# Patient Record
Sex: Female | Born: 1935 | Race: White | Hispanic: No | Smoking: Never smoker
Health system: Southern US, Community
[De-identification: ages and names within clinical notes are randomized; demographics above are authoritative.]

## PROBLEM LIST (undated history)

## (undated) DIAGNOSIS — I10 Essential (primary) hypertension: Secondary | ICD-10-CM

## (undated) DIAGNOSIS — F419 Anxiety disorder, unspecified: Secondary | ICD-10-CM

## (undated) DIAGNOSIS — C55 Malignant neoplasm of uterus, part unspecified: Secondary | ICD-10-CM

## (undated) DIAGNOSIS — E119 Type 2 diabetes mellitus without complications: Secondary | ICD-10-CM

## (undated) DIAGNOSIS — M1990 Unspecified osteoarthritis, unspecified site: Secondary | ICD-10-CM

## (undated) DIAGNOSIS — H269 Unspecified cataract: Secondary | ICD-10-CM

## (undated) DIAGNOSIS — I5181 Takotsubo syndrome: Secondary | ICD-10-CM

## (undated) HISTORY — DX: Takotsubo syndrome: I51.81

## (undated) HISTORY — DX: Unspecified osteoarthritis, unspecified site: M19.90

## (undated) HISTORY — PX: ABDOMINAL HYSTERECTOMY: SHX81

## (undated) HISTORY — DX: Type 2 diabetes mellitus without complications: E11.9

## (undated) HISTORY — DX: Anxiety disorder, unspecified: F41.9

## (undated) HISTORY — PX: APPENDECTOMY: SHX54

---

## 2001-10-01 ENCOUNTER — Ambulatory Visit (HOSPITAL_COMMUNITY): Admission: RE | Admit: 2001-10-01 | Discharge: 2001-10-01 | Payer: Self-pay | Admitting: Obstetrics and Gynecology

## 2001-10-01 ENCOUNTER — Encounter: Payer: Self-pay | Admitting: Unknown Physician Specialty

## 2001-10-28 ENCOUNTER — Encounter: Admission: RE | Admit: 2001-10-28 | Discharge: 2001-10-29 | Payer: Self-pay | Admitting: Unknown Physician Specialty

## 2001-12-09 ENCOUNTER — Encounter: Admission: RE | Admit: 2001-12-09 | Discharge: 2001-12-18 | Payer: Self-pay | Admitting: Unknown Physician Specialty

## 2002-08-05 ENCOUNTER — Encounter: Payer: Self-pay | Admitting: Unknown Physician Specialty

## 2002-08-05 ENCOUNTER — Ambulatory Visit (HOSPITAL_COMMUNITY): Admission: RE | Admit: 2002-08-05 | Discharge: 2002-08-05 | Payer: Self-pay | Admitting: Unknown Physician Specialty

## 2004-04-20 ENCOUNTER — Ambulatory Visit: Payer: Self-pay | Admitting: Family Medicine

## 2005-06-12 DIAGNOSIS — I5181 Takotsubo syndrome: Secondary | ICD-10-CM

## 2005-06-12 HISTORY — DX: Takotsubo syndrome: I51.81

## 2006-03-13 ENCOUNTER — Ambulatory Visit: Payer: Self-pay | Admitting: Cardiology

## 2006-03-14 ENCOUNTER — Ambulatory Visit: Payer: Self-pay | Admitting: Cardiology

## 2006-03-14 ENCOUNTER — Observation Stay (HOSPITAL_COMMUNITY): Admission: RE | Admit: 2006-03-14 | Discharge: 2006-03-15 | Payer: Self-pay | Admitting: Cardiology

## 2006-04-05 ENCOUNTER — Ambulatory Visit: Payer: Self-pay | Admitting: Cardiology

## 2007-06-26 ENCOUNTER — Ambulatory Visit (HOSPITAL_COMMUNITY): Admission: RE | Admit: 2007-06-26 | Discharge: 2007-06-26 | Payer: Self-pay | Admitting: Neurosurgery

## 2007-07-24 ENCOUNTER — Ambulatory Visit (HOSPITAL_COMMUNITY): Admission: RE | Admit: 2007-07-24 | Discharge: 2007-07-24 | Payer: Self-pay | Admitting: Internal Medicine

## 2008-04-30 ENCOUNTER — Ambulatory Visit (HOSPITAL_COMMUNITY): Admission: RE | Admit: 2008-04-30 | Discharge: 2008-04-30 | Payer: Self-pay | Admitting: Internal Medicine

## 2009-04-22 ENCOUNTER — Ambulatory Visit (HOSPITAL_COMMUNITY): Admission: RE | Admit: 2009-04-22 | Discharge: 2009-04-22 | Payer: Self-pay | Admitting: Internal Medicine

## 2010-05-23 ENCOUNTER — Ambulatory Visit (HOSPITAL_COMMUNITY)
Admission: RE | Admit: 2010-05-23 | Discharge: 2010-05-23 | Payer: Self-pay | Source: Home / Self Care | Attending: Internal Medicine | Admitting: Internal Medicine

## 2010-07-03 ENCOUNTER — Encounter: Payer: Self-pay | Admitting: Internal Medicine
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
## 2010-10-28 NOTE — Assessment & Plan Note (Signed)
[facility]                            [facility]ICE NOTE   NAME:Villarreal, CORIANNA AVALLONE                     MRN:          [id number]  DATE:04/05/2006                            DOB:          1931/07/13    REASON FOR OFFICE VISIT:  Post-hospital followup.   [name] is a delightful 77 year old female, whom we saw here initially at  [facility] for evaluation of chest pain and abnormal cardiac markers,  subsequently diagnosed with Takotsubo cardiomyopathy.  She had a peak  troponin of 0.55 with normal total CPKs.  Coronary angiography revealed  normal coronary arteries, with an ejection fraction of 18%, with akinesis of  everything except the base.   Patient was discharged on Coreg and low-dose aspirin.  She had a followup  echocardiogram just 3 days ago, reviewed by Dr. Myrtis Ser, revealing  normalization of ejection fraction (60%), as well as normalization of the  wall motion.  Mild mitral and tricuspid regurgitation was noted.   Patient denies any symptoms of chest pain, dyspnea, PND, orthopnea, lower  extremity edema.  She also notes no complications of the right coronary  incision site.   MEDICATIONS:  1. Aspirin 81 q.d.  2. Carvedilol 3.125 b.i.d.   PHYSICAL EXAMINATION:  VITAL SIGNS:  Blood pressure 120/58, pulse 64,  regular weight 156.  GENERAL:  A 77 year old female, no apparent distress.  NECK:  Palpable carotid pulse without bruits.  LUNGS:  Clear to auscultation in all fields.  HEART:  Regular rate and rhythm (S1, S2), no murmurs, rubs or gallops.  EXTREMITIES:  Right groin stable with no ecchymosis, hematoma, or bruits on  auscultation; intact femoral and distal pulse.  NEURO:  No focal deficits.   IMPRESSION:  1. Status post Takotsubo cardiomyopathy.      a.     Peak troponin 0.55.      b.     Normal coronary arteries/EF 18% by coronary angiography.      c.     Normalization of ejection fraction by recent 2D  echocardiogram.  2. History of syncope.      a.     Most likely vasovagal.   PLAN:  Continue current medication regimen.  Schedule return clinic followup  in 6 months.     ______________________________  Rozell Searing, PA-C    ______________________________  Learta Codding, MD,FACC   GS/MedQ  DD: 04/05/2006  DT: 04/06/2006  Job #: [id number]   cc:   Lia Hopping

## 2011-01-13 ENCOUNTER — Emergency Department (HOSPITAL_COMMUNITY)
Admission: EM | Admit: 2011-01-13 | Discharge: 2011-01-14 | Disposition: A | Discharge status: Home / Self Care | Payer: Medicare Other | Attending: Emergency Medicine | Admitting: Emergency Medicine

## 2011-01-13 DIAGNOSIS — R0602 Shortness of breath: Secondary | ICD-10-CM | POA: Insufficient documentation

## 2011-01-13 DIAGNOSIS — F411 Generalized anxiety disorder: Secondary | ICD-10-CM | POA: Insufficient documentation

## 2011-01-14 ENCOUNTER — Encounter: Payer: Self-pay | Admitting: *Deleted

## 2011-01-14 MED ORDER — HYDROCODONE-ACETAMINOPHEN 5-325 MG PO TABS
1.0000 | ORAL_TABLET | Freq: Once | ORAL | Status: AC
  Administered 2011-01-14: 1 via ORAL
  Filled 2011-01-14: qty 1

## 2011-01-14 MED ORDER — ACETAMINOPHEN 325 MG PO TABS
325.0000 mg | ORAL_TABLET | Freq: Once | ORAL | Status: AC
  Administered 2011-01-14: 325 mg via ORAL
  Filled 2011-01-14: qty 1

## 2011-01-14 MED ORDER — ONDANSETRON 4 MG PO TBDP
ORAL_TABLET | ORAL | Status: AC
  Administered 2011-01-14: 4 mg
  Filled 2011-01-14: qty 1

## 2011-01-14 NOTE — ED Notes (Signed)
Pt was at hospice home visiting her husband and she was told that he was dying. Pt unable to speak or wouldn't speak to me. Pt very upset and states she just wants a shot to go to sleep. Since arrival family has found out that the pts husband has passed away.

## 2011-01-14 NOTE — ED Notes (Signed)
Pt resting.

## 2011-01-18 NOTE — ED Provider Notes (Signed)
History     CSN: [id number] Arrival date & time: 01/13/2011 11:52 PM  Chief Complaint  Patient presents with  . Weakness   HPI Comments: Patient seen at 0006. Patient was at Hospice visiting her husband who has metastatic cancer. He had been doing poorly over the last few days at home and was transferred to the Hospice facility yesterday. When she saw him she realized he was going to die soon and could not deal with watching that happen. She became lightheaded, felt like she was going to pass out. She was hyperventilating (per daughter), crying and collapsed. Patient states she is anxious, tired, and does not know what she will do when her husband dies.   Patient is a 77 y.o. female presenting with anxiety. The history is provided by a parent.  Anxiety This is a new problem. The current episode started less than 1 hour ago. The problem has not changed since onset.Associated symptoms include shortness of breath. Pertinent negatives include no chest pain, no abdominal pain and no headaches. The symptoms are aggravated by nothing. The symptoms are relieved by nothing. She has tried nothing for the symptoms.    History reviewed. No pertinent past medical history.  History reviewed. No pertinent past surgical history.  History reviewed. No pertinent family history.  History  Substance Use Topics  . Smoking status: Never Smoker   . Smokeless tobacco: Not on file  . Alcohol Use: No    OB History    Grav Para Term Preterm Abortions TAB SAB Ect Mult Living                  Review of Systems  Constitutional:       Anxious, tearful  Respiratory: Positive for shortness of breath.   Cardiovascular: Negative for chest pain.  Gastrointestinal: Negative for abdominal pain.  Neurological: Negative for headaches.  Psychiatric/Behavioral: Negative for sleep disturbance.       Anxious, tearful  All other systems reviewed and are negative.    Physical Exam  BP 128/77  Pulse 86   Temp(Src) 98.5 F (36.9 C) (Oral)  Resp 16  SpO2 94%  Physical Exam  Constitutional: She is oriented to person, place, and time. She appears well-developed and well-nourished. She appears distressed.       Anxious tearful woman  HENT:  Head: Normocephalic and atraumatic.  Mouth/Throat: Oropharynx is clear and moist.  Eyes: EOM are normal.  Neck: Normal range of motion. Neck supple.  Cardiovascular: Normal rate and normal heart sounds.   Pulmonary/Chest: Effort normal.  Abdominal: Soft.  Musculoskeletal: Normal range of motion.  Neurological: She is alert and oriented to person, place, and time.  Skin: Skin is warm and dry.  Psychiatric:       Anxious, tearful    ED Course  Procedures  MDM 0015 Advised by family members hat patient's husband had died at Hospice. They have asked that I be the one to tell the patient. 0018 At the bedside with two daughters, I advised the patient that her husband had died. She was appropriately tearful. Talked with patient and daughters. Agreed patient could stay in the ER overnight rather than go home to her house. Patient states it would be the only sleep she has had in several days. Arranged with family to pick her up in the morning. Coordinated with nursing to place a sign on the door "do not disturb without speaking with MD". Patient was settled, given fluids, anxiolytic. 0600 Patient has awaked after  sleeping since shortly after midnight. Spent time at the bedside answering questions re how to accomodate for her recent loss. She will have breakfast and family will be here to take her to the funeral home. 0700 Daughter here to take patient home. Patient is stable.      Nicoletta Dress. Colon Branch, MD 01/18/11 1155

## 2011-06-29 ENCOUNTER — Encounter: Payer: Self-pay | Admitting: Family Medicine

## 2011-06-29 ENCOUNTER — Ambulatory Visit (INDEPENDENT_AMBULATORY_CARE_PROVIDER_SITE_OTHER): Payer: Medicare Other | Admitting: Family Medicine

## 2011-06-29 DIAGNOSIS — E559 Vitamin D deficiency, unspecified: Secondary | ICD-10-CM | POA: Insufficient documentation

## 2011-06-29 DIAGNOSIS — R32 Unspecified urinary incontinence: Secondary | ICD-10-CM

## 2011-06-29 DIAGNOSIS — L309 Dermatitis, unspecified: Secondary | ICD-10-CM | POA: Insufficient documentation

## 2011-06-29 DIAGNOSIS — D649 Anemia, unspecified: Secondary | ICD-10-CM | POA: Insufficient documentation

## 2011-06-29 DIAGNOSIS — I428 Other cardiomyopathies: Secondary | ICD-10-CM

## 2011-06-29 DIAGNOSIS — L259 Unspecified contact dermatitis, unspecified cause: Secondary | ICD-10-CM

## 2011-06-29 DIAGNOSIS — F411 Generalized anxiety disorder: Secondary | ICD-10-CM

## 2011-06-29 DIAGNOSIS — I5181 Takotsubo syndrome: Secondary | ICD-10-CM | POA: Insufficient documentation

## 2011-06-29 DIAGNOSIS — F419 Anxiety disorder, unspecified: Secondary | ICD-10-CM

## 2011-06-29 MED ORDER — TRIAMCINOLONE ACETONIDE 0.1 % EX CREA: TOPICAL_CREAM | Freq: Two times a day (BID) | CUTANEOUS | Status: DC

## 2011-06-29 NOTE — Patient Instructions (Addendum)
I will get your records from Dr. Robynn Pane Continue your current medications Get your labs done before our next visit - do not eat after midnight  Use the cream for your skin  Do not take very hot baths or showers F/U in 6 weeks

## 2011-06-29 NOTE — Assessment & Plan Note (Signed)
Takotsubo Cardiomyopathy noted in 2007, patient was maintained on beta blocker that time. She states that she just stopped this medication because she didn't need it anymore. She's not in a cardiology since then. She's not had chest pain since then. She did have a normal catheterization in 2007. I'll check her baseline labs as well as a fasting lipid panel at her request.

## 2011-06-29 NOTE — Assessment & Plan Note (Signed)
Vitamin D level to be drawn

## 2011-06-29 NOTE — Assessment & Plan Note (Signed)
Patient is currently on when necessary ativan which she rarely uses. I asked him she was started on Paxil secondary to her anxiety and depression after her husband died. She is discontinued this some time ago will monitor

## 2011-06-29 NOTE — Progress Notes (Signed)
  Subjective:    Patient ID: Debra Villarreal, female    DOB: [birth year], 78 y.o.   MRN: [id number]  HPI   Chronic pruritis x 2 months, itches on back , this started a week after her husband died. She thinks she has a rash on her back it was initially concern it was shingles. She states she was given a shot and medication from her previous PCP did but does not know what these medications were.   Anemia- no current medciation    Vitamin D level deficinecy- was on Vitamin D has not had levels checked recently   Was started on Paxil ( patient unsure why she was started on this) - stopped secondary to change in mentation, mood swings   Incontinence- currently on Vesicare, history of bladder prolapse,    Arthritis- currently taking Meloxicam  Anxiety- takes prn lorazepam when "her nerves are bad"  Patient requests lipid panel  Mammogram last year   Review of Systems GEN- denies fatigue, fever, weight loss,weakness, recent illness HEENT- denies eye drainage, change in vision, nasal discharge, CVS- denies chest pain, palpitations RESP- denies SOB, cough, wheeze ABD- denies N/V, change in stools, abd pain GU- denies dysuria, hematuria, dribbling, +incontinence MSK- +joint pain, denies muscle aches, injury Neuro- denies headache, dizziness, syncope, seizure activity Psych- denies sadness, depression      Objective:   Physical Exam GEN- NAD, alert and oriented x3, walks unassisted  HEENT- PERRL, EOMI, non injected sclera, pink conjunctiva, MMM, oropharynx clear, edentulous Neck- Supple, no thryomegaly, no carotid bruit CVS- RRR, no murmur RESP-CTAB ABD- NABS,soft, NT, ND EXT- No edema Pulses- Radial, DP- 2+ Skin- erythema across back, with dry, eczematous scattered lesions, no pustules, no papules noted, no hives Psych- not depressed or anxious      Assessment & Plan:

## 2011-06-29 NOTE — Assessment & Plan Note (Signed)
History of anemia. Will obtain CBC. Reiterated to patient that pending the results of her labs she may or may not be supplement with iron or B12

## 2011-06-29 NOTE — Assessment & Plan Note (Signed)
Eczematous rash on back. Will try steroid mix with Eucerin cream.

## 2011-06-29 NOTE — Assessment & Plan Note (Signed)
Patient has overactive bladder as well as stress incontinence. She will continue her Vesicare.

## 2011-07-11 LAB — BASIC METABOLIC PANEL
CO2: 28 mEq/L (ref 19–32)
Chloride: 103 mEq/L (ref 96–112)
Creat: 0.73 mg/dL (ref 0.50–1.10)
Glucose, Bld: 115 mg/dL — ABNORMAL HIGH (ref 70–99)
Sodium: 139 mEq/L (ref 135–145)

## 2011-07-11 LAB — CBC
Hemoglobin: 13.3 g/dL (ref 12.0–15.0)
MCH: 28.7 pg (ref 26.0–34.0)
MCV: 90.5 fL (ref 78.0–100.0)
Platelets: 216 10*3/uL (ref 150–400)
RBC: 4.64 MIL/uL (ref 3.87–5.11)
WBC: 6.1 10*3/uL (ref 4.0–10.5)

## 2011-07-11 LAB — LIPID PANEL
HDL: 45 mg/dL (ref >39)
LDL Cholesterol: 107 mg/dL — ABNORMAL HIGH (ref 0–99)
Total CHOL/HDL Ratio: 4.2 Ratio

## 2011-07-13 LAB — VITAMIN D 1,25 DIHYDROXY: Vitamin D 1, 25 (OH)2 Total: 49 pg/mL (ref 18–72)

## 2011-08-10 ENCOUNTER — Encounter: Payer: Self-pay | Admitting: Family Medicine

## 2011-08-10 ENCOUNTER — Ambulatory Visit (INDEPENDENT_AMBULATORY_CARE_PROVIDER_SITE_OTHER): Payer: Medicare Other | Admitting: Family Medicine

## 2011-08-10 DIAGNOSIS — F419 Anxiety disorder, unspecified: Secondary | ICD-10-CM

## 2011-08-10 DIAGNOSIS — R32 Unspecified urinary incontinence: Secondary | ICD-10-CM

## 2011-08-10 DIAGNOSIS — L259 Unspecified contact dermatitis, unspecified cause: Secondary | ICD-10-CM

## 2011-08-10 DIAGNOSIS — F411 Generalized anxiety disorder: Secondary | ICD-10-CM

## 2011-08-10 DIAGNOSIS — D649 Anemia, unspecified: Secondary | ICD-10-CM

## 2011-08-10 DIAGNOSIS — I428 Other cardiomyopathies: Secondary | ICD-10-CM

## 2011-08-10 DIAGNOSIS — L309 Dermatitis, unspecified: Secondary | ICD-10-CM

## 2011-08-10 DIAGNOSIS — E781 Pure hyperglyceridemia: Secondary | ICD-10-CM | POA: Insufficient documentation

## 2011-08-10 MED ORDER — SOLIFENACIN SUCCINATE 5 MG PO TABS: 5.0000 mg | ORAL_TABLET | Freq: Every day | ORAL | Status: DC

## 2011-08-10 NOTE — Assessment & Plan Note (Signed)
Stable off medications for a few years now.

## 2011-08-10 NOTE — Assessment & Plan Note (Signed)
Patient has done well without use of any medication. We'll continue to follow.

## 2011-08-10 NOTE — Assessment & Plan Note (Signed)
Hemoglobin is stable. She does not need any medications.

## 2011-08-10 NOTE — Assessment & Plan Note (Signed)
Improved

## 2011-08-10 NOTE — Progress Notes (Signed)
  Subjective:    Patient ID: Debra Villarreal, female    DOB: 1931/08/07, 78 y.o.   MRN: [id number]  HPI Patient presents for medication refill and interim followup  Cardiomyopathy- it is noted in her past PCP has noticed she has not been on any cardiac medications including aspirin or beta blocker. She denies any recent chest pain ,difficulty breathing, leg edema.  Incontinent- she was seen by urology Dr. Frann Rider a few years ago. She was started on Vesicare at that time. She asked for this to be refilled. She is doing well on this medication. She does not want any intervention  Eczema- improved, using Jergens  Anxiety- no recent stress or anxious moments, has not taken ativan, has her dog to keep her company and she visits with her children often  Labs reviewed Review of Systems  GEN- denies fatigue, fever, weight loss,weakness, recent illness HEENT- denies eye drainage, change in vision, nasal discharge, CVS- denies chest pain, palpitations RESP- denies SOB, cough, wheeze ABD- denies N/V, change in stools, abd pain GU- denies dysuria, hematuria, dribbling, +incontinence MSK- +joint pain, denies muscle aches, injury Neuro- denies headache, dizziness, syncope, seizure activity Psych- denies sadness, depression     Objective:   Physical Exam  GEN- NAD, alert and oriented x3,  CVS- RRR, no murmur RESP-CTAB EXT- No edema Psych- not anxious or depressed appearing     Assessment & Plan:  Records to be obtained from the health department regarding her immunizations appear

## 2011-08-10 NOTE — Assessment & Plan Note (Signed)
Vesicare refilled.

## 2011-08-10 NOTE — Patient Instructions (Signed)
Continue your current medications Watch the fried foods  Calcium (1200mg ) and Vitamin D (800IU)- vitamin  If you take Omega 3- Fatty acids (fish oil) take 1000mg  a day  F/U 4 months

## 2011-08-10 NOTE — Assessment & Plan Note (Signed)
Mild elevation in triglycerides. Patient does not need any medications. She states she will avoid any fried foods and she is avoiding meats

## 2011-09-26 ENCOUNTER — Other Ambulatory Visit: Payer: Self-pay

## 2011-09-26 ENCOUNTER — Telehealth: Payer: Self-pay | Admitting: Family Medicine

## 2011-09-26 MED ORDER — MELOXICAM 15 MG PO TABS: 15.0000 mg | ORAL_TABLET | Freq: Every day | ORAL | Status: DC

## 2011-09-26 NOTE — Telephone Encounter (Signed)
Med sent.

## 2011-10-04 NOTE — Patient Instructions (Addendum)
20 Debra Villarreal  10/04/2011   Your procedure is scheduled on:  5.2.13  Report to [facility] at 1100 AM.  Call this number if you have problems the morning of surgery: [phone]   Remember:   Do not eat food:After Midnight.  May have clear liquids:until Midnight .  Clear liquids include soda, tea, black coffee, apple or grape juice, broth.  Take these medicines the morning of surgery with A SIP OF WATER: ativan, vesicare   Do not wear jewelry, make-up or nail polish.  Do not wear lotions, powders, or perfumes. You may wear deodorant.  Do not shave 48 hours prior to surgery.  Do not bring valuables to the hospital.  Contacts, dentures or bridgework may not be worn into surgery.  Leave suitcase in the car. After surgery it may be brought to your room.  For patients admitted to the hospital, checkout time is 11:00 AM the day of discharge.   Patients discharged the day of surgery will not be allowed to drive home.  Name and phone number of your driver: family  Special Instructions: N/A   Please read over the following fact sheets that you were given: Pain Booklet, Surgical Site Infection Prevention, Anesthesia Post-op Instructions and Care and Recovery After Surgery   PATIENT INSTRUCTIONS POST-ANESTHESIA  IMMEDIATELY FOLLOWING SURGERY:  Do not drive or operate machinery for the first twenty four hours after surgery.  Do not make any important decisions for twenty four hours after surgery or while taking narcotic pain medications or sedatives.  If you develop intractable nausea and vomiting or a severe headache please notify your doctor immediately.  FOLLOW-UP:  Please make an appointment with your surgeon as instructed. You do not need to follow up with anesthesia unless specifically instructed to do so.  WOUND CARE INSTRUCTIONS (if applicable):  Keep a dry clean dressing on the anesthesia/puncture wound site if there is drainage.  Once the wound has quit draining you may leave it  open to air.  Generally you should leave the bandage intact for twenty four hours unless there is drainage.  If the epidural site drains for more than 36-48 hours please call the anesthesia department.  QUESTIONS?:  Please feel free to call your physician or the hospital operator if you have any questions, and they will be happy to assist you.     [facility] Anesthesia Department [address] [place] [phone]    Cataract A cataract is a clouding of the lens of the eye. When a lens becomes cloudy, vision is reduced based on the degree and nature of the clouding. Many cataracts reduce vision to some degree. Some cataracts make people more near-sighted as they develop. Other cataracts increase glare. Cataracts that are ignored and become worse can sometimes look white. The white color can be seen through the pupil. CAUSES   Aging. However, cataracts may occur at any age, even in newborns.   Certain drugs.   Trauma to the eye.   Certain diseases such as diabetes.   Specific eye diseases such as chronic inflammation inside the eye or a sudden attack of a rare form of glaucoma.   Inherited or acquired medical problems.  SYMPTOMS   Gradual, progressive drop in vision in the affected eye.   Severe, rapid visual loss. This most often happens when trauma is the cause.  DIAGNOSIS  To detect a cataract, an eye doctor examines the lens. Cataracts are best diagnosed with an exam of the eyes  with the pupils enlarged (dilated) by drops.  TREATMENT  For an early cataract, vision may improve by using different eyeglasses or stronger lighting. If that does not help your vision, surgery is the only effective treatment. A cataract needs to be surgically removed when vision loss interferes with your everyday activities, such as driving, reading, or watching TV. A cataract may also have to be removed if it prevents examination or treatment of another eye problem. Surgery removes the  cloudy lens and usually replaces it with a substitute lens (intraocular lens, IOL).  At a time when both you and your doctor agree, the cataract will be surgically removed. If you have cataracts in both eyes, only one is usually removed at a time. This allows the operated eye to heal and be out of danger from any possible problems after surgery (such as infection or poor wound healing). In rare cases, a cataract may be doing damage to your eye. In these cases, your caregiver may advise surgical removal right away. The vast majority of people who have cataract surgery have better vision afterward. HOME CARE INSTRUCTIONS  If you are not planning surgery, you may be asked to do the following:  Use different eyeglasses.   Use stronger or brighter lighting.   Ask your eye doctor about reducing your medicine dose or changing medicines if it is thought that a medicine caused your cataract. Changing medicines does not make the cataract go away on its own.   Become familiar with your surroundings. Poor vision can lead to injury. Avoid bumping into things on the affected side. You are at a higher risk for tripping or falling.   Exercise extreme care when driving or operating machinery.   Wear sunglasses if you are sensitive to bright light or experiencing problems with glare.  SEEK IMMEDIATE MEDICAL CARE IF:   You have a worsening or sudden vision loss.   You notice redness, swelling, or increasing pain in the eye.   You have a fever.  Document Released: 05/29/2005 Document Revised: 05/18/2011 Document Reviewed: 01/20/2011 [facility] Patient Information 2012 [place], [place].

## 2011-10-05 ENCOUNTER — Encounter (HOSPITAL_COMMUNITY): Payer: Self-pay

## 2011-10-05 ENCOUNTER — Encounter (HOSPITAL_COMMUNITY): Payer: Self-pay | Admitting: Pharmacy Technician

## 2011-10-05 ENCOUNTER — Encounter (HOSPITAL_COMMUNITY)
Admission: RE | Admit: 2011-10-05 | Discharge: 2011-10-05 | Disposition: A | Discharge status: Home / Self Care | Payer: Medicare Other | Source: Ambulatory Visit | Attending: Ophthalmology | Admitting: Ophthalmology

## 2011-10-05 LAB — HEMOGLOBIN AND HEMATOCRIT, BLOOD
HCT: 38.7 % (ref 36.0–46.0)
Hemoglobin: 12.7 g/dL (ref 12.0–15.0)

## 2011-10-05 LAB — BASIC METABOLIC PANEL
BUN: 19 mg/dL (ref 6–23)
Creatinine, Ser: 0.83 mg/dL (ref 0.50–1.10)
GFR calc Af Amer: 77 mL/min — ABNORMAL LOW (ref >90)
GFR calc non Af Amer: 66 mL/min — ABNORMAL LOW (ref >90)

## 2011-10-11 MED ORDER — LIDOCAINE HCL 3.5 % OP GEL
OPHTHALMIC | Status: AC
  Administered 2011-10-12: 1 via OPHTHALMIC
  Filled 2011-10-11: qty 5

## 2011-10-11 MED ORDER — TETRACAINE HCL 0.5 % OP SOLN
OPHTHALMIC | Status: AC
  Administered 2011-10-12: 1 [drp] via OPHTHALMIC
  Filled 2011-10-11: qty 2

## 2011-10-11 MED ORDER — NEOMYCIN-POLYMYXIN-DEXAMETH 3.5-10000-0.1 OP OINT
TOPICAL_OINTMENT | OPHTHALMIC | Status: AC
  Filled 2011-10-11: qty 3.5

## 2011-10-12 ENCOUNTER — Encounter (HOSPITAL_COMMUNITY): Payer: Self-pay | Admitting: *Deleted

## 2011-10-12 ENCOUNTER — Ambulatory Visit (HOSPITAL_COMMUNITY): Payer: Medicare Other | Admitting: Anesthesiology

## 2011-10-12 ENCOUNTER — Encounter (HOSPITAL_COMMUNITY): Payer: Self-pay | Admitting: Anesthesiology

## 2011-10-12 ENCOUNTER — Ambulatory Visit (HOSPITAL_COMMUNITY)
Admission: RE | Admit: 2011-10-12 | Discharge: 2011-10-12 | Disposition: A | Discharge status: Home Health | Payer: Medicare Other | Source: Ambulatory Visit | Attending: Ophthalmology | Admitting: Ophthalmology

## 2011-10-12 ENCOUNTER — Encounter (HOSPITAL_COMMUNITY)
Admission: RE | Disposition: A | Discharge status: Home Health | Payer: Self-pay | Source: Ambulatory Visit | Attending: Ophthalmology

## 2011-10-12 DIAGNOSIS — Z01810 Encounter for preprocedural cardiovascular examination: Secondary | ICD-10-CM | POA: Insufficient documentation

## 2011-10-12 DIAGNOSIS — H2510 Age-related nuclear cataract, unspecified eye: Secondary | ICD-10-CM | POA: Insufficient documentation

## 2011-10-12 DIAGNOSIS — Z01812 Encounter for preprocedural laboratory examination: Secondary | ICD-10-CM | POA: Insufficient documentation

## 2011-10-12 HISTORY — PX: CATARACT EXTRACTION W/PHACO: SHX586

## 2011-10-12 SURGERY — PHACOEMULSIFICATION, CATARACT, WITH IOL INSERTION
Anesthesia: Monitor Anesthesia Care | Site: Eye | Laterality: Left | Wound class: Clean

## 2011-10-12 MED ORDER — MIDAZOLAM HCL 2 MG/2ML IJ SOLN
INTRAMUSCULAR | Status: AC
  Administered 2011-10-12: 2 mg via INTRAVENOUS
  Filled 2011-10-12: qty 2

## 2011-10-12 MED ORDER — LIDOCAINE HCL 3.5 % OP GEL
1.0000 "application " | Freq: Once | OPHTHALMIC | Status: AC
  Administered 2011-10-12: 1 via OPHTHALMIC

## 2011-10-12 MED ORDER — EPINEPHRINE HCL 1 MG/ML IJ SOLN
INTRAOCULAR | Status: DC | PRN
  Administered 2011-10-12: 13:00:00

## 2011-10-12 MED ORDER — LIDOCAINE HCL (PF) 1 % IJ SOLN
INTRAMUSCULAR | Status: AC
  Filled 2011-10-12: qty 2

## 2011-10-12 MED ORDER — LIDOCAINE 3.5 % OP GEL OPTIME - NO CHARGE
OPHTHALMIC | Status: DC | PRN
  Administered 2011-10-12: 1 [drp] via OPHTHALMIC

## 2011-10-12 MED ORDER — LACTATED RINGERS IV SOLN
INTRAVENOUS | Status: DC | PRN
  Administered 2011-10-12: 12:00:00 via INTRAVENOUS

## 2011-10-12 MED ORDER — EPINEPHRINE HCL 1 MG/ML IJ SOLN
INTRAMUSCULAR | Status: AC
  Filled 2011-10-12: qty 1

## 2011-10-12 MED ORDER — MIDAZOLAM HCL 2 MG/2ML IJ SOLN
INTRAMUSCULAR | Status: AC
  Filled 2011-10-12: qty 2

## 2011-10-12 MED ORDER — PHENYLEPHRINE HCL 2.5 % OP SOLN
1.0000 [drp] | OPHTHALMIC | Status: AC
  Administered 2011-10-12 (×3): 1 [drp] via OPHTHALMIC

## 2011-10-12 MED ORDER — BSS IO SOLN
INTRAOCULAR | Status: DC | PRN
  Administered 2011-10-12: 15 mL via INTRAOCULAR

## 2011-10-12 MED ORDER — MIDAZOLAM HCL 2 MG/2ML IJ SOLN: 1.0000 mg | INTRAMUSCULAR | Status: DC | PRN

## 2011-10-12 MED ORDER — NEOMYCIN-POLYMYXIN-DEXAMETH 0.1 % OP OINT
TOPICAL_OINTMENT | OPHTHALMIC | Status: DC | PRN
  Administered 2011-10-12: 1 via OPHTHALMIC

## 2011-10-12 MED ORDER — PROVISC 10 MG/ML IO SOLN
INTRAOCULAR | Status: DC | PRN
  Administered 2011-10-12: 8.5 mg via INTRAOCULAR

## 2011-10-12 MED ORDER — LIDOCAINE HCL (PF) 1 % IJ SOLN
INTRAMUSCULAR | Status: DC | PRN
  Administered 2011-10-12: .7 mL

## 2011-10-12 MED ORDER — LACTATED RINGERS IV SOLN
INTRAVENOUS | Status: DC
  Administered 2011-10-12: 1000 mL via INTRAVENOUS

## 2011-10-12 MED ORDER — POVIDONE-IODINE 5 % OP SOLN
OPHTHALMIC | Status: DC | PRN
  Administered 2011-10-12: 1 via OPHTHALMIC

## 2011-10-12 MED ORDER — CYCLOPENTOLATE-PHENYLEPHRINE 0.2-1 % OP SOLN
OPHTHALMIC | Status: AC
  Administered 2011-10-12: 1 [drp] via OPHTHALMIC
  Filled 2011-10-12: qty 2

## 2011-10-12 MED ORDER — PHENYLEPHRINE HCL 2.5 % OP SOLN
OPHTHALMIC | Status: AC
  Administered 2011-10-12: 1 [drp] via OPHTHALMIC
  Filled 2011-10-12: qty 2

## 2011-10-12 MED ORDER — CYCLOPENTOLATE-PHENYLEPHRINE 0.2-1 % OP SOLN
1.0000 [drp] | OPHTHALMIC | Status: AC
  Administered 2011-10-12 (×3): 1 [drp] via OPHTHALMIC

## 2011-10-12 MED ORDER — MIDAZOLAM HCL 2 MG/2ML IJ SOLN
1.0000 mg | INTRAMUSCULAR | Status: DC | PRN
  Administered 2011-10-12 (×2): 2 mg via INTRAVENOUS

## 2011-10-12 MED ORDER — TETRACAINE HCL 0.5 % OP SOLN
1.0000 [drp] | OPHTHALMIC | Status: AC
  Administered 2011-10-12 (×3): 1 [drp] via OPHTHALMIC

## 2011-10-12 MED ORDER — LACTATED RINGERS IV SOLN: INTRAVENOUS | Status: DC

## 2011-10-12 SURGICAL SUPPLY — 30 items
CAPSULAR TENSION RING-AMO (OPHTHALMIC RELATED) IMPLANT
CLOTH BEACON ORANGE TIMEOUT ST (SAFETY) ×1 IMPLANT
EYE SHIELD UNIVERSAL CLEAR (GAUZE/BANDAGES/DRESSINGS) ×2 IMPLANT
GLOVE BIO SURGEON STRL SZ 6.5 (GLOVE) IMPLANT
GLOVE BIOGEL PI IND STRL 6.5 (GLOVE) IMPLANT
GLOVE BIOGEL PI IND STRL 7.0 (GLOVE) ×1 IMPLANT
GLOVE BIOGEL PI IND STRL 7.5 (GLOVE) IMPLANT
GLOVE BIOGEL PI INDICATOR 6.5 (GLOVE) ×1
GLOVE BIOGEL PI INDICATOR 7.0 (GLOVE) ×1
GLOVE BIOGEL PI INDICATOR 7.5 (GLOVE)
GLOVE ECLIPSE 6.5 STRL STRAW (GLOVE) IMPLANT
GLOVE ECLIPSE 7.0 STRL STRAW (GLOVE) IMPLANT
GLOVE ECLIPSE 7.5 STRL STRAW (GLOVE) IMPLANT
GLOVE EXAM NITRILE LRG STRL (GLOVE) IMPLANT
GLOVE EXAM NITRILE MD LF STRL (GLOVE) IMPLANT
GLOVE SKINSENSE NS SZ6.5 (GLOVE)
GLOVE SKINSENSE NS SZ7.0 (GLOVE)
GLOVE SKINSENSE STRL SZ6.5 (GLOVE) IMPLANT
GLOVE SKINSENSE STRL SZ7.0 (GLOVE) IMPLANT
KIT VITRECTOMY (OPHTHALMIC RELATED) IMPLANT
PAD ARMBOARD 7.5X6 YLW CONV (MISCELLANEOUS) ×2 IMPLANT
PROC W NO LENS (INTRAOCULAR LENS)
PROC W SPEC LENS (INTRAOCULAR LENS)
PROCESS W NO LENS (INTRAOCULAR LENS) IMPLANT
PROCESS W SPEC LENS (INTRAOCULAR LENS) IMPLANT
RING MALYGIN (MISCELLANEOUS) IMPLANT
SIGHTPATH CAT PROC W REG LENS (Ophthalmic Related) ×2 IMPLANT
SYR TB 1ML LL NO SAFETY (SYRINGE) ×1 IMPLANT
VISCOELASTIC ADDITIONAL (OPHTHALMIC RELATED) IMPLANT
WATER STERILE IRR 250ML POUR (IV SOLUTION) ×1 IMPLANT

## 2011-10-12 NOTE — Op Note (Signed)
NAMESHAQUIRA, MOROZ NO.:  [id number]  MEDICAL RECORD NO.:  [id number]  LOCATION:  [facility]                          FACILITY:  [facility]  PHYSICIAN:  Susanne Greenhouse, MD       DATE OF BIRTH:  1931/11/07  DATE OF PROCEDURE:  10/12/2011 DATE OF DISCHARGE:  10/12/2011                              OPERATIVE REPORT   PREOPERATIVE DIAGNOSIS:  Nuclear cataract, left eye, diagnosis code 366.16.  POSTOPERATIVE DIAGNOSIS:  Nuclear cataract, left eye, diagnosis code 366.16.  OPERATION PERFORMED:  Phacoemulsification with posterior chamber intraocular lens implantation, left eye.  SURGEON:  Susanne Greenhouse, MD  ANESTHESIA:  Local with MAC.  OPERATIVE SUMMARY:  In the preoperative area, dilating drops were placed into the left eye.  The patient was then brought into the operating room where she was placed under general anesthesia.  The eye was then prepped and draped.  Beginning with a #75 blade, a paracentesis port was made at the surgeon's 2 o'clock position.  The anterior chamber was then filled with a 1% nonpreserved lidocaine solution with epinephrine.  This was followed by Viscoat to deepen the chamber.  A small fornix-based peritomy was performed superiorly.  Next, a single iris hook was placed through the limbus superiorly.  A 2.4-mm keratome blade was then used to make a clear corneal incision over the iris hook.  A bent cystotome needle and Utrata forceps were used to create a continuous tear capsulotomy.  Hydrodissection was performed using balanced salt solution on a fine cannula.  The lens nucleus was then removed using phacoemulsification in a quadrant cracking technique.  The cortical material was then removed with irrigation and aspiration.  The capsular bag and anterior chamber were refilled with Provisc.  The wound was widened to approximately 3 mm and a posterior chamber intraocular lens was placed into the capsular bag without difficulty using an [employer]  lens injecting system.  A single 10-0 nylon suture was then used to close the incision as well as stromal hydration.  The Provisc was removed from the anterior chamber and capsular bag with irrigation and aspiration.  At this point, the wounds were tested for leak, which were negative.  The anterior chamber remained deep and stable.  The patient tolerated the procedure well.  There were no operative complications, and she awoke from general anesthesia without problem.  No surgical specimens.  Prosthetic device used is a Lenstec posterior chamber lens, model Softec HD, power of 20.5, serial number is [id number].          ______________________________ Susanne Greenhouse, MD     KEH/MEDQ  D:  10/12/2011  T:  10/12/2011  Job:  [id number]

## 2011-10-12 NOTE — Anesthesia Postprocedure Evaluation (Signed)
  Anesthesia Post-op Note  Patient: Debra Villarreal  Procedure(s) Performed: Procedure(s) (LRB): CATARACT EXTRACTION PHACO AND INTRAOCULAR LENS PLACEMENT (IOC) (Left)  Patient Location: PACU and Short Stay  Anesthesia Type: MAC  Level of Consciousness: awake, alert  and oriented  Airway and Oxygen Therapy: Patient Spontanous Breathing  Post-op Pain: none  Post-op Assessment: Post-op Vital signs reviewed, Patient's Cardiovascular Status Stable, Respiratory Function Stable, Patent Airway, No signs of Nausea or vomiting and Pain level controlled  Post-op Vital Signs: Reviewed and stable  Complications: No apparent anesthesia complications

## 2011-10-12 NOTE — Transfer of Care (Signed)
Immediate Anesthesia Transfer of Care Note  Patient: Debra Villarreal  Procedure(s) Performed: Procedure(s) (LRB): CATARACT EXTRACTION PHACO AND INTRAOCULAR LENS PLACEMENT (IOC) (Left)  Patient Location: PACU and Short Stay  Anesthesia Type: MAC  Level of Consciousness: awake, alert  and oriented  Airway & Oxygen Therapy: Patient Spontanous Breathing  Post-op Assessment: Report given to PACU RN  Post vital signs: stable  Complications: No apparent anesthesia complications

## 2011-10-12 NOTE — H&P (Signed)
I have reviewed the H&P, the patient was re-examined, and I have identified no interval changes in medical condition and plan of care since the history and physical of record  

## 2011-10-12 NOTE — Preoperative (Signed)
Beta Blockers   Reason not to administer Beta Blockers:Not Applicable 

## 2011-10-12 NOTE — Brief Op Note (Signed)
Pre-Op Dx: Cataract OS Post-Op Dx: Cataract OS Surgeon: Tasheema Perrone Anesthesia: Topical with MAC Surgery: Cataract Extraction with Intraocular lens Implant OS Implant: Lenstec, Model Softec HD Specimen: None Complications: None 

## 2011-10-12 NOTE — Discharge Instructions (Signed)
Debra Villarreal  10/12/2011     Instructions  1. Use medications as Instructed.  Shake well before use. Wait 5 minutes between drops.  {OPHTHALMIC ANTIBIOTICS:22167} 4 times a day x 1 week.  {OPHTHALMIC ANTI-INFLAMMATORY:22168} 2 times a day x 4 weeks.  {OPHTHALMIC STEROID:22169} 4 times a day - week 1   3 times a day - Week 2, 2 times a day- Week 3, 1 time a day - Week 4.  2. Do not rub the operative eye. Do not swim underwater for 2 weeks.  3. You may remove the clear shield and resume your normal activities the day after  Surgery. Your eyes may feel more comfortable if you wear dark glasses outside.  4. Call our office at [phone] if you have sudden change in vision, extreme redness or pain. Some fluctuation in vision is normal after surgery. If you have an emergency after hours, call Dr. Alto Denver at [phone].  5. It is important that you attend all of your follow-up appointments.        Follow-up:{follow up:32580} with [name], MD.   Dr. Lahoma Crocker: [phone]  Dr. Lita Mains: [phone]  Dr. Alto Denver: [phone]   If you find that you cannot contact your physician, but feel that your signs and   Symptoms warrant a physician's attention, call the Emergency Room at   [phone].   Other{NA AND AYTKZSWF:09323}.

## 2011-10-12 NOTE — Anesthesia Preprocedure Evaluation (Signed)
Anesthesia Evaluation  Patient identified by MRN, date of birth, ID band Patient awake    Reviewed: Allergy & Precautions, H&P , NPO status , Patient's Chart, lab work & pertinent test results  Airway Mallampati: II      Dental  (+) Edentulous Upper and Edentulous Lower   Pulmonary neg pulmonary ROS,  breath sounds clear to auscultation        Cardiovascular negative cardio ROS  Rhythm:Regular     Neuro/Psych    GI/Hepatic   Endo/Other    Renal/GU      Musculoskeletal   Abdominal   Peds  Hematology   Anesthesia Other Findings   Reproductive/Obstetrics                           Anesthesia Physical Anesthesia Plan  ASA: II  Anesthesia Plan: MAC   Post-op Pain Management:    Induction: Intravenous  Airway Management Planned: Nasal Cannula  Additional Equipment:   Intra-op Plan:   Post-operative Plan:   Informed Consent: I have reviewed the patients History and Physical, chart, labs and discussed the procedure including the risks, benefits and alternatives for the proposed anesthesia with the patient or authorized representative who has indicated his/her understanding and acceptance.     Plan Discussed with:   Anesthesia Plan Comments:         Anesthesia Quick Evaluation

## 2011-10-16 ENCOUNTER — Encounter (HOSPITAL_COMMUNITY): Payer: Self-pay | Admitting: Ophthalmology

## 2011-10-19 ENCOUNTER — Encounter (HOSPITAL_COMMUNITY)
Admission: RE | Admit: 2011-10-19 | Discharge: 2011-10-19 | Payer: Medicare Other | Source: Ambulatory Visit | Admitting: Ophthalmology

## 2011-10-19 ENCOUNTER — Encounter (HOSPITAL_COMMUNITY): Payer: Self-pay

## 2011-10-19 MED ORDER — ONDANSETRON HCL 4 MG/2ML IJ SOLN: 4.0000 mg | Freq: Once | INTRAMUSCULAR | Status: DC | PRN

## 2011-10-19 MED ORDER — FENTANYL CITRATE 0.05 MG/ML IJ SOLN: 25.0000 ug | INTRAMUSCULAR | Status: DC | PRN

## 2011-10-20 MED ORDER — LIDOCAINE HCL 3.5 % OP GEL
OPHTHALMIC | Status: AC
  Filled 2011-10-20: qty 5

## 2011-10-20 MED ORDER — PHENYLEPHRINE HCL 2.5 % OP SOLN
OPHTHALMIC | Status: AC
  Filled 2011-10-20: qty 2

## 2011-10-20 MED ORDER — NEOMYCIN-POLYMYXIN-DEXAMETH 3.5-10000-0.1 OP OINT
TOPICAL_OINTMENT | OPHTHALMIC | Status: AC
  Filled 2011-10-20: qty 3.5

## 2011-10-20 MED ORDER — CYCLOPENTOLATE-PHENYLEPHRINE 0.2-1 % OP SOLN
OPHTHALMIC | Status: AC
  Filled 2011-10-20: qty 2

## 2011-10-20 MED ORDER — TETRACAINE HCL 0.5 % OP SOLN
OPHTHALMIC | Status: AC
  Filled 2011-10-20: qty 2

## 2011-10-20 MED ORDER — LIDOCAINE HCL (PF) 1 % IJ SOLN
INTRAMUSCULAR | Status: AC
  Filled 2011-10-20: qty 2

## 2011-10-23 ENCOUNTER — Ambulatory Visit (HOSPITAL_COMMUNITY)
Admission: RE | Admit: 2011-10-23 | Discharge: 2011-10-23 | Disposition: A | Discharge status: Home / Self Care | Payer: Medicare Other | Source: Ambulatory Visit | Attending: Ophthalmology | Admitting: Ophthalmology

## 2011-10-23 ENCOUNTER — Ambulatory Visit (HOSPITAL_COMMUNITY): Payer: Medicare Other | Admitting: Anesthesiology

## 2011-10-23 ENCOUNTER — Encounter (HOSPITAL_COMMUNITY): Payer: Self-pay | Admitting: *Deleted

## 2011-10-23 ENCOUNTER — Encounter (HOSPITAL_COMMUNITY)
Admission: RE | Disposition: A | Discharge status: Home / Self Care | Payer: Self-pay | Source: Ambulatory Visit | Attending: Ophthalmology

## 2011-10-23 ENCOUNTER — Encounter (HOSPITAL_COMMUNITY): Payer: Self-pay | Admitting: Anesthesiology

## 2011-10-23 DIAGNOSIS — H2181 Floppy iris syndrome: Secondary | ICD-10-CM | POA: Insufficient documentation

## 2011-10-23 DIAGNOSIS — H2589 Other age-related cataract: Secondary | ICD-10-CM | POA: Insufficient documentation

## 2011-10-23 HISTORY — PX: CATARACT EXTRACTION W/PHACO: SHX586

## 2011-10-23 SURGERY — PHACOEMULSIFICATION, CATARACT, WITH IOL INSERTION
Anesthesia: Monitor Anesthesia Care | Site: Eye | Laterality: Right | Wound class: Clean

## 2011-10-23 MED ORDER — PHENYLEPHRINE HCL 2.5 % OP SOLN
1.0000 [drp] | OPHTHALMIC | Status: AC
  Administered 2011-10-23 (×3): 1 [drp] via OPHTHALMIC

## 2011-10-23 MED ORDER — CYCLOPENTOLATE-PHENYLEPHRINE 0.2-1 % OP SOLN
OPHTHALMIC | Status: AC
  Filled 2011-10-23: qty 2

## 2011-10-23 MED ORDER — ONDANSETRON HCL 4 MG/2ML IJ SOLN: 4.0000 mg | Freq: Once | INTRAMUSCULAR | Status: DC | PRN

## 2011-10-23 MED ORDER — PHENYLEPHRINE HCL 2.5 % OP SOLN
OPHTHALMIC | Status: AC
  Filled 2011-10-23: qty 2

## 2011-10-23 MED ORDER — FENTANYL CITRATE 0.05 MG/ML IJ SOLN: 25.0000 ug | INTRAMUSCULAR | Status: DC | PRN

## 2011-10-23 MED ORDER — POVIDONE-IODINE 5 % OP SOLN
OPHTHALMIC | Status: DC | PRN
  Administered 2011-10-23: 1 via OPHTHALMIC

## 2011-10-23 MED ORDER — TETRACAINE HCL 0.5 % OP SOLN
OPHTHALMIC | Status: AC
  Filled 2011-10-23: qty 2

## 2011-10-23 MED ORDER — EPINEPHRINE HCL 1 MG/ML IJ SOLN
INTRAOCULAR | Status: DC | PRN
  Administered 2011-10-23: 10:00:00

## 2011-10-23 MED ORDER — NEOMYCIN-POLYMYXIN-DEXAMETH 0.1 % OP OINT
TOPICAL_OINTMENT | OPHTHALMIC | Status: DC | PRN
  Administered 2011-10-23: 1 via OPHTHALMIC

## 2011-10-23 MED ORDER — LIDOCAINE HCL 3.5 % OP GEL: 1.0000 "application " | Freq: Once | OPHTHALMIC | Status: DC

## 2011-10-23 MED ORDER — FLURBIPROFEN SODIUM 0.03 % OP SOLN
OPHTHALMIC | Status: AC
  Filled 2011-10-23: qty 2.5

## 2011-10-23 MED ORDER — MIDAZOLAM HCL 2 MG/2ML IJ SOLN
INTRAMUSCULAR | Status: AC
  Administered 2011-10-23: 2 mg via INTRAVENOUS
  Filled 2011-10-23: qty 2

## 2011-10-23 MED ORDER — EPINEPHRINE HCL 1 MG/ML IJ SOLN
INTRAMUSCULAR | Status: AC
  Filled 2011-10-23: qty 1

## 2011-10-23 MED ORDER — TETRACAINE HCL 0.5 % OP SOLN
1.0000 [drp] | OPHTHALMIC | Status: AC
  Administered 2011-10-23 (×3): 1 [drp] via OPHTHALMIC

## 2011-10-23 MED ORDER — MIDAZOLAM HCL 2 MG/2ML IJ SOLN
1.0000 mg | INTRAMUSCULAR | Status: DC | PRN
  Administered 2011-10-23: 2 mg via INTRAVENOUS

## 2011-10-23 MED ORDER — BSS IO SOLN
INTRAOCULAR | Status: DC | PRN
  Administered 2011-10-23: 15 mL via INTRAOCULAR

## 2011-10-23 MED ORDER — LACTATED RINGERS IV SOLN
INTRAVENOUS | Status: DC
  Administered 2011-10-23: 10:00:00 via INTRAVENOUS

## 2011-10-23 MED ORDER — LIDOCAINE HCL (PF) 1 % IJ SOLN
INTRAMUSCULAR | Status: DC | PRN
  Administered 2011-10-23: .5 mL

## 2011-10-23 MED ORDER — PROVISC 10 MG/ML IO SOLN
INTRAOCULAR | Status: DC | PRN
  Administered 2011-10-23: 8.5 mg via INTRAOCULAR

## 2011-10-23 MED ORDER — LIDOCAINE 3.5 % OP GEL OPTIME - NO CHARGE
OPHTHALMIC | Status: DC | PRN
  Administered 2011-10-23: 2 [drp] via OPHTHALMIC

## 2011-10-23 MED ORDER — CYCLOPENTOLATE-PHENYLEPHRINE 0.2-1 % OP SOLN
1.0000 [drp] | OPHTHALMIC | Status: AC
  Administered 2011-10-23 (×3): 1 [drp] via OPHTHALMIC

## 2011-10-23 SURGICAL SUPPLY — 32 items
CAPSULAR TENSION RING-AMO (OPHTHALMIC RELATED) IMPLANT
CLOTH BEACON ORANGE TIMEOUT ST (SAFETY) ×1 IMPLANT
EYE SHIELD UNIVERSAL CLEAR (GAUZE/BANDAGES/DRESSINGS) ×1 IMPLANT
GLOVE BIO SURGEON STRL SZ 6.5 (GLOVE) IMPLANT
GLOVE BIOGEL PI IND STRL 6.5 (GLOVE) ×1 IMPLANT
GLOVE BIOGEL PI IND STRL 7.0 (GLOVE) IMPLANT
GLOVE BIOGEL PI IND STRL 7.5 (GLOVE) IMPLANT
GLOVE BIOGEL PI INDICATOR 6.5 (GLOVE) ×1
GLOVE BIOGEL PI INDICATOR 7.0 (GLOVE)
GLOVE BIOGEL PI INDICATOR 7.5 (GLOVE)
GLOVE ECLIPSE 6.5 STRL STRAW (GLOVE) IMPLANT
GLOVE ECLIPSE 7.0 STRL STRAW (GLOVE) IMPLANT
GLOVE ECLIPSE 7.5 STRL STRAW (GLOVE) IMPLANT
GLOVE EXAM NITRILE LRG STRL (GLOVE) IMPLANT
GLOVE EXAM NITRILE MD LF STRL (GLOVE) ×1 IMPLANT
GLOVE SKINSENSE NS SZ6.5 (GLOVE)
GLOVE SKINSENSE NS SZ7.0 (GLOVE)
GLOVE SKINSENSE STRL SZ6.5 (GLOVE) IMPLANT
GLOVE SKINSENSE STRL SZ7.0 (GLOVE) IMPLANT
KIT VITRECTOMY (OPHTHALMIC RELATED) IMPLANT
PAD ARMBOARD 7.5X6 YLW CONV (MISCELLANEOUS) ×2 IMPLANT
PROC W NO LENS (INTRAOCULAR LENS)
PROC W SPEC LENS (INTRAOCULAR LENS)
PROCESS W NO LENS (INTRAOCULAR LENS) IMPLANT
PROCESS W SPEC LENS (INTRAOCULAR LENS) IMPLANT
RING MALYGIN (MISCELLANEOUS) IMPLANT
SIGHTPATH CAT PROC W REG LENS (Ophthalmic Related) ×2 IMPLANT
SYR TB 1ML LL NO SAFETY (SYRINGE) ×1 IMPLANT
TAPE SURG TRANSPORE 1 IN (GAUZE/BANDAGES/DRESSINGS) IMPLANT
TAPE SURGICAL TRANSPORE 1 IN (GAUZE/BANDAGES/DRESSINGS) ×1
VISCOELASTIC ADDITIONAL (OPHTHALMIC RELATED) IMPLANT
WATER STERILE IRR 250ML POUR (IV SOLUTION) ×2 IMPLANT

## 2011-10-23 NOTE — Transfer of Care (Signed)
Immediate Anesthesia Transfer of Care Note  Patient: Debra Villarreal  Procedure(s) Performed: Procedure(s) (LRB): CATARACT EXTRACTION PHACO AND INTRAOCULAR LENS PLACEMENT (IOC) (Right)  Patient Location: PACU and Short Stay  Anesthesia Type: MAC  Level of Consciousness: awake, alert , oriented and patient cooperative  Airway & Oxygen Therapy: Patient Spontanous Breathing  Post-op Assessment: Report given to PACU RN, Post -op Vital signs reviewed and stable and Patient moving all extremities  Post vital signs: Reviewed and stable  Complications: No apparent anesthesia complications

## 2011-10-23 NOTE — Anesthesia Postprocedure Evaluation (Signed)
  Anesthesia Post-op Note  Patient: Debra Villarreal  Procedure(s) Performed: Procedure(s) (LRB): CATARACT EXTRACTION PHACO AND INTRAOCULAR LENS PLACEMENT (IOC) (Right)  Patient Location: PACU and Short Stay  Anesthesia Type: MAC  Level of Consciousness: awake, alert , oriented and patient cooperative  Airway and Oxygen Therapy: Patient Spontanous Breathing  Post-op Pain: none  Post-op Assessment: Post-op Vital signs reviewed, Patient's Cardiovascular Status Stable, Respiratory Function Stable, Patent Airway, No signs of Nausea or vomiting and Pain level controlled  Post-op Vital Signs: Reviewed and stable  Complications: No apparent anesthesia complications

## 2011-10-23 NOTE — Brief Op Note (Signed)
Pre-Op Dx: Cataract OD Post-Op Dx: Cataract OD Surgeon: Elmor Kost Anesthesia: Topical with MAC Surgery: Cataract Extraction with Intraocular lens Implant OD Implant: Lenstec, Model Softec HD Blood Loss: None Specimen: None Complications: None 

## 2011-10-23 NOTE — H&P (Signed)
I have reviewed the H&P, the patient was re-examined, and I have identified no interval changes in medical condition and plan of care since the history and physical of record  

## 2011-10-23 NOTE — Anesthesia Preprocedure Evaluation (Signed)
Anesthesia Evaluation  Patient identified by MRN, date of birth, ID band Patient awake    Reviewed: Allergy & Precautions, H&P , NPO status , Patient's Chart, lab work & pertinent test results  Airway Mallampati: II      Dental  (+) Edentulous Upper and Edentulous Lower   Pulmonary neg pulmonary ROS,  breath sounds clear to auscultation        Cardiovascular negative cardio ROS  Rhythm:Regular     Neuro/Psych    GI/Hepatic   Endo/Other    Renal/GU      Musculoskeletal   Abdominal   Peds  Hematology   Anesthesia Other Findings   Reproductive/Obstetrics                           Anesthesia Physical Anesthesia Plan  ASA: II  Anesthesia Plan: MAC   Post-op Pain Management:    Induction: Intravenous  Airway Management Planned: Nasal Cannula  Additional Equipment:   Intra-op Plan:   Post-operative Plan:   Informed Consent: I have reviewed the patients History and Physical, chart, labs and discussed the procedure including the risks, benefits and alternatives for the proposed anesthesia with the patient or authorized representative who has indicated his/her understanding and acceptance.     Plan Discussed with:   Anesthesia Plan Comments:         Anesthesia Quick Evaluation  

## 2011-10-23 NOTE — Op Note (Signed)
NAMERAHCEL, SHUTES NO.:  [id number]  MEDICAL RECORD NO.:  [id number]  LOCATION:  [facility]                          FACILITY:  [facility]  PHYSICIAN:  Susanne Greenhouse, MD       DATE OF BIRTH:  [birth year]  DATE OF PROCEDURE:  10/23/2011 DATE OF DISCHARGE:  10/23/2011                              OPERATIVE REPORT   PREOPERATIVE DIAGNOSIS:  Nuclear cataract, right eye, diagnosis code 366.19.  POSTOPERATIVE DIAGNOSES:  Nuclear cataract, right eye, diagnosis code 366.19, intraoperative floppy iris syndrome, right eye, diagnosis code 364.81.  OPERATION PERFORMED:  Phacoemulsification with posterior chamber intraocular lens implantation, right eye.  SURGEON:  Bonne Dolores. Hazem Kenner, MD  ANESTHESIA:  Topical with MAC.Marland Kitchen  OPERATIVE SUMMARY:  In the preoperative area, dilating drops were placed into the right eye.  The patient was then brought into the operating room where he was placed under general anesthesia.  The eye was then prepped and draped.  Beginning with a 75 blade, a paracentesis port was made at the surgeon's 2 o'clock position.  The anterior chamber was then filled with a 1% nonpreserved lidocaine solution with epinephrine.  This was followed by Viscoat to deepen the chamber.  A small fornix-based peritomy was performed superiorly.  Next, a single iris hook was placed through the limbus superiorly.  A 2.4-mm keratome blade was then used to make a clear corneal incision over the iris hook.  A bent cystotome needle and Utrata forceps were used to create a continuous tear capsulotomy.  Hydrodissection was performed using balanced salt solution on a fine cannula.  The lens nucleus was then removed using phacoemulsification in a quadrant cracking technique.  The cortical material was then removed with irrigation and aspiration.  The capsular bag and anterior chamber were refilled with Provisc.  The wound was widened to approximately 3 mm and a posterior chamber intraocular  lens was placed into the capsular bag without difficulty using an [employer] lens injecting system.  A single 10-0 nylon suture was then used to close the incision as well as stromal hydration.  The Provisc was removed from the anterior chamber and capsular bag with irrigation and aspiration.  At this point, the wounds were tested for leak, which were negative.  The anterior chamber remained deep and stable.  The patient tolerated the procedure well.  There were no operative complications, and he awoke from general anesthesia without problem.  There were no surgical specimens.  Prosthetic device used was a Lenstec posterior chamber lens, model Softec HD, power of 20.75, serial number is [id number].          ______________________________ Susanne Greenhouse, MD     KEH/MEDQ  D:  10/23/2011  T:  10/23/2011  Job:  [id number]

## 2011-10-23 NOTE — Discharge Instructions (Signed)
SKYLYNNE SCHLECHTER  10/23/2011     Instructions  1. Use medications as Instructed.  Shake well before use. Wait 5 minutes between drops.  {OPHTHALMIC ANTIBIOTICS:22167} 4 times a day x 1 week.  {OPHTHALMIC ANTI-INFLAMMATORY:22168} 2 times a day x 4 weeks.  {OPHTHALMIC STEROID:22169} 4 times a day - week 1   3 times a day - Week 2, 2 times a day- Week 3, 1 time a day - Week 4.  2. Do not rub the operative eye. Do not swim underwater for 2 weeks.  3. You may remove the clear shield and resume your normal activities the day after  Surgery. Your eyes may feel more comfortable if you wear dark glasses outside.  4. Call our office at [phone] if you have sudden change in vision, extreme redness or pain. Some fluctuation in vision is normal after surgery. If you have an emergency after hours, call Dr. Alto Denver at [phone].  5. It is important that you attend all of your follow-up appointments.        Follow-up:{follow up:32580} with [name], MD.   Dr. Lahoma Crocker: [phone]  Dr. Lita Mains: [phone]  Dr. Alto Denver: [phone]   If you find that you cannot contact your physician, but feel that your signs and   Symptoms warrant a physician's attention, call the Emergency Room at   [phone].   Other{NA AND [id number]:10272}.

## 2011-10-25 ENCOUNTER — Encounter (HOSPITAL_COMMUNITY): Payer: Self-pay | Admitting: Ophthalmology

## 2011-10-30 ENCOUNTER — Telehealth: Payer: Self-pay | Admitting: Family Medicine

## 2011-10-30 MED ORDER — SOLIFENACIN SUCCINATE 5 MG PO TABS: 5.0000 mg | ORAL_TABLET | Freq: Every day | ORAL | Status: DC

## 2011-10-30 MED ORDER — MELOXICAM 15 MG PO TABS: 15.0000 mg | ORAL_TABLET | Freq: Every day | ORAL | Status: DC

## 2011-10-30 NOTE — Telephone Encounter (Signed)
Sent in

## 2011-11-27 ENCOUNTER — Ambulatory Visit (INDEPENDENT_AMBULATORY_CARE_PROVIDER_SITE_OTHER): Payer: Medicare Other | Admitting: Family Medicine

## 2011-11-27 ENCOUNTER — Encounter: Payer: Self-pay | Admitting: Family Medicine

## 2011-11-27 VITALS — BP 128/74 | HR 98 | Resp 18 | Ht 62.0 in | Wt 139.1 lb

## 2011-11-27 DIAGNOSIS — N3090 Cystitis, unspecified without hematuria: Secondary | ICD-10-CM

## 2011-11-27 DIAGNOSIS — R109 Unspecified abdominal pain: Secondary | ICD-10-CM

## 2011-11-27 DIAGNOSIS — R1032 Left lower quadrant pain: Secondary | ICD-10-CM | POA: Insufficient documentation

## 2011-11-27 LAB — POCT URINALYSIS DIPSTICK
Protein, UA: NEGATIVE
Spec Grav, UA: 1.015
Urobilinogen, UA: 0.2
pH, UA: 5.5

## 2011-11-27 MED ORDER — CEPHALEXIN 500 MG PO CAPS: 500.0000 mg | ORAL_CAPSULE | Freq: Two times a day (BID) | ORAL | Status: AC

## 2011-11-27 NOTE — Progress Notes (Signed)
  Subjective:    Patient ID: Debra Villarreal, female    DOB: [birth year], 78 y.o.   MRN: [id number]  HPI Patient presents with left-sided abdominal pain for the past week. She states it started in her suprapubic region and moved over she thought she felt a cyst there, now has soreness on left side instead of pain. She was worried about cancer coming back even though she's had a hysterectomy. She denies vaginal bleeding denies change in stool denies dysuria but does urinate often. .   Review of Systems    GEN- denies fatigue, fever, weight loss,weakness, recent illness HEENT- denies eye drainage, change in vision, nasal discharge, CVS- denies chest pain, palpitations RESP- denies SOB, cough, wheeze ABD- denies N/V, change in stools,+ abd pain GU- denies dysuria, hematuria, dribbling, incontinence MSK- +joint pain, muscle aches, injury Neuro- denies headache, dizziness, syncope, seizure activity      Objective:   Physical Exam GEN- NAD, alert and oriented x3, well appearing  HEENT- PERRL, EOMI, non injected sclera, pink conjunctiva, MMM, oropharynx clear CVS- RRR, no murmur RESP-CTAB ABD-NABS,soft, NT,ND, No CVA tenderness GU- no cervix seen,prolpased bladder, white discharge noted , normal externa genitalia, no ovarian mass felt EXT- No edema Pulses- Radial, DP- 2+        Assessment & Plan:

## 2011-11-27 NOTE — Assessment & Plan Note (Signed)
Start on Keflex near will be sent for culture

## 2011-11-27 NOTE — Patient Instructions (Addendum)
You have a urine infection take the antibiotics I will let you know if you need to get a picture done F/U 3 months

## 2011-11-27 NOTE — Assessment & Plan Note (Signed)
Benign exam. Will send for cultures for the discharge noted. She was started on antibiotics for the cystitis. If this does not improve I will send her for CT scan of abdomen and pelvis.

## 2011-11-29 LAB — GC/CHLAMYDIA PROBE AMP, GENITAL: Chlamydia, DNA Probe: NEGATIVE

## 2011-11-29 LAB — WET PREP BY MOLECULAR PROBE: Gardnerella vaginalis: POSITIVE — AB

## 2011-11-29 MED ORDER — METRONIDAZOLE 500 MG PO TABS: 500.0000 mg | ORAL_TABLET | Freq: Two times a day (BID) | ORAL | Status: AC

## 2011-11-29 NOTE — Addendum Note (Signed)
Addended by: Milinda Antis F on: 11/29/2011 09:04 AM   Modules accepted: Orders

## 2011-11-30 ENCOUNTER — Telehealth: Payer: Self-pay | Admitting: Family Medicine

## 2011-11-30 LAB — URINE CULTURE: Colony Count: NO GROWTH

## 2011-11-30 NOTE — Telephone Encounter (Signed)
Called to check on pt she is doing okay, taking both antibiotics, no pain currently

## 2011-12-07 ENCOUNTER — Telehealth: Payer: Self-pay | Admitting: Family Medicine

## 2011-12-07 DIAGNOSIS — R109 Unspecified abdominal pain: Secondary | ICD-10-CM

## 2011-12-07 NOTE — Telephone Encounter (Signed)
Patient aware.

## 2011-12-07 NOTE — Telephone Encounter (Signed)
Please let pt know CT scan of stomach will be done If she has fever, vomiting or bad pain go to the ER this weekend

## 2011-12-07 NOTE — Telephone Encounter (Signed)
Please advise 

## 2011-12-08 ENCOUNTER — Telehealth: Payer: Self-pay | Admitting: *Deleted

## 2011-12-08 ENCOUNTER — Ambulatory Visit: Payer: Medicare Other | Admitting: Family Medicine

## 2011-12-13 ENCOUNTER — Ambulatory Visit (HOSPITAL_COMMUNITY)
Admission: RE | Admit: 2011-12-13 | Discharge: 2011-12-13 | Disposition: A | Discharge status: Home / Self Care | Payer: Medicare Other | Source: Ambulatory Visit | Attending: Family Medicine | Admitting: Family Medicine

## 2011-12-13 DIAGNOSIS — R911 Solitary pulmonary nodule: Secondary | ICD-10-CM | POA: Insufficient documentation

## 2011-12-13 DIAGNOSIS — R109 Unspecified abdominal pain: Secondary | ICD-10-CM | POA: Insufficient documentation

## 2011-12-13 DIAGNOSIS — Z8542 Personal history of malignant neoplasm of other parts of uterus: Secondary | ICD-10-CM | POA: Insufficient documentation

## 2011-12-18 ENCOUNTER — Encounter: Payer: Self-pay | Admitting: Family Medicine

## 2011-12-18 ENCOUNTER — Ambulatory Visit (INDEPENDENT_AMBULATORY_CARE_PROVIDER_SITE_OTHER): Payer: Medicare Other | Admitting: Family Medicine

## 2011-12-18 VITALS — BP 112/80 | HR 83 | Resp 16 | Ht 62.0 in | Wt 142.0 lb

## 2011-12-18 DIAGNOSIS — R918 Other nonspecific abnormal finding of lung field: Secondary | ICD-10-CM

## 2011-12-18 DIAGNOSIS — R102 Pelvic and perineal pain unspecified side: Secondary | ICD-10-CM | POA: Insufficient documentation

## 2011-12-18 DIAGNOSIS — N949 Unspecified condition associated with female genital organs and menstrual cycle: Secondary | ICD-10-CM

## 2011-12-18 DIAGNOSIS — R109 Unspecified abdominal pain: Secondary | ICD-10-CM

## 2011-12-18 DIAGNOSIS — N83209 Unspecified ovarian cyst, unspecified side: Secondary | ICD-10-CM

## 2011-12-18 DIAGNOSIS — R911 Solitary pulmonary nodule: Secondary | ICD-10-CM

## 2011-12-18 NOTE — Assessment & Plan Note (Signed)
Exam today more concerning for pelvic pain  CT negative, will obtain ultrasound of pelvic to look at ovarian pathology,unable to visualize ovary on CT scan on left side

## 2011-12-18 NOTE — Assessment & Plan Note (Addendum)
Continued abdominal pain mostly in left lower quadrant,abdominal exam benign itself, She does complain of some bloating will send to GI if ultrasound negative CMET, CBC, lipase

## 2011-12-18 NOTE — Patient Instructions (Signed)
I will obtain and ultrasound of your abdomen to look at the cyst You will be referred to the gastroenterologist for the stomach pain Continue your other medications CT scan will be done to look at lung nodules  Keep previous f/u appt

## 2011-12-18 NOTE — Assessment & Plan Note (Signed)
Right base lung nodule seen, will obtain CT chest to evaluate, this was an incidental finding

## 2011-12-18 NOTE — Progress Notes (Signed)
  Subjective:    Patient ID: Debra Villarreal, female    DOB: [birth year], 78 y.o.   MRN: [id number]  HPI Pt presents to f/u recurrent abdominal pain mostly in left lower quadrants and pelvis. CT scan was unremarkable besides small right ovarian cyst, no signs of diverticulitis or colitis, s/p  antibiotics for UTI and BV, denies dysuria, vaginal discharge, change in bowels, diarrhea or constipation.    Review of Systems   GEN- denies fatigue, fever, weight loss,weakness, recent illness HEENT- denies eye drainage, change in vision, nasal discharge, CVS- denies chest pain, palpitations RESP- denies SOB, cough, wheeze ABD- denies N/V, change in stools, +abd pain GU- denies dysuria, hematuria, dribbling, incontinence MSK- denies joint pain, muscle aches, injury Neuro- denies headache, dizziness, syncope, seizure activity      Objective:   Physical Exam GEN- NAD, alert and oriented x3, well appearing  HEENT- PERRL, EOMI, non injected sclera, pink conjunctiva, MMM, oropharynx clear CVS- RRR, no murmur RESP-CTAB ABD-NABS,soft, NT,ND, No CVA tenderness GU- no cervix seen,prolpased bladder, no discharge noted , normal externa genitalia, no ovarian mass felt, TTP over left ovarian region Rectal-normal tone, no mass palpated, FOBT negative EXT- No edema Pulses- Radial, DP- 2+        Assessment & Plan:

## 2011-12-19 LAB — CBC WITH DIFFERENTIAL/PLATELET
Eosinophils Absolute: 0.1 10*3/uL (ref 0.0–0.7)
HCT: 39.2 % (ref 36.0–46.0)
Hemoglobin: 13.4 g/dL (ref 12.0–15.0)
Lymphs Abs: 2 10*3/uL (ref 0.7–4.0)
MCH: 28.5 pg (ref 26.0–34.0)
MCV: 83.2 fL (ref 78.0–100.0)
Monocytes Relative: 10 % (ref 3–12)
Neutrophils Relative %: 56 % (ref 43–77)
RBC: 4.71 MIL/uL (ref 3.87–5.11)

## 2011-12-20 ENCOUNTER — Telehealth: Payer: Self-pay | Admitting: Family Medicine

## 2011-12-20 LAB — COMPREHENSIVE METABOLIC PANEL
Albumin: 4 g/dL (ref 3.5–5.2)
Alkaline Phosphatase: 52 U/L (ref 39–117)
BUN: 15 mg/dL (ref 6–23)
Creat: 0.79 mg/dL (ref 0.50–1.10)
Glucose, Bld: 112 mg/dL — ABNORMAL HIGH (ref 70–99)
Total Bilirubin: 0.5 mg/dL (ref 0.3–1.2)

## 2011-12-20 LAB — LIPASE: Lipase: 17 U/L (ref 0–75)

## 2011-12-26 ENCOUNTER — Ambulatory Visit (HOSPITAL_COMMUNITY): Payer: Medicare Other

## 2011-12-26 ENCOUNTER — Other Ambulatory Visit: Payer: Self-pay | Admitting: Family Medicine

## 2011-12-26 ENCOUNTER — Ambulatory Visit (HOSPITAL_COMMUNITY)
Admission: RE | Admit: 2011-12-26 | Discharge: 2011-12-26 | Disposition: A | Discharge status: Home / Self Care | Payer: Medicare Other | Source: Ambulatory Visit | Attending: Family Medicine | Admitting: Family Medicine

## 2011-12-26 DIAGNOSIS — R1032 Left lower quadrant pain: Secondary | ICD-10-CM | POA: Insufficient documentation

## 2011-12-26 DIAGNOSIS — R102 Pelvic and perineal pain: Secondary | ICD-10-CM

## 2011-12-26 DIAGNOSIS — N83209 Unspecified ovarian cyst, unspecified side: Secondary | ICD-10-CM

## 2011-12-26 DIAGNOSIS — R109 Unspecified abdominal pain: Secondary | ICD-10-CM

## 2011-12-27 ENCOUNTER — Telehealth: Payer: Self-pay | Admitting: Family Medicine

## 2011-12-27 NOTE — Telephone Encounter (Signed)
She spoke with Toni Amend

## 2011-12-27 NOTE — Telephone Encounter (Signed)
She has been having pain down on her left side, the scans and ultrasound have been negative, I am sending her to the gastroenterologist. I tried to call her but no answer

## 2011-12-27 NOTE — Telephone Encounter (Signed)
Ive looked at her results and I don't know what stomach issues she is referring to. Please explain and I will call her back

## 2011-12-28 ENCOUNTER — Telehealth: Payer: Self-pay | Admitting: Family Medicine

## 2011-12-28 ENCOUNTER — Other Ambulatory Visit: Payer: Self-pay

## 2011-12-28 MED ORDER — MELOXICAM 15 MG PO TABS: 15.0000 mg | ORAL_TABLET | Freq: Every day | ORAL | Status: DC

## 2011-12-28 NOTE — Telephone Encounter (Signed)
Med refilled.

## 2012-01-09 NOTE — Telephone Encounter (Signed)
Patient is aware 

## 2012-01-11 ENCOUNTER — Telehealth: Payer: Self-pay | Admitting: Family Medicine

## 2012-01-11 NOTE — Telephone Encounter (Signed)
Please refer her to Dr. Jonette Eva instead as she request

## 2012-01-19 ENCOUNTER — Telehealth: Payer: Self-pay | Admitting: Family Medicine

## 2012-01-19 NOTE — Telephone Encounter (Signed)
Patient called back and requested Dr. Teena Dunk

## 2012-01-19 NOTE — Telephone Encounter (Signed)
PLEASE PUT THIS ORDER IN

## 2012-01-19 NOTE — Telephone Encounter (Signed)
Pt already has an order in for GI referral it says Dr. Teena Dunk ??

## 2012-01-19 NOTE — Telephone Encounter (Signed)
She has been referred to GI already - orders have been placed 2 weeks ago-

## 2012-01-22 ENCOUNTER — Telehealth: Payer: Self-pay | Admitting: Family Medicine

## 2012-01-22 ENCOUNTER — Other Ambulatory Visit: Payer: Self-pay | Admitting: Family Medicine

## 2012-01-22 DIAGNOSIS — R109 Unspecified abdominal pain: Secondary | ICD-10-CM

## 2012-01-22 NOTE — Telephone Encounter (Signed)
I have made this referral already, it seems she has called 3 different times as for a different doctor. Please verify with pt who she wants to see.

## 2012-01-24 ENCOUNTER — Encounter: Payer: Self-pay | Admitting: Gastroenterology

## 2012-01-24 ENCOUNTER — Other Ambulatory Visit: Payer: Self-pay | Admitting: Internal Medicine

## 2012-01-24 ENCOUNTER — Ambulatory Visit (INDEPENDENT_AMBULATORY_CARE_PROVIDER_SITE_OTHER): Payer: Medicare Other | Admitting: Gastroenterology

## 2012-01-24 VITALS — BP 139/85 | HR 84 | Temp 98.2°F | Ht 64.0 in | Wt 140.6 lb

## 2012-01-24 DIAGNOSIS — Z139 Encounter for screening, unspecified: Secondary | ICD-10-CM | POA: Insufficient documentation

## 2012-01-24 DIAGNOSIS — K5900 Constipation, unspecified: Secondary | ICD-10-CM | POA: Insufficient documentation

## 2012-01-24 DIAGNOSIS — Z1211 Encounter for screening for malignant neoplasm of colon: Secondary | ICD-10-CM | POA: Insufficient documentation

## 2012-01-24 MED ORDER — PEG 3350-KCL-NA BICARB-NACL 420 G PO SOLR: 4000.0000 L | ORAL | Status: AC

## 2012-01-24 NOTE — Progress Notes (Signed)
Faxed to PCP

## 2012-01-24 NOTE — Assessment & Plan Note (Signed)
78 year old female with diffuse lower abdominal pain and TTP LLQ, CT without acute findings, CBC normal. No rectal bleeding. Notes new onset constipation. Prior to pain onset, noted a sensation of "pulling apart" of lower midline abdomen, at scar. Stated "ball" moved to LLQ. No significant findings on physical exam. Question adhesive disease. Unclear etiology of the interesting symptoms she has described. Constipation to be addressed and first ever screening colonoscopy to be performed.   Proceed with TCS with Dr. Jena Gauss in near future: the risks, benefits, and alternatives have been discussed with the patient in detail. The patient states understanding and desires to proceed.

## 2012-01-24 NOTE — Assessment & Plan Note (Signed)
Miralax prn. Colonoscopy in near future, none has been performed prior.

## 2012-01-24 NOTE — Progress Notes (Signed)
Primary Care Physician:  Climax, KAWANTA, MD Primary Gastroenterologist:  Dr. Rourk  Chief Complaint  Patient presents with  . Abdominal Pain  . Constipation    HPI:   Debra Villarreal is a pleasant 78-year-old female who presents today at the request of Dr. Oakwood secondary to abdominal pain. Notes recent sensation of lower midline abdominal "coming loose", and a ball moving to left side. Now notes diffuse lower abdominal dull aching, constant, worse with bending. Notes constipation this week, taking OTC agent but unsure of name. Prior to this, BM every other day. Feels less pressure after BM. Notes bloating. Stool small balls at times. No rectal bleeding, N/V, wt loss. Good appetite.   No prior colonoscopy.   Lab Results  Component Value Date   WBC 6.1 12/18/2011   HGB 13.4 12/18/2011   HCT 39.2 12/18/2011   MCV 83.2 12/18/2011   PLT 233 12/18/2011     Chemistry      Component Value Date/Time   NA 138 12/18/2011 0907   K 4.2 12/18/2011 0907   CL 101 12/18/2011 0907   CO2 28 12/18/2011 0907   BUN 15 12/18/2011 0907   CREATININE 0.79 12/18/2011 0907   CREATININE 0.83 10/05/2011 1435      Component Value Date/Time   CALCIUM 9.4 12/18/2011 0907   ALKPHOS 52 12/18/2011 0907   AST 23 12/18/2011 0907   ALT 14 12/18/2011 0907   BILITOT 0.5 12/18/2011 0907     Lab Results  Component Value Date   LIPASE 17 12/18/2011    CT July 2013 IMPRESSION:  1. No explanation for the patient's pain is seen.  2. There is a cystic structure in the right adnexa most consistent  with 3 cm right ovarian cyst. Correlate clinically.  3. 3 mm noncalcified nodule subpleural in the right lower lobe of  questionable significance.        Past Medical History  Diagnosis Date  . Takotsubo cardiomyopathy 2007    Resolved  . S/P cardiac catheterization 2007    normal coronaries  . Cancer     ?uterine cancer  . Anxiety   . Cataract   . Arthritis   . Gout     Past Surgical History  Procedure Date  . Abdominal  hysterectomy     1966  . Appendectomy   . Cataract extraction w/phaco 10/12/2011    Procedure: CATARACT EXTRACTION PHACO AND INTRAOCULAR LENS PLACEMENT (IOC);  Surgeon: Kerry Hunt, MD;  Location: [facility];  Service: Ophthalmology;  Laterality: Left;  CDE:18.57  . Cataract extraction w/phaco 10/23/2011    Procedure: CATARACT EXTRACTION PHACO AND INTRAOCULAR LENS PLACEMENT (IOC);  Surgeon: Kerry Hunt, MD;  Location: [facility];  Service: Ophthalmology;  Laterality: Right;  CDE 18.09    Current Outpatient Prescriptions  Medication Sig Dispense Refill  . LORazepam (ATIVAN) 1 MG tablet Take 1 mg by mouth daily.      . meloxicam (MOBIC) 15 MG tablet Take 1 tablet (15 mg total) by mouth daily.  30 tablet  4  . solifenacin (VESICARE) 5 MG tablet Take 1 tablet (5 mg total) by mouth daily.  90 tablet  1    Allergies as of 01/24/2012  . (No Known Allergies)    Family History  Problem Relation Age of Onset  . Cancer Mother     Ovarian  . Colon cancer Neg Hx   . Cancer Sister     Breast  . Anesthesia problems Neg Hx   . Hypotension Neg Hx   .   Malignant hyperthermia Neg Hx   . Pseudochol deficiency Neg Hx     History   Social History  . Marital Status: Married    Spouse Name: N/A    Number of Children: N/A  . Years of Education: N/A   Occupational History  . Not on file.   Social History Main Topics  . Smoking status: Never Smoker   . Smokeless tobacco: Not on file  . Alcohol Use: No  . Drug Use: No  . Sexually Active: No   Other Topics Concern  . Not on file   Social History Narrative  . No narrative on file    Review of Systems: Gen: Denies any fever, chills, fatigue, weight loss, lack of appetite.  CV: Denies chest pain, heart palpitations, peripheral edema, syncope.  Resp: Denies shortness of breath at rest or with exertion. Denies wheezing or cough.  GI: Denies dysphagia or odynophagia. Denies jaundice, hematemesis, fecal incontinence. GU : Denies urinary burning, urinary  frequency, urinary hesitancy MS: Denies joint pain, muscle weakness, cramps, or limitation of movement.  Derm: Denies rash, itching, dry skin Psych: Denies depression, anxiety, memory loss, and confusion Heme: Denies bruising, bleeding, and enlarged lymph nodes.  Physical Exam: BP 139/85  Pulse 84  Temp 98.2 F (36.8 C) (Temporal)  Ht 5' 4" (1.626 m)  Wt 140 lb 9.6 oz (63.776 kg)  BMI 24.13 kg/m2 General:   Alert and oriented. Pleasant and cooperative. Well-nourished and well-developed.  Head:  Normocephalic and atraumatic. Eyes:  Without icterus, sclera clear and conjunctiva pink.  Ears:  Normal auditory acuity. Nose:  No deformity, discharge,  or lesions. Mouth:  No deformity or lesions, oral mucosa pink.  Neck:  Supple, without mass or thyromegaly. Lungs:  Clear to auscultation bilaterally. No wheezes, rales, or rhonchi. No distress.  Heart:  S1, S2 present without murmurs appreciated.  Abdomen:  +BS, soft, mild TTP LLQ and non-distended. No HSM noted. No guarding or rebound. No masses appreciated.  Rectal:  Deferred  Msk:  Symmetrical without gross deformities. Normal posture. Pulses:  Normal pulses noted. Extremities:  Without clubbing or edema. Neurologic:  Alert and  oriented x4;  grossly normal neurologically. Skin:  Intact without significant lesions or rashes. Cervical Nodes:  No significant cervical adenopathy. Psych:  Alert and cooperative. Normal mood and affect.    

## 2012-01-24 NOTE — Patient Instructions (Addendum)
We have set you up for a colonoscopy with Dr. Jena Gauss in the near future.  You may take Miralax daily as needed for constipation. This is over-the-counter.

## 2012-01-26 ENCOUNTER — Encounter (HOSPITAL_COMMUNITY): Payer: Self-pay | Admitting: Pharmacy Technician

## 2012-01-31 ENCOUNTER — Encounter (HOSPITAL_COMMUNITY): Payer: Self-pay | Admitting: *Deleted

## 2012-01-31 ENCOUNTER — Ambulatory Visit (HOSPITAL_COMMUNITY)
Admission: RE | Admit: 2012-01-31 | Discharge: 2012-01-31 | Disposition: A | Discharge status: Home / Self Care | Payer: Medicare Other | Source: Ambulatory Visit | Attending: Internal Medicine | Admitting: Internal Medicine

## 2012-01-31 ENCOUNTER — Encounter (HOSPITAL_COMMUNITY)
Admission: RE | Disposition: A | Discharge status: Home / Self Care | Payer: Self-pay | Source: Ambulatory Visit | Attending: Internal Medicine

## 2012-01-31 DIAGNOSIS — Z1211 Encounter for screening for malignant neoplasm of colon: Secondary | ICD-10-CM

## 2012-01-31 DIAGNOSIS — D126 Benign neoplasm of colon, unspecified: Secondary | ICD-10-CM | POA: Insufficient documentation

## 2012-01-31 DIAGNOSIS — Z139 Encounter for screening, unspecified: Secondary | ICD-10-CM

## 2012-01-31 DIAGNOSIS — D129 Benign neoplasm of anus and anal canal: Secondary | ICD-10-CM | POA: Insufficient documentation

## 2012-01-31 DIAGNOSIS — D128 Benign neoplasm of rectum: Secondary | ICD-10-CM | POA: Insufficient documentation

## 2012-01-31 DIAGNOSIS — K648 Other hemorrhoids: Secondary | ICD-10-CM

## 2012-01-31 DIAGNOSIS — K5730 Diverticulosis of large intestine without perforation or abscess without bleeding: Secondary | ICD-10-CM

## 2012-01-31 HISTORY — PX: COLONOSCOPY: SHX5424

## 2012-01-31 SURGERY — COLONOSCOPY
Anesthesia: Moderate Sedation

## 2012-01-31 MED ORDER — MIDAZOLAM HCL 5 MG/5ML IJ SOLN
INTRAMUSCULAR | Status: DC | PRN
  Administered 2012-01-31: 2 mg via INTRAVENOUS
  Administered 2012-01-31: 1 mg via INTRAVENOUS

## 2012-01-31 MED ORDER — SODIUM CHLORIDE 0.45 % IV SOLN
INTRAVENOUS | Status: DC
  Administered 2012-01-31: 1000 mL via INTRAVENOUS

## 2012-01-31 MED ORDER — STERILE WATER FOR IRRIGATION IR SOLN
Status: DC | PRN
  Administered 2012-01-31: 14:00:00

## 2012-01-31 MED ORDER — MIDAZOLAM HCL 5 MG/5ML IJ SOLN
INTRAMUSCULAR | Status: AC
  Filled 2012-01-31: qty 10

## 2012-01-31 MED ORDER — MEPERIDINE HCL 100 MG/ML IJ SOLN
INTRAMUSCULAR | Status: DC | PRN
Start: 2012-01-31 — End: 2012-01-31
  Administered 2012-01-31: 50 mg via INTRAVENOUS

## 2012-01-31 MED ORDER — MEPERIDINE HCL 100 MG/ML IJ SOLN
INTRAMUSCULAR | Status: AC
  Filled 2012-01-31: qty 1

## 2012-01-31 NOTE — Interval H&P Note (Signed)
History and Physical Interval Note:  01/31/2012 1:52 PM  Debra Villarreal  has presented today for surgery, with the diagnosis of SCREENING COLONOSCOPY  The various methods of treatment have been discussed with the patient and family. After consideration of risks, benefits and other options for treatment, the patient has consented to  Procedure(s) (LRB): COLONOSCOPY (N/A) as a surgical intervention .  The patient's history has been reviewed, patient examined, no change in status, stable for surgery.  I have reviewed the patient's chart and labs.  Questions were answered to the patient's satisfaction.     [name]

## 2012-01-31 NOTE — H&P (View-Only) (Signed)
Primary Care Physician:  Milinda Antis, MD Primary Gastroenterologist:  Dr. Jena Gauss  Chief Complaint  Patient presents with  . Abdominal Pain  . Constipation    HPI:   Debra Villarreal is a pleasant 78 year old female who presents today at the request of Dr. Jeanice Lim secondary to abdominal pain. Notes recent sensation of lower midline abdominal "coming loose", and a ball moving to left side. Now notes diffuse lower abdominal dull aching, constant, worse with bending. Notes constipation this week, taking OTC agent but unsure of name. Prior to this, BM every other day. Feels less pressure after BM. Notes bloating. Stool small balls at times. No rectal bleeding, N/V, wt loss. Good appetite.   No prior colonoscopy.   Lab Results  Component Value Date   WBC 6.1 12/18/2011   HGB 13.4 12/18/2011   HCT 39.2 12/18/2011   MCV 83.2 12/18/2011   PLT 233 12/18/2011     Chemistry      Component Value Date/Time   NA 138 12/18/2011 0907   K 4.2 12/18/2011 0907   CL 101 12/18/2011 0907   CO2 28 12/18/2011 0907   BUN 15 12/18/2011 0907   CREATININE 0.79 12/18/2011 0907   CREATININE 0.83 10/05/2011 1435      Component Value Date/Time   CALCIUM 9.4 12/18/2011 0907   ALKPHOS 52 12/18/2011 0907   AST 23 12/18/2011 0907   ALT 14 12/18/2011 0907   BILITOT 0.5 12/18/2011 0907     Lab Results  Component Value Date   LIPASE 17 12/18/2011    CT July 2013 IMPRESSION:  1. No explanation for the patient's pain is seen.  2. There is a cystic structure in the right adnexa most consistent  with 3 cm right ovarian cyst. Correlate clinically.  3. 3 mm noncalcified nodule subpleural in the right lower lobe of  questionable significance.        Past Medical History  Diagnosis Date  . Takotsubo cardiomyopathy 2007    Resolved  . S/P cardiac catheterization 2007    normal coronaries  . Cancer     ?uterine cancer  . Anxiety   . Cataract   . Arthritis   . Gout     Past Surgical History  Procedure Date  . Abdominal  hysterectomy     1966  . Appendectomy   . Cataract extraction w/phaco 10/12/2011    Procedure: CATARACT EXTRACTION PHACO AND INTRAOCULAR LENS PLACEMENT (IOC);  Surgeon: [name], MD;  Location: [facility];  Service: Ophthalmology;  Laterality: Left;  CDE:18.57  . Cataract extraction w/phaco 10/23/2011    Procedure: CATARACT EXTRACTION PHACO AND INTRAOCULAR LENS PLACEMENT (IOC);  Surgeon: [name], MD;  Location: [facility];  Service: Ophthalmology;  Laterality: Right;  CDE 18.09    Current Outpatient Prescriptions  Medication Sig Dispense Refill  . LORazepam (ATIVAN) 1 MG tablet Take 1 mg by mouth daily.      . meloxicam (MOBIC) 15 MG tablet Take 1 tablet (15 mg total) by mouth daily.  30 tablet  4  . solifenacin (VESICARE) 5 MG tablet Take 1 tablet (5 mg total) by mouth daily.  90 tablet  1    Allergies as of 01/24/2012  . (No Known Allergies)    Family History  Problem Relation Age of Onset  . Cancer Mother     Ovarian  . Colon cancer Neg Hx   . Cancer Sister     Breast  . Anesthesia problems Neg Hx   . Hypotension Neg Hx   .  Malignant hyperthermia Neg Hx   . Pseudochol deficiency Neg Hx     History   Social History  . Marital Status: Married    Spouse Name: N/A    Number of Children: N/A  . Years of Education: N/A   Occupational History  . Not on file.   Social History Main Topics  . Smoking status: Never Smoker   . Smokeless tobacco: Not on file  . Alcohol Use: No  . Drug Use: No  . Sexually Active: No   Other Topics Concern  . Not on file   Social History Narrative  . No narrative on file    Review of Systems: Gen: Denies any fever, chills, fatigue, weight loss, lack of appetite.  CV: Denies chest pain, heart palpitations, peripheral edema, syncope.  Resp: Denies shortness of breath at rest or with exertion. Denies wheezing or cough.  GI: Denies dysphagia or odynophagia. Denies jaundice, hematemesis, fecal incontinence. GU : Denies urinary burning, urinary  frequency, urinary hesitancy MS: Denies joint pain, muscle weakness, cramps, or limitation of movement.  Derm: Denies rash, itching, dry skin Psych: Denies depression, anxiety, memory loss, and confusion Heme: Denies bruising, bleeding, and enlarged lymph nodes.  Physical Exam: BP 139/85  Pulse 84  Temp 98.2 F (36.8 C) (Temporal)  Ht 5\' 4"  (1.626 m)  Wt 140 lb 9.6 oz (63.776 kg)  BMI 24.13 kg/m2 General:   Alert and oriented. Pleasant and cooperative. Well-nourished and well-developed.  Head:  Normocephalic and atraumatic. Eyes:  Without icterus, sclera clear and conjunctiva pink.  Ears:  Normal auditory acuity. Nose:  No deformity, discharge,  or lesions. Mouth:  No deformity or lesions, oral mucosa pink.  Neck:  Supple, without mass or thyromegaly. Lungs:  Clear to auscultation bilaterally. No wheezes, rales, or rhonchi. No distress.  Heart:  S1, S2 present without murmurs appreciated.  Abdomen:  +BS, soft, mild TTP LLQ and non-distended. No HSM noted. No guarding or rebound. No masses appreciated.  Rectal:  Deferred  Msk:  Symmetrical without gross deformities. Normal posture. Pulses:  Normal pulses noted. Extremities:  Without clubbing or edema. Neurologic:  Alert and  oriented x4;  grossly normal neurologically. Skin:  Intact without significant lesions or rashes. Cervical Nodes:  No significant cervical adenopathy. Psych:  Alert and cooperative. Normal mood and affect.

## 2012-01-31 NOTE — Op Note (Signed)
[facility] [address] [place], [address]   COLONOSCOPY PROCEDURE REPORT  PATIENT: Debra Villarreal, Debra Villarreal  MR#:         [id number] BIRTHDATE: [birth year] , 79  yrs. old GENDER: Female ENDOSCOPIST: R.  Roetta Sessions, MD FACP FACG REFERRED BY:  Milinda Antis, M.D. PROCEDURE DATE:  01/31/2012 PROCEDURE:     Colonoscopy with biopsy and snare polypectomy  INDICATIONS: first ever average risk screening colonoscopy.  INFORMED CONSENT:  The risks, benefits, alternatives and imponderables including but not limited to bleeding, perforation as well as the possibility of a missed lesion have been reviewed.  The potential for biopsy, lesion removal, etc. have also been discussed.  Questions have been answered.  All parties agreeable. Please see the history and physical in the medical record for more information.  MEDICATIONS: Versed 3 mg IV and Demerol 50 mg IV in divided doses  DESCRIPTION OF PROCEDURE:  After a digital rectal exam was performed, the Pentax Colonoscope [id number]  colonoscope was advanced from the anus through the rectum and colon to the area of the cecum, ileocecal valve and appendiceal orifice.  The cecum was deeply intubated.  These structures were well-seen and photographed for the record.  From the level of the cecum and ileocecal valve, the scope was slowly and cautiously withdrawn.  The mucosal surfaces were carefully surveyed utilizing scope tip deflection to facilitate fold flattening as needed.  The scope was pulled down into the rectum where a thorough examination including retroflexion was performed.    FINDINGS:  suboptimal to poor preparation compromised examination. Internal hemorrhoids and anal papilla present. Single diminutive polyp at 10 cm from the anal verge. Extensive left-sided diverticula;  the patient had a 5 mm polyp in the ascending segment and a single diminutive polyp in the base of the cecum. The remainder of the colonic  mucosa appeared normal.  THERAPEUTIC / DIAGNOSTIC MANEUVERS PERFORMED:  Rectal polyp was cold biopsied/removed. The cecal polyp was cold biopsied/removed. The ascending  polyp was cold snare removed  COMPLICATIONS: none  CECAL WITHDRAWAL TIME:  15 minutes  IMPRESSION:  Colonic polyps-removed as described above. Diverticulosis. Internal hemorrhoids.  Nonspecific abdominal pain - did not improve with a course of MiraLax. No finding in her colon to explain symptoms. CT findings as chronicled elsewhere. History of right adnexal cystic lesion. Patient does not recall having a gynecological evaluation.  RECOMMENDATIONS: Continue MiraLax as needed for constipation. Followup on pathology. Follow with  Dr. Jeanice Lim in reference to any gynecological evaluation she would deem appropriate.   _______________________________ eSigned:  R. Roetta Sessions, MD FACP [facility] 01/31/2012 2:43 PM   CC:    PATIENT NAME:  Debra Villarreal, Debra Villarreal MR#: [id number]

## 2012-02-04 ENCOUNTER — Encounter: Payer: Self-pay | Admitting: Internal Medicine

## 2012-02-05 ENCOUNTER — Encounter: Payer: Self-pay | Admitting: *Deleted

## 2012-02-06 ENCOUNTER — Encounter (HOSPITAL_COMMUNITY): Payer: Self-pay | Admitting: Internal Medicine

## 2012-02-08 ENCOUNTER — Other Ambulatory Visit: Payer: Self-pay

## 2012-02-08 ENCOUNTER — Other Ambulatory Visit: Payer: Self-pay | Admitting: Adult Health

## 2012-02-08 ENCOUNTER — Telehealth: Payer: Self-pay | Admitting: Family Medicine

## 2012-02-08 ENCOUNTER — Encounter: Payer: Self-pay | Admitting: Adult Health

## 2012-02-08 ENCOUNTER — Encounter (HOSPITAL_COMMUNITY): Payer: Self-pay | Admitting: Emergency Medicine

## 2012-02-08 ENCOUNTER — Inpatient Hospital Stay (HOSPITAL_COMMUNITY)
Admission: EM | Admit: 2012-02-08 | Discharge: 2012-02-10 | DRG: 287 | Disposition: A | Discharge status: Home / Self Care | Payer: Medicare Other | Attending: Internal Medicine | Admitting: Internal Medicine

## 2012-02-08 ENCOUNTER — Emergency Department (HOSPITAL_COMMUNITY): Payer: Medicare Other

## 2012-02-08 DIAGNOSIS — Z8249 Family history of ischemic heart disease and other diseases of the circulatory system: Secondary | ICD-10-CM

## 2012-02-08 DIAGNOSIS — H269 Unspecified cataract: Secondary | ICD-10-CM | POA: Diagnosis present

## 2012-02-08 DIAGNOSIS — I214 Non-ST elevation (NSTEMI) myocardial infarction: Secondary | ICD-10-CM

## 2012-02-08 DIAGNOSIS — E781 Pure hyperglyceridemia: Secondary | ICD-10-CM | POA: Diagnosis present

## 2012-02-08 DIAGNOSIS — I5181 Takotsubo syndrome: Principal | ICD-10-CM | POA: Diagnosis present

## 2012-02-08 DIAGNOSIS — Z8542 Personal history of malignant neoplasm of other parts of uterus: Secondary | ICD-10-CM

## 2012-02-08 DIAGNOSIS — R1032 Left lower quadrant pain: Secondary | ICD-10-CM | POA: Diagnosis present

## 2012-02-08 DIAGNOSIS — Z79899 Other long term (current) drug therapy: Secondary | ICD-10-CM

## 2012-02-08 DIAGNOSIS — F411 Generalized anxiety disorder: Secondary | ICD-10-CM | POA: Diagnosis present

## 2012-02-08 DIAGNOSIS — K5900 Constipation, unspecified: Secondary | ICD-10-CM | POA: Diagnosis present

## 2012-02-08 HISTORY — DX: Malignant neoplasm of uterus, part unspecified: C55

## 2012-02-08 HISTORY — DX: Unspecified cataract: H26.9

## 2012-02-08 LAB — CBC WITH DIFFERENTIAL/PLATELET
Basophils Relative: 0 % (ref 0–1)
Eosinophils Absolute: 0 10*3/uL (ref 0.0–0.7)
Eosinophils Relative: 0 % (ref 0–5)
HCT: 38.3 % (ref 36.0–46.0)
Hemoglobin: 13 g/dL (ref 12.0–15.0)
MCH: 28.8 pg (ref 26.0–34.0)
MCHC: 33.9 g/dL (ref 30.0–36.0)
MCV: 84.9 fL (ref 78.0–100.0)
Monocytes Absolute: 0.7 10*3/uL (ref 0.1–1.0)
Monocytes Relative: 8 % (ref 3–12)
Neutrophils Relative %: 73 % (ref 43–77)

## 2012-02-08 LAB — LACTIC ACID, PLASMA: Lactic Acid, Venous: 1.2 mmol/L (ref 0.5–2.2)

## 2012-02-08 LAB — CK TOTAL AND CKMB (NOT AT ARMC)
CK, MB: 6.1 ng/mL (ref 0.3–4.0)
CK, MB: 10.4 ng/mL (ref 0.3–4.0)
Relative Index: 9.4 — ABNORMAL HIGH (ref 0.0–2.5)

## 2012-02-08 LAB — URINALYSIS, ROUTINE W REFLEX MICROSCOPIC
Hgb urine dipstick: NEGATIVE
Ketones, ur: NEGATIVE mg/dL
Protein, ur: NEGATIVE mg/dL
Urobilinogen, UA: 0.2 mg/dL (ref 0.0–1.0)

## 2012-02-08 LAB — COMPREHENSIVE METABOLIC PANEL
Albumin: 3.6 g/dL (ref 3.5–5.2)
BUN: 22 mg/dL (ref 6–23)
Calcium: 9.8 mg/dL (ref 8.4–10.5)
Creatinine, Ser: 0.86 mg/dL (ref 0.50–1.10)
GFR calc Af Amer: 74 mL/min — ABNORMAL LOW (ref >90)
Glucose, Bld: 132 mg/dL — ABNORMAL HIGH (ref 70–99)
Total Protein: 7.3 g/dL (ref 6.0–8.3)

## 2012-02-08 LAB — URINE MICROSCOPIC-ADD ON

## 2012-02-08 LAB — TROPONIN I
Troponin I: 1.1 ng/mL (ref <0.30)
Troponin I: 1.56 ng/mL

## 2012-02-08 LAB — LIPASE, BLOOD: Lipase: 41 U/L (ref 11–59)

## 2012-02-08 LAB — OCCULT BLOOD, POC DEVICE: Fecal Occult Bld: NEGATIVE

## 2012-02-08 LAB — MRSA PCR SCREENING: MRSA by PCR: NEGATIVE

## 2012-02-08 MED ORDER — ONDANSETRON HCL 4 MG/2ML IJ SOLN
4.0000 mg | Freq: Once | INTRAMUSCULAR | Status: AC
  Administered 2012-02-08: 4 mg via INTRAVENOUS
  Filled 2012-02-08: qty 2

## 2012-02-08 MED ORDER — IOHEXOL 300 MG/ML  SOLN
100.0000 mL | Freq: Once | INTRAMUSCULAR | Status: AC | PRN
  Administered 2012-02-08: 100 mL via INTRAVENOUS

## 2012-02-08 MED ORDER — METOPROLOL TARTRATE 12.5 MG HALF TABLET: 12.5000 mg | ORAL_TABLET | Freq: Two times a day (BID) | ORAL | Status: DC

## 2012-02-08 MED ORDER — DARIFENACIN HYDROBROMIDE ER 7.5 MG PO TB24
7.5000 mg | ORAL_TABLET | Freq: Every day | ORAL | Status: DC
  Administered 2012-02-08 – 2012-02-10 (×3): 7.5 mg via ORAL
  Filled 2012-02-08 (×3): qty 1

## 2012-02-08 MED ORDER — ACETAMINOPHEN 325 MG PO TABS: 650.0000 mg | ORAL_TABLET | ORAL | Status: DC | PRN

## 2012-02-08 MED ORDER — ASPIRIN EC 81 MG PO TBEC: 81.0000 mg | DELAYED_RELEASE_TABLET | Freq: Every day | ORAL | Status: DC

## 2012-02-08 MED ORDER — HEPARIN BOLUS VIA INFUSION
4000.0000 [IU] | Freq: Once | INTRAVENOUS | Status: AC
  Administered 2012-02-08: 4000 [IU] via INTRAVENOUS

## 2012-02-08 MED ORDER — NITROGLYCERIN 0.4 MG SL SUBL: 0.4000 mg | SUBLINGUAL_TABLET | SUBLINGUAL | Status: DC | PRN

## 2012-02-08 MED ORDER — SODIUM CHLORIDE 0.9 % IV SOLN: Freq: Once | INTRAVENOUS | Status: DC

## 2012-02-08 MED ORDER — ENOXAPARIN SODIUM 150 MG/ML ~~LOC~~ SOLN: 40.0000 mg | SUBCUTANEOUS | Status: DC

## 2012-02-08 MED ORDER — HEPARIN (PORCINE) IN NACL 100-0.45 UNIT/ML-% IJ SOLN
16.0000 [IU]/kg/h | INTRAMUSCULAR | Status: DC
  Administered 2012-02-08: 16 [IU]/kg/h via INTRAVENOUS
  Filled 2012-02-08 (×2): qty 250

## 2012-02-08 MED ORDER — ACETAMINOPHEN 325 MG PO TABS: 650.0000 mg | ORAL_TABLET | Freq: Four times a day (QID) | ORAL | Status: DC | PRN

## 2012-02-08 MED ORDER — ONDANSETRON HCL 4 MG PO TABS: 4.0000 mg | ORAL_TABLET | Freq: Four times a day (QID) | ORAL | Status: DC | PRN

## 2012-02-08 MED ORDER — ACETAMINOPHEN 650 MG RE SUPP: 650.0000 mg | Freq: Four times a day (QID) | RECTAL | Status: DC | PRN

## 2012-02-08 MED ORDER — ASPIRIN EC 81 MG PO TBEC
81.0000 mg | DELAYED_RELEASE_TABLET | Freq: Every day | ORAL | Status: DC
  Administered 2012-02-09 – 2012-02-10 (×2): 81 mg via ORAL
  Filled 2012-02-08 (×2): qty 1

## 2012-02-08 MED ORDER — SODIUM CHLORIDE 0.9 % IV SOLN
INTRAVENOUS | Status: DC
  Administered 2012-02-08: 1000 mL via INTRAVENOUS
  Administered 2012-02-09: 06:00:00 via INTRAVENOUS

## 2012-02-08 MED ORDER — ONDANSETRON HCL 4 MG/2ML IJ SOLN: 4.0000 mg | Freq: Four times a day (QID) | INTRAMUSCULAR | Status: DC | PRN

## 2012-02-08 MED ORDER — SODIUM CHLORIDE 0.9 % IJ SOLN
3.0000 mL | Freq: Two times a day (BID) | INTRAMUSCULAR | Status: DC
  Administered 2012-02-08 – 2012-02-09 (×3): 3 mL via INTRAVENOUS

## 2012-02-08 MED ORDER — METOPROLOL TARTRATE 12.5 MG HALF TABLET
12.5000 mg | ORAL_TABLET | Freq: Two times a day (BID) | ORAL | Status: DC
  Administered 2012-02-09: 12.5 mg via ORAL
  Filled 2012-02-08 (×5): qty 1

## 2012-02-08 MED ORDER — ASPIRIN 81 MG PO CHEW
324.0000 mg | CHEWABLE_TABLET | Freq: Once | ORAL | Status: AC
  Administered 2012-02-08: 324 mg via ORAL
  Filled 2012-02-08: qty 4

## 2012-02-08 MED ORDER — SODIUM CHLORIDE 0.9 % IV BOLUS (SEPSIS)
1000.0000 mL | Freq: Once | INTRAVENOUS | Status: AC
  Administered 2012-02-08: 1000 mL via INTRAVENOUS

## 2012-02-08 MED ORDER — SODIUM CHLORIDE 0.9 % IV SOLN: 4.0000 mg | Freq: Four times a day (QID) | INTRAVENOUS | Status: DC | PRN

## 2012-02-08 MED ORDER — ONDANSETRON HCL 4 MG/2ML IJ SOLN
4.0000 mg | Freq: Three times a day (TID) | INTRAMUSCULAR | Status: AC | PRN
Start: 2012-02-08 — End: 2012-02-09

## 2012-02-08 MED ORDER — ASPIRIN EC 325 MG PO TBEC
325.0000 mg | DELAYED_RELEASE_TABLET | Freq: Every day | ORAL | Status: DC
  Filled 2012-02-08: qty 1

## 2012-02-08 MED ORDER — SENNA 8.6 MG PO TABS
1.0000 | ORAL_TABLET | Freq: Two times a day (BID) | ORAL | Status: DC
  Administered 2012-02-08 – 2012-02-09 (×2): 8.6 mg via ORAL
  Filled 2012-02-08 (×5): qty 1

## 2012-02-08 NOTE — ED Notes (Signed)
Pt c/o n/v that started this am with dizziness. Pt states she has not had a regular bm since her colonoscopy 2 weeks ago.

## 2012-02-08 NOTE — ED Notes (Signed)
Family in room and aware of transfer. Pt resting in bed with nad. Denies any pain

## 2012-02-08 NOTE — ED Notes (Signed)
CRITICAL VALUE ALERT  Critical value received:  Troponin  Date of notification:  02/08/12  Time of notification:  1132  Critical value read back:yes  Nurse who received alert:  Lake Bells, RN  MD notified (1st page):  Dr Manus Gunning at [phone]  Verbal order for repeat EKG obtained.

## 2012-02-08 NOTE — Progress Notes (Signed)
CRITICAL VALUE ALERT  Critical value received:  CKMB 7.4; Trop 2.03; MB 10.4  Date of notification: 02/08/2012   Time of notification:  1900  Critical value read back:yes  Nurse who received alert: Gerald Dexter, RN  MD notified (1st page):  Theodore Demark, RN  Time of first page:  1900  MD notified (2nd page):  Time of second page:  Responding MD:  Theodore Demark, RN  Time MD responded: [phone]

## 2012-02-08 NOTE — Consult Note (Signed)
ANTICOAGULATION CONSULT NOTE - Initial Consult  Pharmacy Consult for IV Heparin Indication: chest pain/ACS/ STEMI  No Known Allergies  Patient Measurements: Height: 5\' 4"  (162.6 cm) Weight: 140 lb (63.504 kg) IBW/kg (Calculated) : 54.7   Vital Signs: Temp: 98.7 F (37.1 C) (08/29 1304) Temp src: Oral (08/29 1304) BP: 99/66 mmHg (08/29 1516) Pulse Rate: 89  (08/29 1516)  Labs:  Basename 02/08/12 1208 02/08/12 1047 02/08/12 0815  HGB -- -- 13.0  HCT -- -- 38.3  PLT -- -- 175  APTT -- -- --  LABPROT -- -- --  INR -- -- --  HEPARINUNFRC -- -- --  CREATININE -- -- 0.86  CKTOTAL 80 -- --  CKMB 6.1* -- --  TROPONINI 1.56* 1.10* <0.30   Estimated Creatinine Clearance: 47.3 ml/min (by C-G formula based on Cr of 0.86).  Medical History: Past Medical History  Diagnosis Date  . Takotsubo cardiomyopathy 2007    Normary coronaries 2007, initial EF 18% improved to 60%   . Uterine cancer   . Anxiety   . Bilateral cataracts   . Arthritis    Medications:  Infusions:    . heparin     Assessment: 78yo female presented to ED with ACS/STEMI.  Baseline labs pending (predict normal).  Goal of Therapy:  Heparin level 0.3-0.7 units/ml Monitor platelets by anticoagulation protocol: Yes   Plan:  Start heparin infusion at 1000 units/hr, 4000 unit bolus already given in ED per MD Check heparin level in 6 hrs then daily CBC per protocol  Valrie Hart A 02/08/2012,3:37 PM

## 2012-02-08 NOTE — ED Notes (Signed)
NT Debra Villarreal informed me of sitting pt to floor. Pt denies any pain. Pt states she stood up and felt dizzy and needed to sit. Dr. Manus Gunning aware

## 2012-02-08 NOTE — ED Provider Notes (Signed)
History   This chart was scribed for Debra Octave, MD by Charolett Bumpers . The patient was seen in room APA08/APA08. Patient's care was started at 0741.    CSN: [id number]  Arrival date & time 02/08/12  0730   First MD Initiated Contact with Patient 02/08/12 [phone]      Chief Complaint  Patient presents with  . Nausea  . Emesis  . Dizziness  . Constipation    (Consider location/radiation/quality/duration/timing/severity/associated sxs/prior treatment) HPI RAEANNE [place]arreal is a 78 y.o. female who presents to the Emergency Department complaining of constant, moderate constipation with an onset of a week ago after getting a colonoscopy by Dr. Jena Gauss. Pt reports associated n/v,dizziness, light-headedness, chest pain with vomiting, and a headache with sitting up that started this morning. Pt reports 2 episodes of vomiting this morning. Pt also reports leg/feet numbness upon waking. Pt denies any spinning sensation, abdominal pain, blood in stool, SOB or blurry vision. Pt denies any current chest pain or headache. Pt denies any falls or episodes of syncope. Pt reports that she last felt normal yesterday. Last BM was prior to colonoscopy. Pt denies any constipation prior to colonoscopy. Pt states that she has taken Miralax with no relief.  PCP: Dr. Lodema Hong GI: Dr. Jena Gauss  Past Medical History  Diagnosis Date  . Takotsubo cardiomyopathy 2007    Resolved  . S/P cardiac catheterization 2007    normal coronaries  . Cancer     ?uterine cancer  . Anxiety   . Cataract   . Arthritis     Past Surgical History  Procedure Date  . Abdominal hysterectomy     1966  . Appendectomy   . Cataract extraction w/phaco 10/12/2011    Procedure: CATARACT EXTRACTION PHACO AND INTRAOCULAR LENS PLACEMENT (IOC);  Surgeon: [name], MD;  Location: [facility];  Service: Ophthalmology;  Laterality: Left;  CDE:18.57  . Cataract extraction w/phaco 10/23/2011    Procedure: CATARACT EXTRACTION PHACO AND  INTRAOCULAR LENS PLACEMENT (IOC);  Surgeon: [name], MD;  Location: [facility];  Service: Ophthalmology;  Laterality: Right;  CDE 18.09  . Colonoscopy 01/31/2012    Procedure: COLONOSCOPY;  Surgeon: Corbin Ade, MD;  Location: [facility];  Service: Endoscopy;  Laterality: N/A;  1:45    Family History  Problem Relation Age of Onset  . Cancer Mother     Ovarian  . Colon cancer Neg Hx   . Anesthesia problems Neg Hx   . Hypotension Neg Hx   . Malignant hyperthermia Neg Hx   . Pseudochol deficiency Neg Hx   . Cancer Sister     Breast  . Heart attack Father     History  Substance Use Topics  . Smoking status: Never Smoker   . Smokeless tobacco: Not on file  . Alcohol Use: No    OB History    Grav Para Term Preterm Abortions TAB SAB Ect Mult Living                  Review of Systems A complete 10 system review of systems was obtained and all systems are negative except as noted in the HPI and PMH.   Allergies  Review of patient's allergies indicates no known allergies.  Home Medications   Current Outpatient Rx  Name Route Sig Dispense Refill  . MELOXICAM 15 MG PO TABS Oral Take 1 tablet (15 mg total) by mouth daily. 30 tablet 4  . SOLIFENACIN SUCCINATE 5 MG PO TABS Oral Take  1 tablet (5 mg total) by mouth daily. 90 tablet 1    BP 114/59  Pulse 94  Temp 97.9 F (36.6 C) (Oral)  Resp 17  Ht 5\' 4"  (1.626 m)  Wt 140 lb (63.504 kg)  BMI 24.03 kg/m2  SpO2 96%  Physical Exam  Nursing note and vitals reviewed. Constitutional: She is oriented to person, place, and time. She appears well-developed and well-nourished. No distress.  HENT:  Head: Normocephalic and atraumatic.  Right Ear: External ear normal.  Left Ear: External ear normal.  Nose: Nose normal.  Mouth/Throat: Oropharynx is clear and moist.  Eyes: Conjunctivae and EOM are normal. Pupils are equal, round, and reactive to light.       Visual fields fully confrontational.   Neck: Normal range of motion.  Neck supple. No tracheal deviation present.  Cardiovascular: Normal rate, regular rhythm and normal heart sounds.   Pulmonary/Chest: Effort normal and breath sounds normal. No respiratory distress.  Abdominal: Soft. Bowel sounds are normal. She exhibits no distension. There is tenderness. There is no rebound and no guarding.       Mild LLQ tenderness.  Musculoskeletal: Normal range of motion. She exhibits no edema.  Neurological: She is alert and oriented to person, place, and time. No sensory deficit.       Cranial nerves 3-12 intact. 5/5 strength throughout. No ataxia.   Skin: Skin is warm and dry.  Psychiatric: She has a normal mood and affect. Her behavior is normal.    ED Course  Procedures (including critical care time)  DIAGNOSTIC STUDIES: Oxygen Saturation is 96% on room air, normal by my interpretation.    COORDINATION OF CARE:  07:45-Discussed planned course of treatment with the patient including IV fluids, nausea medication, CT of abdomen, UA and blood work, who is agreeable at this time.   07:46-Preformed rectal exam with chaperon present.  08:00-Medication Orders: 0.9% sodium chloride infusion-once; Sodium chloride 0.9% bolus 1,000 mL-once; Ondansetron (Zofran) injection 4 mg-once.   12:02-Consultations with [facility] cardiology. Discussed pt's case and elevated second Troponin. Will repeat EKG and order further labs.   12:10-Dr. Milagros Reap, cardiology, to see pt here in ED.   Results for orders placed during the hospital encounter of 02/08/12  CBC WITH DIFFERENTIAL      Component Value Range   WBC 7.7  4.0 - 10.5 K/uL   RBC 4.51  3.87 - 5.11 MIL/uL   Hemoglobin 13.0  12.0 - 15.0 g/dL   HCT 19.1  47.8 - 29.5 %   MCV 84.9  78.0 - 100.0 fL   MCH 28.8  26.0 - 34.0 pg   MCHC 33.9  30.0 - 36.0 g/dL   RDW 62.1  30.8 - 65.7 %   Platelets 175  150 - 400 K/uL   Neutrophils Relative 73  43 - 77 %   Neutro Abs 5.6  1.7 - 7.7 K/uL   Lymphocytes Relative 18  12 - 46 %     Lymphs Abs 1.4  0.7 - 4.0 K/uL   Monocytes Relative 8  3 - 12 %   Monocytes Absolute 0.7  0.1 - 1.0 K/uL   Eosinophils Relative 0  0 - 5 %   Eosinophils Absolute 0.0  0.0 - 0.7 K/uL   Basophils Relative 0  0 - 1 %   Basophils Absolute 0.0  0.0 - 0.1 K/uL  COMPREHENSIVE METABOLIC PANEL      Component Value Range   Sodium 137  135 - 145 mEq/L  Potassium 4.0  3.5 - 5.1 mEq/L   Chloride 103  96 - 112 mEq/L   CO2 25  19 - 32 mEq/L   Glucose, Bld 132 (*) 70 - 99 mg/dL   BUN 22  6 - 23 mg/dL   Creatinine, Ser 1.61  0.50 - 1.10 mg/dL   Calcium 9.8  8.4 - 09.6 mg/dL   Total Protein 7.3  6.0 - 8.3 g/dL   Albumin 3.6  3.5 - 5.2 g/dL   AST 20  0 - 37 U/L   ALT 12  0 - 35 U/L   Alkaline Phosphatase 62  39 - 117 U/L   Total Bilirubin 0.3  0.3 - 1.2 mg/dL   GFR calc non Af Amer 64 (*) >90 mL/min   GFR calc Af Amer 74 (*) >90 mL/min  LIPASE, BLOOD      Component Value Range   Lipase 41  11 - 59 U/L  LACTIC ACID, PLASMA      Component Value Range   Lactic Acid, Venous 1.2  0.5 - 2.2 mmol/L  TROPONIN I      Component Value Range   Troponin I <0.30  <0.30 ng/mL  URINALYSIS, ROUTINE W REFLEX MICROSCOPIC      Component Value Range   Color, Urine YELLOW  YELLOW   APPearance CLEAR  CLEAR   Specific Gravity, Urine 1.025  1.005 - 1.030   pH 5.5  5.0 - 8.0   Glucose, UA NEGATIVE  NEGATIVE mg/dL   Hgb urine dipstick NEGATIVE  NEGATIVE   Bilirubin Urine NEGATIVE  NEGATIVE   Ketones, ur NEGATIVE  NEGATIVE mg/dL   Protein, ur NEGATIVE  NEGATIVE mg/dL   Urobilinogen, UA 0.2  0.0 - 1.0 mg/dL   Nitrite NEGATIVE  NEGATIVE   Leukocytes, UA SMALL (*) NEGATIVE  URINE MICROSCOPIC-ADD ON      Component Value Range   Squamous Epithelial / LPF RARE  RARE   WBC, UA 3-6  <3 WBC/hpf   Bacteria, UA RARE  RARE  TROPONIN I      Component Value Range   Troponin I 1.10 (*) <0.30 ng/mL  OCCULT BLOOD, POC DEVICE      Component Value Range   Fecal Occult Bld NEGATIVE      Ct Abdomen Pelvis W  Contrast  02/08/2012  *RADIOLOGY REPORT*  Clinical Data: Constipation with nausea and vomiting.  Recent colonoscopy.  History of uterine cancer and appendectomy.  CT ABDOMEN AND PELVIS WITH CONTRAST  Technique:  Multidetector CT imaging of the abdomen and pelvis was performed following the standard protocol during bolus administration of intravenous contrast.  Contrast: OMNIPAQUE IOHEXOL 300 MG/ML  SOLN  Comparison: Pelvic ultrasound 12/26/2011.  Abdominal pelvic CT 12/13/2011.  Findings: Tiny subpleural nodule in the right lower lobe on image 4 is stable.  There is mild basilar atelectasis.  No significant pleural effusion is present.  The liver, gallbladder and biliary system appears stable.  There is stable diffuse pancreatic atrophy without focal abnormality or surrounding inflammatory change.  The spleen, adrenal glands and kidneys appear normal.  The stomach is distended with ingested contrast.  There is no gastric wall thickening or small bowel abnormality.  Stool is present throughout the colon.  Previously noted right adnexal lesion has decreased in size, now measuring 2.1 x 2.2 cm on image 57 (previously 3.0 x 2.1 cm).  The left ovary appears normal.  The uterus is surgically absent.  There is no adenopathy, ascites or peritoneal nodularity.  The  bladder appears unremarkable. Pelvic phleboliths are stable.  There is stable lower lumbar spondylosis and asymmetric right hip arthropathy.  IMPRESSION:  1.  No definite acute abdominal pelvic findings.  Distension of the stomach is likely technical and related to oral contrast administration.  However, if there is clinical concern of gastric outlet obstruction, further evaluation may be warranted. 2.  Stable pancreatic atrophy. 3.  Decreased size of previously noted right adnexal lesion.  No evidence of metastatic disease.   Original Report Authenticated By: Gerrianne Scale, M.D.      No diagnosis found.    MDM  Constipation x 1 week after  colonoscopy.  This morning with vomiting x 2 and lightheadedness with standing.  No chest pain, SOB.  No change in chronic LLQ pain. Colonoscopy 01/31/12 showed polyps, diverticulosis and internal hemorroids.  Labs unremarkable, UA negative.  No evidence of bowel obstructions. Lightheadedness with standing and increase in dizziness and HR.  Given IV fluids with good response in ED. No nausea or vomiting. CT scan negative for acute pathology. Tolerating by mouth fluids. Dizziness has improved.  Second troponin 1.1. Patient denies chest pain or shortness of breath. Patient has history of Takusubo's cardiomyopathy in 2007 and had clean coronaries by catheterization at that time.  D/w Dr. Gala Romney.  He has reviewed EKGs and agrees with my interpretation. Recommends d/w Dr. Diona Browner who is in [place] today.  Dr. Diona Browner has seen patient and suspects stress induced cardiomyopathy.  He recommends transfer to [facility] for possible catheterization.  Will continue to cycle enzymes and continue heparin.   Date: 02/08/2012 759  Rate: 75  Rhythm: sinus arrhythmia  QRS Axis: normal  Intervals: normal  ST/T Wave abnormalities: normal  Conduction Disutrbances:none  Narrative Interpretation:   Old EKG Reviewed: unchanged   Date: 02/08/2012 1132  Rate: 80  Rhythm: sinus arrhythmia  QRS Axis: normal  Intervals: normal  ST/T Wave abnormalities: normal  Conduction Disutrbances:none  Narrative Interpretation:   Old EKG Reviewed: unchanged   Date: 02/08/2012   Rate: 86  Rhythm: normal sinus rhythm  QRS Axis: normal  Intervals: normal  ST/T Wave abnormalities: normal  Conduction Disutrbances:none  Narrative Interpretation:   Old EKG Reviewed: unchanged  CRITICAL CARE Performed by: Debra Villarreal   Total critical care time: 40  Critical care time was exclusive of separately billable procedures and treating other patients.  Critical care was necessary to treat or prevent imminent or  life-threatening deterioration.  Critical care was time spent personally by me on the following activities: development of treatment plan with patient and/or surrogate as well as nursing, discussions with consultants, evaluation of patient's response to treatment, examination of patient, obtaining history from patient or surrogate, ordering and performing treatments and interventions, ordering and review of laboratory studies, ordering and review of radiographic studies, pulse oximetry and re-evaluation of patient's condition.   I personally performed the services described in this documentation, which was scribed in my presence.  The recorded information has been reviewed and considered.      Debra Octave, MD 02/08/12 1453

## 2012-02-08 NOTE — Progress Notes (Signed)
Pt here in transfer from [facility]. She is not having chest pain or SOB at rest. She describes dizziness and weakness with balance problems with standing/walking.   Dr McDowell's notes reviewed.  Will ask GI to see in am.  Will f/u on echo tomorrow.  Continue to cycle enzymes. Change ASA to 81 mg with recent GI procedure. OK to continue low-dose BB but with borderline BP and low PO intake today, will hydrate gently.  She had CT with contrast, CMET ordered for am.  With balance probs, keep on bedrest for now. Possible PT eval in am.  CT results reviewed, no new/acute problems except possible constipation. Will continue to follow.

## 2012-02-08 NOTE — ED Notes (Signed)
Dr. Manus Gunning in room to discuss with pt lab results.

## 2012-02-08 NOTE — Telephone Encounter (Signed)
Patient start call stated that she was taking her to the emergency room. She's been vomiting and dizzy for the past few day she's had trouble with her bowel movements and she had a colonoscopy done. Advise her to seek emergency care if she was that weak  and cannot control emesis.

## 2012-02-08 NOTE — H&P (Signed)
Primary cardiologist: Previously Dr. Andee Villarreal (Villarreal)  Primary care physician, Dr. Jeanice Villarreal  Clinical Summary Debra Villarreal is a 78 y.o.female with past medical history of Takotsubo cardiomyopathy diagnosed in [place] following an emotionally stressful event with syncope, subsequent documentation of normal coronary arteries, and ultimately improvement in LV function from 18% up to 60% at office followup in [place] on medical therapy. She has had no cardiology followup since that time, has not been on any cardiac medications.  Her presentation is convoluted. She just recently underwent a colonoscopy secondary to left lower quadrant pain, was found to colonic polyps that were biopsied, no malignancy noted. Please refer to the pathology report for details. Procedure was performed by Debra Villarreal, reportedly she tolerated it well. Since that time she states that she has not had a bowel movement, not reporting any progressive abdominal pain, nausea, or emesis however. States that her appetite is good. She continues to have a vague intermittent left lower quadrant discomfort, reportedly is to have further evaluation by a gynecologist at some point. She had a CT scan of the abdomen and pelvis done today in the ER, showing some distention of the stomach, stable pancreatic atrophy, decreased size of a previously noted right adnexal lesion, no evidence of metastatic disease.  She experienced lightheadedness this morning after getting up, states that she passed out when she got to the ER briefly. She has had a vague, right-sided chest discomfort as well, somewhat positional. ECG reviewed showing sinus rhythm with largely nonspecific ST segment changes, minimal elevation in the high lateral leads. Nonspecific T-wave abnormalities noted. Troponin levels were obtained in the ER, initial level less than 0.30, second level up to 1.10. Total CK only 80 at this point.  No Known Allergies  Home Medications Mobic 15 mg by mouth  daily Vesicare 5 mg by mouth daily  Past Medical History  Diagnosis Date  . Takotsubo cardiomyopathy Villar[place]    Debra Villarreal, initial EF 18% improved to 60%   . Uterine cancer   . Anxiety   . Bilateral cataracts   . Arthritis     Past Surgical History  Procedure Date  . Abdominal hysterectomy     1966  . Appendectomy   . Cataract extraction w/phaco 10/12/2011    Procedure: CATARACT EXTRACTION PHACO AND INTRAOCULAR LENS PLACEMENT (IOC);  Surgeon: Debra Payor, MD;  Location: [facility];  Service: Ophthalmology;  Laterality: Left;  CDE:18.57  . Cataract extraction w/phaco 10/23/2011    Procedure: CATARACT EXTRACTION PHACO AND INTRAOCULAR LENS PLACEMENT (IOC);  Surgeon: Debra Payor, MD;  Location: [facility];  Service: Ophthalmology;  Laterality: Right;  CDE 18.09  . Colonoscopy 01/31/2012    Procedure: COLONOSCOPY;  Surgeon: Debra Ade, MD;  Location: [facility];  Service: Endoscopy;  Laterality: N/A;  1:45    Family History  Problem Relation Age of Onset  . Cancer Mother     Ovarian  . Colon cancer Neg Hx   . Anesthesia problems Neg Hx   . Hypotension Neg Hx   . Malignant hyperthermia Neg Hx   . Pseudochol deficiency Neg Hx   . Cancer Sister     Breast  . Heart attack Father     Social History Debra Villarreal reports that she has never smoked. She does not have any smokeless tobacco history on file. Debra Villarreal reports that she does not drink alcohol.  Review of Systems No palpitations or regular exertional chest pain. No peripheral edema, orthopnea, PND. No recent fevers  or chills. No reported melena or hematochezia. Otherwise as outlined above.  Physical Examination Temp:  [97.9 F (36.6 C)-98.7 F (37.1 C)] 98.7 F (37.1 C) (08/29 1304) Pulse Rate:  [77-94] 90  (08/29 1304) Resp:  [17-20] 20  (08/29 1304) BP: (107-147)/(59-74) 115/69 mmHg (08/29 1304) SpO2:  [96 %-98 %] 98 % (08/29 1304) Weight:  [140 lb (63.504 kg)] 140 lb (63.504 kg) (08/29 0737)  Elderly woman  in no acute distress. HEENT: Conjunctiva and lids normal, oropharynx clear. Neck: Supple, no elevated JVP or carotid bruits, no thyromegaly. Lungs: Clear to auscultation, nonlabored breathing at rest. Cardiac: Regular rate and rhythm, no S3 or significant systolic murmur, no pericardial rub. Abdomen: Soft, mild tenderness left lower quadrant, bowel sounds diminished, no guarding or rebound. Extremities: No pitting edema, distal pulses 2+. Skin: Warm and dry. Musculoskeletal: No kyphosis. Neuropsychiatric: Alert and oriented x3, affect grossly appropriate.   Lab Results Basic Metabolic Panel:  Lab 02/08/12 1610  NA 137  K 4.0  CL 103  CO2 25  GLUCOSE 132*  BUN 22  CREATININE 0.86  CALCIUM 9.8  MG --  PHOS --   Liver Function Tests:  Lab 02/08/12 0815  AST 20  ALT 12  ALKPHOS 62  BILITOT 0.3  PROT 7.3  ALBUMIN 3.6    Lab 02/08/12 0815  LIPASE 41  AMYLASE --   CBC:  Lab 02/08/12 0815  WBC 7.7  NEUTROABS 5.6  HGB 13.0  HCT 38.3  MCV 84.9  PLT 175   Cardiac Enzymes:  Lab 02/08/12 1208 02/08/12 1047 02/08/12 0815  CKTOTAL 80 -- --  CKMB 6.1* -- --  CKMBINDEX -- -- --  TROPONINI 1.56* 1.10* <0.30    Impression  1. Elevated troponin with history of Takotsubo cardiomyopathy as outlined above. Her presentation is atypical in general. ECG is largely nonspecific with ST segment changes as outlined. She is hemodynamically stable otherwise. At this point presume NSTEMII, type II, possibly recurrent stress-induced cardiomyopathy. She had normal coronary arteries at catheterization [place].  2. Recent intermittent left lower quadrant pain, etiology not yet certain. She underwent colonoscopy per Debra Villarreal back on the 21st with findings of colonic polyps that were benign. Reportedly, she is to have gynecology followup for further assessment.  3. Change in bowel status, reporting no bowel movements over the last week since colonoscopy. CT of the abdomen and pelvis noted  above. She does have some distention of the stomach, has reported no emesis however. Liver function tests are normal.  4. History of syncope, suspect neurocardiogenic with episodes over the years.   Recommendations  Discussed with patient and family present. Plan is to transfer her to our service at [facility] for further observation and management. She will be placed on aspirin, beta blocker, and heparin for now. Echocardiogram will be obtained to reassess LV function and evaluate for wall motion abnormalities. Full set of cardiac markers to be obtained. Would also suggest gastroenterology consultation for further evaluation of her bowel changes noted above. If her cardiac markers trend continues to increase, will need to consider repeat coronary angiography to exclude any obstructive stenoses, although would still suspect stress-induced cardiomyopathy at this point based on her history. Alternatively, cardiac MRI might be a consideration for further diagnostic purposes. She is accepted in transfer by Dr. Gala Romney.   Jonelle Sidle, M.D., F.A.C.C.

## 2012-02-08 NOTE — ED Notes (Signed)
Dr Manus Gunning notified of lab values. CKMB 6.1 TROPONIN 1.56

## 2012-02-08 NOTE — ED Notes (Signed)
Pt assisted to BR, pt very unsteady and c/o dizziness, pt assisted back to bed and placed back on monitor, NP with cardiology in room with pt and family

## 2012-02-08 NOTE — ED Notes (Signed)
Report called to Gerald Dexter RN, [facility] 2600 floor. Report called to [facility] RN carelink.

## 2012-02-08 NOTE — ED Notes (Signed)
Orthostatic vital signs completed on pt. Pt did well while lying and sitting. Assisted pt to standing position. Pt stated that she was weak and feeling lightheaded. NT had arms placed around pt. Pt appeared to have a syncopal episode and eased herself tot he floor with staff holding onto pt. Pt's eyes were closed and appeared to have had a blackout. Sternal rub was done on pt. Staff called out for help and EMT came in to assist staff in returning pt to bed. Pt is fine now. NAD noted at this time. RN and MD made aware of this event.

## 2012-02-09 ENCOUNTER — Encounter (HOSPITAL_COMMUNITY)
Admission: EM | Disposition: A | Discharge status: Home / Self Care | Payer: Self-pay | Source: Home / Self Care | Attending: Internal Medicine

## 2012-02-09 DIAGNOSIS — R079 Chest pain, unspecified: Secondary | ICD-10-CM

## 2012-02-09 DIAGNOSIS — I369 Nonrheumatic tricuspid valve disorder, unspecified: Secondary | ICD-10-CM

## 2012-02-09 HISTORY — PX: LEFT HEART CATHETERIZATION WITH CORONARY ANGIOGRAM: SHX5451

## 2012-02-09 LAB — COMPREHENSIVE METABOLIC PANEL
CO2: 26 mEq/L (ref 19–32)
Calcium: 9.2 mg/dL (ref 8.4–10.5)
Chloride: 105 mEq/L (ref 96–112)
Creatinine, Ser: 0.76 mg/dL (ref 0.50–1.10)
GFR calc Af Amer: >90 mL/min (ref >90)
GFR calc non Af Amer: 79 mL/min — ABNORMAL LOW (ref >90)
Glucose, Bld: 99 mg/dL (ref 70–99)
Total Bilirubin: 0.5 mg/dL (ref 0.3–1.2)

## 2012-02-09 LAB — CBC
HCT: 36 % (ref 36.0–46.0)
Hemoglobin: 12 g/dL (ref 12.0–15.0)
MCH: 28.8 pg (ref 26.0–34.0)
MCV: 86.3 fL (ref 78.0–100.0)
RBC: 4.17 MIL/uL (ref 3.87–5.11)
WBC: 5.6 10*3/uL (ref 4.0–10.5)

## 2012-02-09 SURGERY — LEFT HEART CATHETERIZATION WITH CORONARY ANGIOGRAM
Anesthesia: LOCAL

## 2012-02-09 MED ORDER — SODIUM CHLORIDE 0.9 % IV SOLN: 250.0000 mL | INTRAVENOUS | Status: DC | PRN

## 2012-02-09 MED ORDER — ONDANSETRON HCL 4 MG/2ML IJ SOLN: 4.0000 mg | Freq: Four times a day (QID) | INTRAMUSCULAR | Status: DC | PRN

## 2012-02-09 MED ORDER — ACETAMINOPHEN 325 MG PO TABS
650.0000 mg | ORAL_TABLET | ORAL | Status: DC | PRN
  Administered 2012-02-10: 03:00:00 325 mg via ORAL
  Filled 2012-02-09: qty 2

## 2012-02-09 MED ORDER — MIDAZOLAM HCL 2 MG/2ML IJ SOLN
INTRAMUSCULAR | Status: AC
  Filled 2012-02-09: qty 2

## 2012-02-09 MED ORDER — SODIUM CHLORIDE 0.9 % IJ SOLN: 3.0000 mL | INTRAMUSCULAR | Status: DC | PRN

## 2012-02-09 MED ORDER — ASPIRIN 81 MG PO CHEW
324.0000 mg | CHEWABLE_TABLET | ORAL | Status: AC
  Administered 2012-02-09: 324 mg via ORAL
  Filled 2012-02-09: qty 4

## 2012-02-09 MED ORDER — NITROGLYCERIN 0.2 MG/ML ON CALL CATH LAB
INTRAVENOUS | Status: AC
  Filled 2012-02-09: qty 1

## 2012-02-09 MED ORDER — SODIUM CHLORIDE 0.9 % IV BOLUS (SEPSIS)
250.0000 mL | Freq: Once | INTRAVENOUS | Status: AC
  Administered 2012-02-09: 250 mL via INTRAVENOUS

## 2012-02-09 MED ORDER — LIDOCAINE HCL (PF) 1 % IJ SOLN
INTRAMUSCULAR | Status: AC
  Filled 2012-02-09: qty 30

## 2012-02-09 MED ORDER — SODIUM CHLORIDE 0.9 % IJ SOLN
3.0000 mL | Freq: Two times a day (BID) | INTRAMUSCULAR | Status: DC
  Administered 2012-02-09: 3 mL via INTRAVENOUS

## 2012-02-09 MED ORDER — FENTANYL CITRATE 0.05 MG/ML IJ SOLN
INTRAMUSCULAR | Status: AC
  Filled 2012-02-09: qty 2

## 2012-02-09 MED ORDER — SODIUM CHLORIDE 0.9 % IV SOLN: INTRAVENOUS | Status: AC

## 2012-02-09 MED ORDER — HEPARIN (PORCINE) IN NACL 2-0.9 UNIT/ML-% IJ SOLN
INTRAMUSCULAR | Status: AC
  Filled 2012-02-09: qty 2000

## 2012-02-09 MED ORDER — HEPARIN (PORCINE) IN NACL 100-0.45 UNIT/ML-% IJ SOLN
1000.0000 [IU]/h | INTRAMUSCULAR | Status: DC
  Administered 2012-02-09: 1000 [IU]/h via INTRAVENOUS
  Filled 2012-02-09: qty 250

## 2012-02-09 NOTE — Consult Note (Signed)
ANTICOAGULATION CONSULT NOTE - Follow up Consult  Pharmacy Consult for IV Heparin Indication: chest pain/ACS/ STEMI  No Known Allergies  Patient Measurements: Height: 5\' 2"  (157.5 cm) Weight: 144 lb 10 oz (65.6 kg) IBW/kg (Calculated) : 50.1   Vital Signs: Temp: 97.5 F (36.4 C) (08/30 0838) Temp src: Oral (08/30 0838) BP: 102/55 mmHg (08/30 1021) Pulse Rate: 91  (08/30 1021)  Labs:  Basename 02/09/12 1200 02/09/12 0520 02/08/12 2240 02/08/12 1752 02/08/12 1208 02/08/12 0815  HGB -- 12.0 -- -- -- 13.0  HCT -- 36.0 -- -- -- 38.3  PLT -- 169 -- -- -- 175  APTT -- -- -- -- 30 --  LABPROT 14.1 -- -- -- -- --  INR 1.07 -- -- -- -- --  HEPARINUNFRC 0.31 0.45 -- -- -- --  CREATININE -- 0.76 -- -- -- 0.86  CKTOTAL -- -- -- 111 80 --  CKMB -- -- -- 10.4* 6.1* --  TROPONINI -- 1.12* 2.44* 2.03* -- --   Estimated Creatinine Clearance: 52.3 ml/min (by C-G formula based on Cr of 0.76). Medications:  Infusions:     . sodium chloride 50 mL/hr at 02/09/12 0557  . heparin 1,000 Units/hr (02/09/12 1043)  . DISCONTD: heparin 16 Units/kg/hr (02/08/12 1846)   Assessment: 78yo female presented to [facility] ED with ACS/STEMI.  Second heparin level drawn this morning is therapeutic at 0.31.  Hemoglobin and platelets are stable Goal of Therapy:  Heparin level 0.3-0.7 units/ml Monitor platelets by anticoagulation protocol: Yes   Plan:  Continue heparin at 1000 units/hour. Follow up after cardiac cath. CBC daily while on heparin per protocol.  Mickeal Skinner 02/09/2012,1:29 PM

## 2012-02-09 NOTE — Progress Notes (Signed)
*  PRELIMINARY RESULTS* Echocardiogram 2D Echocardiogram has been performed.  Jeryl Columbia 02/09/2012, 9:56 AM

## 2012-02-09 NOTE — Progress Notes (Signed)
Pt BP down to 77/45 and upon recheck 82/61. Dr. Adolm Joseph notified and new order carried out.

## 2012-02-09 NOTE — CV Procedure (Signed)
    Cardiac Cath Note  Debra Villarreal [id number] [birth year]  Procedure: Left  Heart Cardiac Catheterization Note Indications: Chest pain, + cardiac enzymes  Procedure Details Consent: Obtained Time Out: Verified patient identification, verified procedure, site/side was marked, verified correct patient position, special equipment/implants available, Radiology Safety Procedures followed,  medications/allergies/relevent history reviewed, required imaging and test results available.  Performed   Medications: Fentanyl: 25 mcg IV Versed: 1 mg IV  The right femoral artery was easily canulated using a modified Seldinger technique.  Hemodynamics:    LV pressure: 117/17 Aortic pressure: 116/61  Angiography   Left Main: smooth and normal  Left anterior Descending:  Smooth and normal .  There are several small diagonals that are normal.   Left Circumflex:  Smooth and normal   Right Coronary Artery:  Moderate is size. Dominant. Smooth and normal  LV Gram: moderate - severe LV dysfunction with global hypokinesis - c/w takatsubo syndrome.  EF 30-35%  Complications: No apparent complications Patient did tolerate procedure well.  Contrast used: 50 cc  Conclusions:   1. Smooth and normal coronaries.   2. Moderate - severe LV dysfunction -c/w Takatsubo Syndrome.  Continue medical therapy.   Vesta Mixer, Montez Hageman., MD, [facility] 02/09/2012, 1:45 PM Office - [phone] Pager [phone]

## 2012-02-09 NOTE — H&P (View-Only) (Signed)
PROGRESS NOTE  Subjective:   Debra Villarreal is a 78 yo with hx of Takotsubo cardiomyopathy in 2007.  She presented with episodes of right sided chest pain.  Transferred from [facility].  She is feeling better.  Objective:    Vital Signs:   Temp:  [97.5 F (36.4 C)-98.7 F (37.1 C)] 97.5 F (36.4 C) (08/30 0838) Pulse Rate:  [72-97] 88  (08/30 0838) Resp:  [10-22] 22  (08/30 0838) BP: (77-118)/(45-77) 102/55 mmHg (08/30 0838) SpO2:  [96 %-100 %] 99 % (08/30 0838) FiO2 (%):  [2 %] 2 % (08/29 1516) Weight:  [141 lb 8.6 oz (64.2 kg)-144 lb 10 oz (65.6 kg)] 144 lb 10 oz (65.6 kg) (08/30 0838)  Last BM Date: 02/09/12   24-hour weight change: Weight change:   Weight trends: Filed Weights   02/08/12 0737 02/08/12 1651 02/09/12 0838  Weight: 140 lb (63.504 kg) 141 lb 8.6 oz (64.2 kg) 144 lb 10 oz (65.6 kg)    Intake/Output:  08/29 0701 - 08/30 0700 In: 796.2 [P.O.:60; I.V.:736.2] Out: 352 [Urine:351; Stool:1] Total I/O In: 60 [I.V.:60] Out: 500 [Urine:500]   Physical Exam: BP 102/55  Pulse 88  Temp 97.5 F (36.4 C) (Oral)  Resp 22  Ht 5\' 2"  (1.575 m)  Wt 144 lb 10 oz (65.6 kg)  BMI 26.45 kg/m2  SpO2 99%  General: Vital signs reviewed and noted. Well-developed, well-nourished, in no acute distress; alert, appropriate and cooperative .  Head: Normocephalic, atraumatic.  Eyes: conjunctivae/corneas clear.  EOM's intact.   Throat: normal  Neck: Supple. Normal carotids. No JVD  Lungs:  Clear   Heart: Regular rate,  With normal  S1 S2. No murmurs, gallops or rubs  Abdomen:  Soft, non-tender, non-distended with normoactive bowel sounds. No hepatomegaly. No rebound/guarding. No abdominal masses.  Extremities: Distal pedal pulses are 2+ .  No edema.    Neurologic: A&O X3, CN II - XII are grossly intact. Motor strength is 5/5 in the all 4 extremities.  Psych: Responds to questions appropriately with normal affect.    Labs: BMET:  Basename 02/09/12 0520 02/08/12  0815  NA 139 137  K 4.2 4.0  CL 105 103  CO2 26 25  GLUCOSE 99 132*  BUN 14 22  CREATININE 0.76 0.86  CALCIUM 9.2 9.8  MG -- --  PHOS -- --    Liver function tests:  [facility] 02/09/12 0520 02/08/12 0815  AST 28 20  ALT 12 12  ALKPHOS 54 62  BILITOT 0.5 0.3  PROT 6.6 7.3  ALBUMIN 3.3* 3.6    Basename 02/08/12 0815  LIPASE 41  AMYLASE --    CBC:  Basename 02/09/12 0520 02/08/12 0815  WBC 5.6 7.7  NEUTROABS -- 5.6  HGB 12.0 13.0  HCT 36.0 38.3  MCV 86.3 84.9  PLT 169 175    Cardiac Enzymes:  Basename 02/09/12 0520 02/08/12 2240 02/08/12 1752 02/08/12 1208  CKTOTAL -- -- 111 80  CKMB -- -- 10.4* 6.1*  TROPONINI 1.12* 2.44* 2.03* 1.56*    Coagulation Studies: No results found for this basename: LABPROT:5,INR:5 in the last 72 hours   Tele:  NSR    Medications:    Infusions:    . sodium chloride 50 mL/hr at 02/09/12 0557  . heparin    . DISCONTD: heparin 16 Units/kg/hr (02/08/12 1846)    Scheduled Medications:    . sodium chloride   Intravenous Once  . aspirin  324 mg Oral Once  .  aspirin EC  81 mg Oral Daily  . darifenacin  7.5 mg Oral Daily  . heparin  4,000 Units Intravenous Once  . metoprolol tartrate  12.5 mg Oral BID  . senna  1 tablet Oral BID  . sodium chloride  1,000 mL Intravenous Once  . sodium chloride  250 mL Intravenous Once  . sodium chloride  3 mL Intravenous Q12H  . DISCONTD: aspirin EC  325 mg Oral Daily    Assessment/ Plan:    CAD:  Presented with CP / abdominal pain and was found to have + cardiac enzymes.  This certainly could be another episode of Takotsubo syndrome but I think we need to do a cath to rule out obstructive athersclerotic disease.  She has remained NPO.  2. Abdominal pain.  Will need GI consult.  CT of abdomen and pelvis was basically unremarkable.  Disposition: cath today. Length of Stay: 1  Vesta Mixer, Montez Hageman., MD, [facility] 02/09/2012, 9:32 AM Office [phone] Pager [phone]

## 2012-02-09 NOTE — Progress Notes (Signed)
 PROGRESS NOTE  Subjective:   [name] is a 78 yo with hx of Takotsubo cardiomyopathy in 2007.  She presented with episodes of right sided chest pain.  Transferred from [facility].  She is feeling better.  Objective:    Vital Signs:   Temp:  [97.5 F (36.4 C)-98.7 F (37.1 C)] 97.5 F (36.4 C) (08/30 0838) Pulse Rate:  [72-97] 88  (08/30 0838) Resp:  [10-22] 22  (08/30 0838) BP: (77-118)/(45-77) 102/55 mmHg (08/30 0838) SpO2:  [96 %-100 %] 99 % (08/30 0838) FiO2 (%):  [2 %] 2 % (08/29 1516) Weight:  [141 lb 8.6 oz (64.2 kg)-144 lb 10 oz (65.6 kg)] 144 lb 10 oz (65.6 kg) (08/30 0838)  Last BM Date: 02/09/12   24-hour weight change: Weight change:   Weight trends: Filed Weights   02/08/12 0737 02/08/12 1651 02/09/12 0838  Weight: 140 lb (63.504 kg) 141 lb 8.6 oz (64.2 kg) 144 lb 10 oz (65.6 kg)    Intake/Output:  08/29 0701 - 08/30 0700 In: 796.2 [P.O.:60; I.V.:736.2] Out: 352 [Urine:351; Stool:1] Total I/O In: 60 [I.V.:60] Out: 500 [Urine:500]   Physical Exam: BP 102/55  Pulse 88  Temp 97.5 F (36.4 C) (Oral)  Resp 22  Ht 5' 2" (1.575 m)  Wt 144 lb 10 oz (65.6 kg)  BMI 26.45 kg/m2  SpO2 99%  General: Vital signs reviewed and noted. Well-developed, well-nourished, in no acute distress; alert, appropriate and cooperative .  Head: Normocephalic, atraumatic.  Eyes: conjunctivae/corneas clear.  EOM's intact.   Throat: normal  Neck: Supple. Normal carotids. No JVD  Lungs:  Clear   Heart: Regular rate,  With normal  S1 S2. No murmurs, gallops or rubs  Abdomen:  Soft, non-tender, non-distended with normoactive bowel sounds. No hepatomegaly. No rebound/guarding. No abdominal masses.  Extremities: Distal pedal pulses are 2+ .  No edema.    Neurologic: A&O X3, CN II - XII are grossly intact. Motor strength is 5/5 in the all 4 extremities.  Psych: Responds to questions appropriately with normal affect.    Labs: BMET:  Basename 02/09/12 0520 02/08/12  0815  NA 139 137  K 4.2 4.0  CL 105 103  CO2 26 25  GLUCOSE 99 132*  BUN 14 22  CREATININE 0.76 0.86  CALCIUM 9.2 9.8  MG -- --  PHOS -- --    Liver function tests:  Basename 02/09/12 0520 02/08/12 0815  AST 28 20  ALT 12 12  ALKPHOS 54 62  BILITOT 0.5 0.3  PROT 6.6 7.3  ALBUMIN 3.3* 3.6    Basename 02/08/12 0815  LIPASE 41  AMYLASE --    CBC:  Basename 02/09/12 0520 02/08/12 0815  WBC 5.6 7.7  NEUTROABS -- 5.6  HGB 12.0 13.0  HCT 36.0 38.3  MCV 86.3 84.9  PLT 169 175    Cardiac Enzymes:  Basename 02/09/12 0520 02/08/12 2240 02/08/12 1752 02/08/12 1208  CKTOTAL -- -- 111 80  CKMB -- -- 10.4* 6.1*  TROPONINI 1.12* 2.44* 2.03* 1.56*    Coagulation Studies: No results found for this basename: LABPROT:5,INR:5 in the last 72 hours   Tele:  NSR    Medications:    Infusions:    . sodium chloride 50 mL/hr at 02/09/12 0557  . heparin    . DISCONTD: heparin 16 Units/kg/hr (02/08/12 1846)    Scheduled Medications:    . sodium chloride   Intravenous Once  . aspirin  324 mg Oral Once  .   aspirin EC  81 mg Oral Daily  . darifenacin  7.5 mg Oral Daily  . heparin  4,000 Units Intravenous Once  . metoprolol tartrate  12.5 mg Oral BID  . senna  1 tablet Oral BID  . sodium chloride  1,000 mL Intravenous Once  . sodium chloride  250 mL Intravenous Once  . sodium chloride  3 mL Intravenous Q12H  . DISCONTD: aspirin EC  325 mg Oral Daily    Assessment/ Plan:    CAD:  Presented with CP / abdominal pain and was found to have + cardiac enzymes.  This certainly could be another episode of Takotsubo syndrome but I think we need to do a cath to rule out obstructive athersclerotic disease.  She has remained NPO.  2. Abdominal pain.  Will need GI consult.  CT of abdomen and pelvis was basically unremarkable.  Disposition: cath today. Length of Stay: 1  Tabby Beaston J. Arath Kaigler, Jr., MD, FACC 02/09/2012, 9:32 AM Office [phone] Pager [phone]    

## 2012-02-09 NOTE — Interval H&P Note (Signed)
History and Physical Interval Note:  02/09/2012 1:21 PM  Debra Villarreal  has presented today for surgery, with the diagnosis of cp  The various methods of treatment have been discussed with the patient and family. After consideration of risks, benefits and other options for treatment, the patient has consented to  Procedure(s) (LRB): LEFT HEART CATHETERIZATION WITH CORONARY ANGIOGRAM (N/A) as a surgical intervention .  The patient's history has been reviewed, patient examined, no change in status, stable for surgery.  I have reviewed the patient's chart and labs.  Questions were answered to the patient's satisfaction.     Elyn Aquas.

## 2012-02-09 NOTE — Consult Note (Signed)
[facility]  Patient is in cath lab. We will see her in the morning. Thank you.  Willette Cluster, NP-C / Stan Head, MD

## 2012-02-09 NOTE — Consult Note (Signed)
ANTICOAGULATION CONSULT NOTE - Follow up Consult  Pharmacy Consult for IV Heparin Indication: chest pain/ACS/ STEMI  No Known Allergies  Patient Measurements: Height: 5\' 2"  (157.5 cm) Weight: 141 lb 8.6 oz (64.2 kg) IBW/kg (Calculated) : 50.1   Vital Signs: Temp: 97.5 F (36.4 C) (08/30 0349) Temp src: Oral (08/30 0349) BP: 93/63 mmHg (08/30 0349) Pulse Rate: 77  (08/30 0349)  Labs:  Basename 02/09/12 0520 02/08/12 2240 02/08/12 1752 02/08/12 1208 02/08/12 0815  HGB 12.0 -- -- -- 13.0  HCT 36.0 -- -- -- 38.3  PLT 169 -- -- -- 175  APTT -- -- -- 30 --  LABPROT -- -- -- -- --  INR -- -- -- -- --  HEPARINUNFRC 0.45 -- -- -- --  CREATININE 0.76 -- -- -- 0.86  CKTOTAL -- -- 111 80 --  CKMB -- -- 10.4* 6.1* --  TROPONINI 1.12* 2.44* 2.03* -- --   Estimated Creatinine Clearance: 51.8 ml/min (by C-G formula based on Cr of 0.76). Medications:  Infusions:     . sodium chloride 50 mL/hr at 02/09/12 0557  . heparin 16 Units/kg/hr (02/08/12 1846)   Assessment: 78yo female presented to [facility] ED with ACS/STEMI.  First heparin level drawn this morning is therapeutic at 0.45.  Hemoglobin and platelets are stable Goal of Therapy:  Heparin level 0.3-0.7 units/ml Monitor platelets by anticoagulation protocol: Yes   Plan:  Continue heparin at 1000 units/hour. Check a heparin once more to confirm dose, then daily if therapeutic CBC daily while on heparin per protocol.  Mickeal Skinner 02/09/2012,7:52 AM

## 2012-02-10 DIAGNOSIS — R1032 Left lower quadrant pain: Secondary | ICD-10-CM

## 2012-02-10 DIAGNOSIS — I5181 Takotsubo syndrome: Principal | ICD-10-CM | POA: Diagnosis present

## 2012-02-10 DIAGNOSIS — I214 Non-ST elevation (NSTEMI) myocardial infarction: Secondary | ICD-10-CM

## 2012-02-10 LAB — CBC
HCT: 35.1 % — ABNORMAL LOW (ref 36.0–46.0)
Hemoglobin: 11.5 g/dL — ABNORMAL LOW (ref 12.0–15.0)
MCH: 28.1 pg (ref 26.0–34.0)
MCHC: 32.8 g/dL (ref 30.0–36.0)
MCV: 85.8 fL (ref 78.0–100.0)
Platelets: 168 K/uL (ref 150–400)
RBC: 4.09 MIL/uL (ref 3.87–5.11)
RDW: 12.5 % (ref 11.5–15.5)
WBC: 6 K/uL (ref 4.0–10.5)

## 2012-02-10 MED ORDER — METOPROLOL TARTRATE 12.5 MG HALF TABLET: 12.5000 mg | ORAL_TABLET | Freq: Two times a day (BID) | ORAL | Status: DC

## 2012-02-10 MED ORDER — ASPIRIN 81 MG PO TBEC: 81.0000 mg | DELAYED_RELEASE_TABLET | Freq: Every day | ORAL | Status: AC

## 2012-02-10 NOTE — Progress Notes (Signed)
TELEMETRY: Reviewed telemetry pt in NSR: Filed Vitals:   02/09/12 2340 02/10/12 0415 02/10/12 0520 02/10/12 0700  BP: 102/54 103/57 94/51 87/48   Pulse: 79 75 74 69  Temp: 98.6 F (37 C)  98.1 F (36.7 C) 98.1 F (36.7 C)  TempSrc: Oral  Oral Oral  Resp: 21 14 15 12   Height:      Weight:      SpO2: 97% 97% 97% 93%    Intake/Output Summary (Last 24 hours) at 02/10/12 0904 Last data filed at 02/10/12 0900  Gross per 24 hour  Intake    896 ml  Output      0 ml  Net    896 ml    SUBJECTIVE Patient states she feels fine today. No further chest or abdominal pain. No SOB.  LABS: Basic Metabolic Panel:  Basename 02/09/12 0520 02/08/12 0815  NA 139 137  K 4.2 4.0  CL 105 103  CO2 26 25  GLUCOSE 99 132*  BUN 14 22  CREATININE 0.76 0.86  CALCIUM 9.2 9.8  MG -- --  PHOS -- --   Liver Function Tests:  [facility] 02/09/12 0520 02/08/12 0815  AST 28 20  ALT 12 12  ALKPHOS 54 62  BILITOT 0.5 0.3  PROT 6.6 7.3  ALBUMIN 3.3* 3.6    Basename 02/08/12 0815  LIPASE 41  AMYLASE --   CBC:  Basename 02/10/12 0510 02/09/12 0520 02/08/12 0815  WBC 6.0 5.6 --  NEUTROABS -- -- 5.6  HGB 11.5* 12.0 --  HCT 35.1* 36.0 --  MCV 85.8 86.3 --  PLT 168 169 --   Cardiac Enzymes:  Basename 02/09/12 0520 02/08/12 2240 02/08/12 1752 02/08/12 1208  CKTOTAL -- -- 111 80  CKMB -- -- 10.4* 6.1*  CKMBINDEX -- -- -- --  TROPONINI 1.12* 2.44* 2.03* --    Radiology/Studies:  Ct Abdomen Pelvis W Contrast  02/08/2012  *RADIOLOGY REPORT*  Clinical Data: Constipation with nausea and vomiting.  Recent colonoscopy.  History of uterine cancer and appendectomy.  CT ABDOMEN AND PELVIS WITH CONTRAST  Technique:  Multidetector CT imaging of the abdomen and pelvis was performed following the standard protocol during bolus administration of intravenous contrast.  Contrast: OMNIPAQUE IOHEXOL 300 MG/ML  SOLN  Comparison: Pelvic ultrasound 12/26/2011.  Abdominal pelvic CT 12/13/2011.  Findings:  Tiny subpleural nodule in the right lower lobe on image 4 is stable.  There is mild basilar atelectasis.  No significant pleural effusion is present.  The liver, gallbladder and biliary system appears stable.  There is stable diffuse pancreatic atrophy without focal abnormality or surrounding inflammatory change.  The spleen, adrenal glands and kidneys appear normal.  The stomach is distended with ingested contrast.  There is no gastric wall thickening or small bowel abnormality.  Stool is present throughout the colon.  Previously noted right adnexal lesion has decreased in size, now measuring 2.1 x 2.2 cm on image 57 (previously 3.0 x 2.1 cm).  The left ovary appears normal.  The uterus is surgically absent.  There is no adenopathy, ascites or peritoneal nodularity.  The bladder appears unremarkable. Pelvic phleboliths are stable.  There is stable lower lumbar spondylosis and asymmetric right hip arthropathy.  IMPRESSION:  1.  No definite acute abdominal pelvic findings.  Distension of the stomach is likely technical and related to oral contrast administration.  However, if there is clinical concern of gastric outlet obstruction, further evaluation may be warranted. 2.  Stable pancreatic atrophy. 3.  Decreased  size of previously noted right adnexal lesion.  No evidence of metastatic disease.   Original Report Authenticated By: Gerrianne Scale, M.D.    Echo:Transthoracic Echocardiography  Patient: Debra, Villarreal MR #: [id number] Study Date: 02/09/2012 Gender: F Age: 78 Height: 157.5cm Weight: 65.3kg BSA: 1.36m^2 Pt. Status: Room: 2607  PERFORMING [place], [facility] Barrett, Rhonda REFERRING Barrett, Rhonda SONOGRAPHER Jeryl [place] cc:  ------------------------------------------------------------ LV EF: 25%  ------------------------------------------------------------ History: PMH: Takotsubo, Cardiomyopathy, Anemia, Vitamin D  NSTEMI  ------------------------------------------------------------ Study Conclusions  - Left ventricle: Hypokinesis of all segments except basally. Findings consistant with Taka Tsubo DCM The cavity size was severely dilated. Wall thickness was normal. The estimated ejection fraction was 25%. - Mitral valve: Mild regurgitation. - Atrial septum: No defect or patent foramen ovale was identified. - Tricuspid valve: Moderate regurgitation. - Pulmonary arteries: PA peak pressure: 42mm Hg (S). Transthoracic echocardiography. M-mode, complete 2D, spectral Doppler, and color Doppler. Height: Height: 157.5cm. Height: 62in. Weight: Weight: 65.3kg. Weight: 143.7lb. Body mass index: BMI: 26.3kg/m^2. Body surface area: BSA: 1.87m^2. Blood pressure: 105/55. Patient status: Inpatient. Location: Bedside.  ------------------------------------------------------------  ------------------------------------------------------------ Left ventricle: Hypokinesis of all segments except basally. Findings consistant with Taka Tsubo DCM The cavity size was severely dilated. Wall thickness was normal. The estimated ejection fraction was 25%.  ------------------------------------------------------------ Aortic valve: Mildly calcified leaflets.  ------------------------------------------------------------ Aorta: The aorta was normal, not dilated, and non-diseased.  ------------------------------------------------------------ Mitral valve: Doppler: Mild regurgitation. Peak gradient: 2mm Hg (D).  ------------------------------------------------------------ Left atrium: The atrium was normal in size.  ------------------------------------------------------------ Atrial septum: No defect or patent foramen ovale was identified.  ------------------------------------------------------------ Right ventricle: The cavity size was normal. Wall thickness was normal. Systolic function was  normal.  ------------------------------------------------------------ Pulmonic valve: Doppler: Trivial regurgitation.  ------------------------------------------------------------ Tricuspid valve: Doppler: Moderate regurgitation.  ------------------------------------------------------------ Right atrium: The atrium was normal in size.  ------------------------------------------------------------ Pericardium: The pericardium was normal in appearance.  ------------------------------------------------------------ Post procedure conclusions Ascending Aorta:  - The aorta was normal, not dilated, and non-diseased.  ------------------------------------------------------------  2D measurements Normal Doppler Normal Left ventricle measurements LVID ED, 40.3 mm 43-52 Main pulmonary chord, artery PLAX Pressure, S 42 mm =30 LVID ES, 22.2 mm 23-38 Hg chord, Left ventricle PLAX Ea, lat 8.77 cm/ ------- FS, chord, 45 % >29 ann, tiss s PLAX DP LVPW, ED 11.9 mm ------ E/Ea, lat 8.61 ------- IVS/LVPW 0.76 <1.3 ann, tiss ratio, ED DP Ventricular septum Ea, med 5.65 cm/ ------- IVS, ED 9.04 mm ------ ann, tiss s Aorta DP Root diam, 35 mm ------ E/Ea, med 13.36 ------- ED ann, tiss Left atrium DP AP dim 28 mm ------ Mitral valve AP dim 1.69 cm/m^2 <2.2 Peak E vel 75.5 cm/ ------- index s Peak A vel 47.4 cm/ ------- s Deceleratio 232 ms 150-230 n time Peak 2 mm ------- gradient, D Hg Peak E/A 1.6 ------- ratio Tricuspid valve Regurg peak 306 cm/ ------- vel s Peak RV-RA 37 mm ------- gradient, S Hg Systemic veins Estimated 5 mm ------- CVP Hg Right ventricle Pressure, S 42 mm <30 Hg  ------------------------------------------------------------ Prepared and Electronically Authenticated by  Charlton Haws 2013-08-30T12:11:05.467  PHYSICAL EXAM General: Well developed, well nourished, in no acute distress. Head: Normocephalic, atraumatic, sclera non-icteric, no  xanthomas, nares are without discharge. Neck: Negative for carotid bruits. JVD not elevated. Lungs: Clear bilaterally to auscultation without wheezes, rales, or rhonchi. Breathing is unlabored. Heart: RRR S1 S2 without murmurs, rubs, or gallops.  Abdomen: Soft, non-tender, non-distended with normoactive bowel sounds. No hepatomegaly. No rebound/guarding. No obvious abdominal  masses. Msk:  Strength and tone appears normal for age. Extremities: No clubbing, cyanosis or edema.  Distal pedal pulses are 2+ and equal bilaterally. No groin hematoma from cath site. Neuro: Alert and oriented X 3. Moves all extremities spontaneously. Psych:  Responds to questions appropriately with a normal affect.  ASSESSMENT AND PLAN: 1. Takotsubo cardiomyopathy by cardiac cath and Echo. Unclear whether this ever resolved from prior episode in 2007. Mild elevation of cardiac enzymes but normal coronaries by cath. Continue ASA and low dose beta blocker. I don't think she'll tolerate much more with low BP. Stable for discharge from cardiac standpoint. Needs follow up in our [place] office. Patient still grieving over loss of husband last year.  2. Abdominal pain. GI to see. Negative CT. If no need for inpatient workup will plan discharge later today.  3. Hypertriglyceridemia.    Principal Problem:  *Takotsubo syndrome Active Problems:  Hypertriglyceridemia  Abdominal pain    Signed, Daisi Kentner [place] MD,FACC 02/10/2012 9:04 AM

## 2012-02-10 NOTE — Discharge Summary (Addendum)
Physician Discharge Summary  Patient ID: Debra Villarreal MRN: [id number] DOB/AGE: 11/18/1931 78 y.o.  Admit date: 02/08/2012 Discharge date: 02/10/2012  Primary Cardiologist: Simona Huh, MD   Primary Discharge Diagnosis: 1 Takotsubo Syndrome  - Normal coronary arteries  - Moderately severe LVD (EF 30-35%)  2 Abdominal Pain  - Status post GI evaluation  - neg abdominal CT scan   Secondary Discharge Diagnoses: Past Medical History  Diagnosis Date  . Takotsubo cardiomyopathy 2007    Normary coronaries 2007, initial EF 18% improved to 60%   . Uterine cancer   . Anxiety   . Bilateral cataracts   . Arthritis     Reason for Admission: 78 y.o.female with past medical history of Takotsubo cardiomyopathy diagnosed in 2007, following an emotionally stressful event with syncope, subsequent documentation of normal coronary arteries, and ultimately improvement in LV function from 18% up to 60% at office followup in [place], on medical therapy. Initially seen by Dr. Diona Browner for evaluation of elevated troponin, in context of atypical symptoms and nonspecific EKG changes. Arrangements were made for subsequent transfer for further evaluation, by coronary angiography.  Procedures: Left heart cardiac catheterization  Cardiac Catheterization, 02/09/12: Angiography  Left Main: smooth and normal  Left anterior Descending: Smooth and normal . There are several small diagonals that are normal.  Left Circumflex: Smooth and normal  Right Coronary Artery: Moderate is size. Dominant. Smooth and normal  LV Gram: moderate - severe LV dysfunction with global hypokinesis - c/w takatsubo syndrome. EF 30-35%  Complications: No apparent complications  Patient did tolerate procedure well.  Contrast used: 50 cc  Conclusions:  1. Smooth and normal coronaries.  2. Moderate - severe LV dysfunction -c/w Takatsubo Syndrome.    2D Echocardiogram, 02/09/12 : Study Conclusions  - Left ventricle: Hypokinesis of  all segments except basally. Findings consistant with Taka Tsubo DCM The cavity size was severely dilated. Wall thickness was normal. The estimated ejection fraction was 25%. - Mitral valve: Mild regurgitation. - Atrial septum: No defect or patent foramen ovale was identified. - Tricuspid valve: Moderate regurgitation. - Pulmonary arteries: PA peak pressure: 42mm Hg (S). Transthoracic echocardiography. M-mode, complete 2D, spectral Doppler, and color Doppler. Height: Height: 157.5cm. Height: 62in. Weight: Weight: 65.3kg. Weight: 143.7lb. Body mass index: BMI: 26.3kg/m^2. Body surface area: BSA: 1.62m^2. Blood pressure: 105/55. Patient status: Inpatient. Location: Bedside.    Hospital Course: Following transfer from [facility], patient was maintained on a regimen consisting of IV heparin and low-dose aspirin. A request was made for formal GI evaluation for complaint of abdominal pain. Serial cardiac markers were drawn, notable for peak Troponin of 2.4 following transfer. Patient was subsequently cleared to proceed with cardiac catheterization, which yielded smooth and normal coronary arteries with moderately severe LVD (EF 30-35%), consistent with Takotsubo Syndrome.  Patient remained hemodynamically stable the following morning, with no further complaint of chest or abdominal pain. She was awaiting consultation by the GI team. She was otherwise cleared for discharge from a cardiac standpoint, with recommendation to remain on low-dose aspirin and beta blocker.  Discharge Vitals: Blood pressure 87/48, pulse 69, temperature 98.1 F (36.7 C), temperature source Oral, resp. rate 12, height 5\' 2"  (1.575 m), weight 144 lb 10 oz (65.6 kg), SpO2 93.00%.  Labs: Lab Results  Component Value Date   WBC 6.0 02/10/2012   HGB 11.5* 02/10/2012   HCT 35.1* 02/10/2012   MCV 85.8 02/10/2012   PLT 168 02/10/2012      Lab 02/09/12 0520  NA 139  K 4.2  CL 105  CO2 26  BUN 14  CREATININE 0.76  CALCIUM 9.2    ALBUMIN 3.3*  PROT 6.6  BILITOT 0.5  ALKPHOS 54  ALT 12  AST 28  GLUCOSE 99    Lab Results  Component Value Date   CHOL 187 07/10/2011   HDL 45 07/10/2011   LDLCALC 107* 07/10/2011   TRIG 177* 07/10/2011    No results found for this basename: DDIMER    No results found for this basename: TSH     Basename 02/09/12 0520 02/08/12 2240 02/08/12 1752 02/08/12 1208  CKTOTAL -- -- 111 80  CKMB -- -- 10.4* 6.1*  TROPONINI 1.12* 2.44* 2.03* --    Diagnostic Studies: Ct Abdomen Pelvis W Contrast  02/08/2012  *RADIOLOGY REPORT*  Clinical Data: Constipation with nausea and vomiting.  Recent colonoscopy.  History of uterine cancer and appendectomy.  CT ABDOMEN AND PELVIS WITH CONTRAST  Technique:  Multidetector CT imaging of the abdomen and pelvis was performed following the standard protocol during bolus administration of intravenous contrast.  Contrast: OMNIPAQUE IOHEXOL 300 MG/ML  SOLN  Comparison: Pelvic ultrasound 12/26/2011.  Abdominal pelvic CT 12/13/2011.  Findings: Tiny subpleural nodule in the right lower lobe on image 4 is stable.  There is mild basilar atelectasis.  No significant pleural effusion is present.  The liver, gallbladder and biliary system appears stable.  There is stable diffuse pancreatic atrophy without focal abnormality or surrounding inflammatory change.  The spleen, adrenal glands and kidneys appear normal.  The stomach is distended with ingested contrast.  There is no gastric wall thickening or small bowel abnormality.  Stool is present throughout the colon.  Previously noted right adnexal lesion has decreased in size, now measuring 2.1 x 2.2 cm on image 57 (previously 3.0 x 2.1 cm).  The left ovary appears normal.  The uterus is surgically absent.  There is no adenopathy, ascites or peritoneal nodularity.  The bladder appears unremarkable. Pelvic phleboliths are stable.  There is stable lower lumbar spondylosis and asymmetric right hip arthropathy.  IMPRESSION:   1.  No definite acute abdominal pelvic findings.  Distension of the stomach is likely technical and related to oral contrast administration.  However, if there is clinical concern of gastric outlet obstruction, further evaluation may be warranted. 2.  Stable pancreatic atrophy. 3.  Decreased size of previously noted right adnexal lesion.  No evidence of metastatic disease.   Original Report Authenticated By: Gerrianne Scale, M.D.      DISPOSITION: Stable condition  FOLLOW UP PLANS AND APPOINTMENTS: Discharge Orders    Future Appointments: Provider: Department: Dept Phone: Center:   02/26/2012 9:00 AM Salley Scarlet, MD [facility] [phone] [facility]     Future Orders Please Complete By Expires   Diet - low sodium heart healthy      Increase activity slowly          DISCHARGE MEDICATIONS: Medication List  As of 02/10/2012  9:09 AM   TAKE these medications         meloxicam 15 MG tablet   Commonly known as: MOBIC   Take 1 tablet (15 mg total) by mouth daily.      solifenacin 5 MG tablet   Commonly known as: VESICARE   Take 1 tablet (5 mg total) by mouth daily.            BRING ALL MEDICATIONS WITH YOU TO FOLLOW UP APPOINTMENTS  Time spent with patient to include  physician time: Greater than 30 minutes, including physician time.  SignedShara Blazing, [place] 02/10/2012, 9:09 AM Co-Sign MD

## 2012-02-10 NOTE — Discharge Summary (Signed)
Patient seen and examined and history reviewed. Agree with above findings and plan. See rounding note earlier today.  Peter JordanMD,FACC 02/10/2012 12:07 PM    

## 2012-02-10 NOTE — Consult Note (Signed)
[facility] Consultation  Requesting Provider: Dr. Elease Hashimoto Primary Care Physician:  Milinda Antis, MD Primary Gastroenterologist:  Dr. Jena Gauss  Reason for Consultation:  LLQ abdominalpain    Assessment:  Abdominal wall pain - CT Abd/pelvis is negative X 2, pelvic US negative as is recent colonoscopy this month     Recommendations: Observation, reassurance. Use MiraLax for constipation. Follow-up with PCP, possibly GYN if persists. DC home today.     HPI: Debra Villarreal is a 78 y.o. female admitted in transfer for evaluation of chest pain. She also has chronic LLQ pain that is intermittent and occurs with bending and twisting or crossing of legs. Work-up negative as above. She is concerned about something popping loose but it does not interfere with her life.   Past Medical History  Diagnosis Date  . Takotsubo cardiomyopathy 2007    Normary coronaries 2007, initial EF 18% improved to 60%   . Uterine cancer   . Anxiety   . Bilateral cataracts   . Arthritis     Past Surgical History  Procedure Date  . Abdominal hysterectomy     1966  . Appendectomy   . Cataract extraction w/phaco 10/12/2011    Procedure: CATARACT EXTRACTION PHACO AND INTRAOCULAR LENS PLACEMENT (IOC);  Surgeon: [name], MD;  Location: [facility];  Service: Ophthalmology;  Laterality: Left;  CDE:18.57  . Cataract extraction w/phaco 10/23/2011    Procedure: CATARACT EXTRACTION PHACO AND INTRAOCULAR LENS PLACEMENT (IOC);  Surgeon: [name], MD;  Location: [facility];  Service: Ophthalmology;  Laterality: Right;  CDE 18.09  . Colonoscopy 01/31/2012    Procedure: COLONOSCOPY;  Surgeon: Corbin Ade, MD;  Location: [facility];  Service: Endoscopy;  Laterality: N/A;  1:45    Prior to Admission medications   Medication Sig Start Date End Date Taking? Authorizing Provider  meloxicam (MOBIC) 15 MG tablet Take 1 tablet (15 mg total) by mouth daily. 12/28/11  Yes Salley Scarlet, MD  solifenacin (VESICARE)  5 MG tablet Take 1 tablet (5 mg total) by mouth daily. 10/30/11  Yes Kerri Perches, MD    Current Facility-Administered Medications  Medication Dose Route Frequency Provider Last Rate Last Dose  . 0.9 %  sodium chloride infusion   Intravenous Once Glynn Octave, MD      . 0.9 %  sodium chloride infusion   Intravenous Continuous Joline Salt Barrett, PA 50 mL/hr at 02/09/12 0557    . 0.9 %  sodium chloride infusion   Intravenous Continuous Vesta Mixer, MD      . acetaminophen (TYLENOL) tablet 650 mg  650 mg Oral Q4H PRN Vesta Mixer, MD   325 mg at 02/10/12 0310  . aspirin chewable tablet 324 mg  324 mg Oral Pre-Cath Vesta Mixer, MD   324 mg at 02/09/12 1045  . aspirin EC tablet 81 mg  81 mg Oral Daily Dolores Patty, MD   81 mg at 02/09/12 1021  . darifenacin (ENABLEX) 24 hr tablet 7.5 mg  7.5 mg Oral Daily Rhonda G Barrett, PA   7.5 mg at 02/09/12 1022  . fentaNYL (SUBLIMAZE) 0.05 MG/ML injection           . heparin 2-0.9 UNIT/ML-% infusion           . lidocaine (XYLOCAINE) 1 % injection           . metoprolol tartrate (LOPRESSOR) tablet 12.5 mg  12.5 mg Oral BID Joline Salt Barrett, PA   12.5 mg  at 02/09/12 1021  . midazolam (VERSED) 2 MG/2ML injection           . nitroGLYCERIN (NTG ON-CALL) 0.2 mg/mL injection           . ondansetron (ZOFRAN) injection 4 mg  4 mg Intravenous Q6H PRN Vesta Mixer, MD      . ondansetron [facility]) tablet 4 mg  4 mg Oral Q6H PRN Jodelle Gross, NP      . senna [facility]) tablet 8.6 mg  1 tablet Oral BID Jodelle Gross, NP   8.6 mg at 02/09/12 1022  . sodium chloride 0.9 % injection 3 mL  3 mL Intravenous Q12H Jodelle Gross, NP   3 mL at 02/09/12 2148  . DISCONTD: 0.9 %  sodium chloride infusion  250 mL Intravenous PRN Vesta Mixer, MD      . DISCONTD: acetaminophen (TYLENOL) tablet 650 mg  650 mg Oral Q4H PRN Darrol Jump, PA      . DISCONTD: heparin ADULT infusion 100 units/mL (25000 units/250 mL)  1,000 Units/hr Intravenous  Continuous Dolores Patty, MD 10 mL/hr at 02/09/12 1043 1,000 Units/hr at 02/09/12 1043  . DISCONTD: ondansetron (ZOFRAN) injection 4 mg  4 mg Intravenous Q6H PRN Jodelle Gross, NP      . DISCONTD: sodium chloride 0.9 % injection 3 mL  3 mL Intravenous Q12H Vesta Mixer, MD   3 mL at 02/09/12 1028  . DISCONTD: sodium chloride 0.9 % injection 3 mL  3 mL Intravenous PRN Vesta Mixer, MD        Allergies as of 02/08/2012  . (No Known Allergies)    Family History  Problem Relation Age of Onset  . Cancer Mother     Ovarian  . Colon cancer Neg Hx   . Anesthesia problems Neg Hx   . Hypotension Neg Hx   . Malignant hyperthermia Neg Hx   . Pseudochol deficiency Neg Hx   . Cancer Sister     Breast  . Heart attack Father     History   Social History  . Marital Status: Widowed    Spouse Name: N/A    Number of Children: N/A  . Years of Education: N/A   Occupational History  . Not on file.   Social History Main Topics  . Smoking status: Never Smoker   . Smokeless tobacco: Not on file  . Alcohol Use: No  . Drug Use: No  . Sexually Active: No      Review of Systems: Positive for chest pain and as stated in HPI   Physical Exam: Vital signs in last 24 hours: Temp:  [98.1 F (36.7 C)-98.6 F (37 C)] 98.1 F (36.7 C) (08/31 0700) Pulse Rate:  [69-92] 69  (08/31 0700) Resp:  [12-21] 12  (08/31 0700) BP: (80-106)/(47-68) 87/48 mmHg (08/31 0700) SpO2:  [93 %-99 %] 93 % (08/31 0700) Last BM Date: 02/09/12 General:   Alert,  Well-developed, well-nourished, pleasant and cooperative in NAD Eyes:  Sclera clear, no icterus.   Conjunctiva pink. Abdomen:  Soft, and nondistended. No masses, hepatosplenomegaly or hernias noted. She is tender when abdominal wall tension is created with coughing in the LLQ. Normal bowel sounds, without guarding, and without rebound.   Psych:  Alert and cooperative. Normal mood and affect.  Intake/Output from previous day: 08/30 0701 -  08/31 0700 In: 716 [P.O.:600; I.V.:116] Out: 500 [Urine:500]   Lab Results:  Basename 02/10/12 0510 02/09/12 0520 02/08/12  0815  WBC 6.0 5.6 7.7  HGB 11.5* 12.0 13.0  HCT 35.1* 36.0 38.3  PLT 168 169 175   BMET  Basename 02/09/12 0520 02/08/12 0815  NA 139 137  K 4.2 4.0  CL 105 103  CO2 26 25  GLUCOSE 99 132*  BUN 14 22  CREATININE 0.76 0.86  CALCIUM 9.2 9.8   LFT  Basename 02/09/12 0520  PROT 6.6  ALBUMIN 3.3*  AST 28  ALT 12  ALKPHOS 54  BILITOT 0.5  BILIDIR --  IBILI --   PT/INR  Basename 02/09/12 1200  LABPROT 14.1  INR 1.07     Studies/Results: CT abd/pelvis 02/08/11 1. No definite acute abdominal pelvic findings. Distension of the  stomach is likely technical and related to oral contrast  administration. However, if there is clinical concern of gastric  outlet obstruction, further evaluation may be warranted.  2. Stable pancreatic atrophy.  3. Decreased size of previously noted right adnexal lesion. No  evidence of metastatic disease.   Previous Endoscopies: Colonoscopy  01/31/12 2 diminutive adenomas and diverticulosis in left colon     LOS: 2 days      @Carl  Sena Slate, MD, [facility] [phone] (pager) 02/10/2012 10:23 AM@

## 2012-02-26 ENCOUNTER — Ambulatory Visit: Payer: Medicare Other | Admitting: Family Medicine

## 2012-02-26 ENCOUNTER — Ambulatory Visit (INDEPENDENT_AMBULATORY_CARE_PROVIDER_SITE_OTHER): Payer: Medicare Other | Admitting: Family Medicine

## 2012-02-26 ENCOUNTER — Encounter: Payer: Self-pay | Admitting: Family Medicine

## 2012-02-26 VITALS — BP 114/82 | HR 82 | Resp 15 | Ht 62.0 in | Wt 140.0 lb

## 2012-02-26 DIAGNOSIS — I429 Cardiomyopathy, unspecified: Secondary | ICD-10-CM

## 2012-02-26 DIAGNOSIS — R519 Headache, unspecified: Secondary | ICD-10-CM | POA: Insufficient documentation

## 2012-02-26 DIAGNOSIS — R51 Headache: Secondary | ICD-10-CM

## 2012-02-26 DIAGNOSIS — Z23 Encounter for immunization: Secondary | ICD-10-CM

## 2012-02-26 DIAGNOSIS — I428 Other cardiomyopathies: Secondary | ICD-10-CM

## 2012-02-26 DIAGNOSIS — I5181 Takotsubo syndrome: Secondary | ICD-10-CM

## 2012-02-26 DIAGNOSIS — T148XXA Other injury of unspecified body region, initial encounter: Secondary | ICD-10-CM

## 2012-02-26 NOTE — Assessment & Plan Note (Addendum)
X °

## 2012-02-26 NOTE — Progress Notes (Signed)
  Subjective:    Patient ID: Debra Villarreal, female    DOB: 10-31-1931, 78 y.o.   MRN: [id number]  HPI Patient here to followup hospital admission for chest pain. She was found to have cardiomyopathy takatsubo syndrome. She was started on metoprolol 6.25 mg twice a day she states since she has left the hospital she's had a headache on and off. It is worse when she does not eat. She's not taking any medication because of fear it would interact with her heart medications. She denies any change in vision, dizziness, shortness of breath, chest pain. She is followup with cardiology tomorrow. She is also noticed some bruising to it she has been on her aspirin  Review of Systems   GEN- denies fatigue, fever, weight loss,weakness, recent illness HEENT- denies eye drainage, change in vision, nasal discharge, CVS- denies chest pain, palpitations RESP- denies SOB, cough, wheeze ABD- denies N/V, change in stools, abd pain GU- denies dysuria, hematuria, dribbling, incontinence MSK- denies joint pain, muscle aches, injury Neuro- + headache, dizziness, syncope, seizure activity        Objective:   Physical Exam GEN- NAD, alert and oriented x3 HEENT- PERRL, EOMI, non injected sclera, pink conjunctiva, MMM, oropharynx clear, fundoscopic exam benign Neck- Supple,  CVS- RRR, no murmur RESP-CTAB ABD-NABS,soft,NT,ND  EXT- No edema Pulses- Radial, DP- 2+ Neuro-CNII-XII in tact CBG 90  Skin- few scattered areas of ecchymosis on forearm bialt       Assessment & Plan:

## 2012-02-26 NOTE — Assessment & Plan Note (Signed)
Continue ASA, d/c NSAID for now and see if this resolved, very mild splotches of bruising

## 2012-02-26 NOTE — Assessment & Plan Note (Signed)
F/u cardiology, no change to beta blocker, no CP

## 2012-02-26 NOTE — Patient Instructions (Signed)
For your headache- take Tylenol as needed Make sure to eat your blood sugar is too low STOP the Meloxicam If your headache is not better tomorrow ask the heart doctor about your medication F/U 6 weeks

## 2012-02-26 NOTE — Assessment & Plan Note (Signed)
No neurological changes, may be secondary to new beta blocker medication as she has f/u tomorrow will not change dose, possible related to her hypoglycemia, encourage regular intake, trial of tylenol, if no improvement pursue further work-up.

## 2012-02-27 ENCOUNTER — Encounter: Payer: Self-pay | Admitting: Adult Health

## 2012-02-27 ENCOUNTER — Ambulatory Visit (INDEPENDENT_AMBULATORY_CARE_PROVIDER_SITE_OTHER): Payer: Medicare Other | Admitting: Adult Health

## 2012-02-27 ENCOUNTER — Telehealth: Payer: Self-pay | Admitting: Family Medicine

## 2012-02-27 VITALS — BP 118/74 | HR 81 | Ht 62.0 in | Wt 141.0 lb

## 2012-02-27 DIAGNOSIS — I5181 Takotsubo syndrome: Secondary | ICD-10-CM

## 2012-02-27 DIAGNOSIS — F411 Generalized anxiety disorder: Secondary | ICD-10-CM

## 2012-02-27 DIAGNOSIS — F419 Anxiety disorder, unspecified: Secondary | ICD-10-CM

## 2012-02-27 MED ORDER — SOLIFENACIN SUCCINATE 5 MG PO TABS: 5.0000 mg | ORAL_TABLET | Freq: Every day | ORAL | Status: DC

## 2012-02-27 MED ORDER — NITROGLYCERIN 0.4 MG SL SUBL: 0.4000 mg | SUBLINGUAL_TABLET | SUBLINGUAL | Status: DC | PRN

## 2012-02-27 NOTE — Assessment & Plan Note (Addendum)
She was recently admitted to one hospital in August of 2013 with non-ST elevation MI in the setting of Takatusubo Syndrome. Cardiac catheterization revealed normal coronary arteries. She has had no further discomfort in her chest. She has been placed on metoprolol and is tolerating the medication without fatigue . I provided her a prescription for nitroglycerin 0.4 mg sublingual. Also given her written information concerning her cardiomyopathy. She will followup with Dr. Diona Browner in 3 months for continued assessment.

## 2012-02-27 NOTE — Patient Instructions (Addendum)
Your physician recommends that you schedule a follow-up appointment in: 3 months with Dr. Diona Browner. Your physician has recommended you make the following change in your medication: added nitroglycerin tablet to be used as needed for chest pain, 1 tablet sublingual every 5 minutes up to 3 doses. If no relief after  3rd dose, then proceed to ED for evaluation. All other medications will remain the same.

## 2012-02-27 NOTE — Telephone Encounter (Signed)
Sent in

## 2012-02-27 NOTE — Assessment & Plan Note (Signed)
Recommend anti-anxiety medications to avoid recurrent chest discomfort with her diagnosis of Takatsubo. Will defer to her primary care physician for further recommendations concerning this.

## 2012-02-27 NOTE — Progress Notes (Signed)
HPI: Debra Villarreal is a 78 year old patient we are following after admission to [facility] with non-ST elevation MI in the setting of Takatusbo syndrome. She was seen and evaluated by Dr. Diona Browner and will be est. with him in the regional office. The patient has been placed on metoprolol 12.5 mg twice a day, continues on aspirin, and has been without complaints since discharge. She admits to being easily anxious, and worries often about family and financial issues. She is followed by Dr. Jeanice Lim for primary care needs.    During hospitalization cardiac catheterization was completed revealing normal coronary arteries, with severe LV dysfunction and global hypokinesis consistent with Takatsubo syndrome. She has not been placed on any antianxiety medications.  No Known Allergies  Current Outpatient Prescriptions  Medication Sig Dispense Refill  . aspirin EC 81 MG EC tablet Take 1 tablet (81 mg total) by mouth daily.  30 tablet    . metoprolol tartrate (LOPRESSOR) 12.5 mg TABS Take 0.5 tablets (12.5 mg total) by mouth 2 (two) times daily.  30 tablet  6  . nitroGLYCERIN (NITROSTAT) 0.4 MG SL tablet Place 1 tablet (0.4 mg total) under the tongue every 5 (five) minutes as needed. Up to 3 doses. If no relief after 3rd dose, proceed to ED for evaluation  25 tablet  2  . solifenacin (VESICARE) 5 MG tablet Take 1 tablet (5 mg total) by mouth daily.  90 tablet  1  . DISCONTD: nitroGLYCERIN (NITROSTAT) 0.4 MG SL tablet Place 0.4 mg under the tongue every 5 (five) minutes as needed. Up to 3 doses. If no relief after 3rd dose, proceed to ED for evaluation       Current Facility-Administered Medications  Medication Dose Route Frequency Provider Last Rate Last Dose  . DISCONTD: nitroGLYCERIN (NITROSTAT) SL tablet 0.4 mg  0.4 mg Sublingual Q5 Min x 3 PRN Jodelle Gross, NP        Past Medical History  Diagnosis Date  . Takotsubo cardiomyopathy 2007    Normary coronaries 2007, initial EF 18% improved to  60%   . Uterine cancer   . Anxiety   . Bilateral cataracts   . Arthritis     Past Surgical History  Procedure Date  . Abdominal hysterectomy     1966  . Appendectomy   . Cataract extraction w/phaco 10/12/2011    Procedure: CATARACT EXTRACTION PHACO AND INTRAOCULAR LENS PLACEMENT (IOC);  Surgeon: [name], MD;  Location: [facility];  Service: Ophthalmology;  Laterality: Left;  CDE:18.57  . Cataract extraction w/phaco 10/23/2011    Procedure: CATARACT EXTRACTION PHACO AND INTRAOCULAR LENS PLACEMENT (IOC);  Surgeon: [name], MD;  Location: [facility];  Service: Ophthalmology;  Laterality: Right;  CDE 18.09  . Colonoscopy 01/31/2012    Procedure: COLONOSCOPY;  Surgeon: Corbin Ade, MD;  Location: [facility];  Service: Endoscopy;  Laterality: N/A;  1:45    VQQ:VZDGLO of systems complete and found to be negative unless listed above  PHYSICAL EXAM BP 118/74  Pulse 81  Ht 5\' 2"  (1.575 m)  Wt 141 lb (63.957 kg)  BMI 25.79 kg/m2  General: Well developed, well nourished, in no acute distress Head: Eyes PERRLA, No xanthomas.   Normal cephalic and atraumatic  Lungs: Clear bilaterally to auscultation and percussion. Heart: HRRR S1 S2, without MRG.  Pulses are 2+ & equal.            No carotid bruit. No JVD.  No abdominal bruits. No femoral bruits. Abdomen: Bowel  sounds are positive, abdomen soft and non-tender without masses or                  Hernia's noted. Msk:  Back normal, normal gait. Normal strength and tone for age. Extremities: No clubbing, cyanosis or edema.  DP +1 Neuro: Alert and oriented X 3. Psych:  Good affect, responds appropriately    ASSESSMENT AND PLAN

## 2012-03-01 ENCOUNTER — Ambulatory Visit: Payer: Medicare Other | Admitting: Family Medicine

## 2012-03-05 ENCOUNTER — Telehealth: Payer: Self-pay | Admitting: Cardiology

## 2012-03-05 NOTE — Telephone Encounter (Signed)
Patient states that "her heart pills don't digest right".  Would like to speak with the nurse. / tg

## 2012-03-06 NOTE — Telephone Encounter (Signed)
Patient states that she is having difficulty swallowing all of he medication.  Advised her to contact her PCP for this issue.  Pt verbalized understanding.

## 2012-03-13 NOTE — Discharge Summary (Signed)
Patient seen and examined and history reviewed. Agree with above findings and plan.  Theron Arista [facility] 03/13/2012 2:55 PM

## 2012-04-02 ENCOUNTER — Telehealth: Payer: Self-pay | Admitting: Family Medicine

## 2012-04-02 NOTE — Telephone Encounter (Signed)
Please ask what pt needs hose for, she has not had any leg swelling that I am aware of

## 2012-04-02 NOTE — Telephone Encounter (Signed)
Spoke with daughter and she is not completely sure what it going on and therefore patient will wait and address concern at appt on 10/28

## 2012-04-03 ENCOUNTER — Telehealth: Payer: Self-pay | Admitting: Cardiology

## 2012-04-03 NOTE — Telephone Encounter (Signed)
Pt states that she is getting hives from new heart medication (lopressor) she is on, she has not started taking any other medication or eating anything different. She just got her second refill and wants to know what to do.tmj

## 2012-04-03 NOTE — Telephone Encounter (Signed)
Patient advised to consult with her primary care physician for assessment.  States she does have an appointment and will let us know the outcome, as this may not be the cause.

## 2012-04-08 ENCOUNTER — Ambulatory Visit: Payer: Medicare Other | Admitting: Family Medicine

## 2012-05-02 ENCOUNTER — Ambulatory Visit (INDEPENDENT_AMBULATORY_CARE_PROVIDER_SITE_OTHER): Payer: Medicare Other | Admitting: Family Medicine

## 2012-05-02 ENCOUNTER — Encounter: Payer: Self-pay | Admitting: Family Medicine

## 2012-05-04 NOTE — Progress Notes (Signed)
Subjective:     Patient ID: Debra Villarreal, female   DOB: 1932/04/23, 78 y.o.   MRN: [id number]  HPI   Review of Systems     Objective:   Physical Exam     Assessment:         Plan:     Error, pt not seen

## 2012-05-06 ENCOUNTER — Ambulatory Visit (INDEPENDENT_AMBULATORY_CARE_PROVIDER_SITE_OTHER): Payer: Medicare Other | Admitting: Family Medicine

## 2012-05-06 ENCOUNTER — Encounter: Payer: Self-pay | Admitting: Family Medicine

## 2012-05-06 VITALS — BP 130/70 | HR 92 | Resp 15 | Ht 62.0 in | Wt 142.0 lb

## 2012-05-06 DIAGNOSIS — I429 Cardiomyopathy, unspecified: Secondary | ICD-10-CM

## 2012-05-06 DIAGNOSIS — I428 Other cardiomyopathies: Secondary | ICD-10-CM

## 2012-05-06 DIAGNOSIS — R102 Pelvic and perineal pain: Secondary | ICD-10-CM

## 2012-05-06 DIAGNOSIS — I5181 Takotsubo syndrome: Secondary | ICD-10-CM

## 2012-05-06 DIAGNOSIS — R32 Unspecified urinary incontinence: Secondary | ICD-10-CM

## 2012-05-06 DIAGNOSIS — N949 Unspecified condition associated with female genital organs and menstrual cycle: Secondary | ICD-10-CM

## 2012-05-06 DIAGNOSIS — F411 Generalized anxiety disorder: Secondary | ICD-10-CM

## 2012-05-06 DIAGNOSIS — F419 Anxiety disorder, unspecified: Secondary | ICD-10-CM

## 2012-05-06 MED ORDER — PAROXETINE HCL 10 MG PO TABS: 10.0000 mg | ORAL_TABLET | ORAL | Status: DC

## 2012-05-06 MED ORDER — MELOXICAM 15 MG PO TABS: 15.0000 mg | ORAL_TABLET | Freq: Every day | ORAL | Status: DC

## 2012-05-06 NOTE — Patient Instructions (Addendum)
Get the labs done fasting Next Week  Start the paxil for stress- take 1/2 table of your 20mg  pill until you run out Restart baby aspirin  F/U 2  months

## 2012-05-06 NOTE — Progress Notes (Signed)
  Subjective:    Patient ID: Debra Villarreal, female    DOB: 08/14/1931, 78 y.o.   MRN: [id number]  HPI Patient here to follow chronic medical problems. Her last cardiology note was reviewed. Last week she had URI symptoms this is now resolved and she only has a runny nose at this time. If she has an appointment however had to leave and was unable to make it back on time. She continues to have lower abdominal and pelvic pain on and off but states it has improved since she had a colonoscopy. She's not been taking any medications with exception of meloxicam she broke out in hives after being on metoprolol but then stopped this as well as her aspirin and her Vesicare   Review of Systems  GEN- denies fatigue, fever, weight loss,weakness, recent illness HEENT- denies eye drainage, change in vision, nasal discharge, CVS- denies chest pain, palpitations RESP- denies SOB, cough, wheeze ABD- denies N/V, change in stools, +abd pain GU- denies dysuria, hematuria, dribbling, incontinence MSK- + joint pain, muscle aches, injury Neuro- denies headache, dizziness, syncope, seizure activity      Objective:   Physical Exam  GEN- NAD, alert and oriented x3 HEENT- PERRL, EOMI, non injected sclera, pink conjunctiva, MMM, oropharynx clear, nares clear rhinorrhea Neck- Supple,no LAD CVS- RRR, no murmur RESP-CTAB ABS-NABS,soft,NT,ND EXT- No edema Pulses- Radial, DP- 2+ Psych- anxious appearing, not depressed, normal speech     Assessment & Plan:

## 2012-05-06 NOTE — Assessment & Plan Note (Signed)
No longer on BB, will f/u cards, no recent CP, restart ASA, needs FLP done

## 2012-05-06 NOTE — Assessment & Plan Note (Signed)
Pain persist, pretty benign ultrasound, s/p hysterectomy, benign GI work up with exception of mild constipation, discussed sending to GYN she wants to wait until January

## 2012-05-06 NOTE — Assessment & Plan Note (Signed)
She has a lot of anxiety, this can worsen her cardiac symptoms restart paxil at 10mg 

## 2012-05-06 NOTE — Assessment & Plan Note (Signed)
Restart vesicare

## 2012-05-08 ENCOUNTER — Telehealth: Payer: Self-pay | Admitting: Family Medicine

## 2012-05-08 NOTE — Telephone Encounter (Signed)
Restarted the asa and now spots are back. She's not taking anymore

## 2012-05-08 NOTE — Telephone Encounter (Signed)
Agree to withold aspirin

## 2012-05-21 LAB — BASIC METABOLIC PANEL
BUN: 17 mg/dL (ref 6–23)
CO2: 28 mEq/L (ref 19–32)
Glucose, Bld: 100 mg/dL — ABNORMAL HIGH (ref 70–99)
Potassium: 4.2 mEq/L (ref 3.5–5.3)

## 2012-05-21 LAB — CBC
MCHC: 33.6 g/dL (ref 30.0–36.0)
MCV: 84.6 fL (ref 78.0–100.0)
Platelets: 230 10*3/uL (ref 150–400)
RDW: 13.6 % (ref 11.5–15.5)
WBC: 6.5 10*3/uL (ref 4.0–10.5)

## 2012-05-21 LAB — LIPID PANEL
Cholesterol: 163 mg/dL (ref 0–200)
HDL: 46 mg/dL (ref >39)
LDL Cholesterol: 96 mg/dL (ref 0–99)
Total CHOL/HDL Ratio: 3.5 Ratio
Triglycerides: 103 mg/dL (ref <150)
VLDL: 21 mg/dL (ref 0–40)

## 2012-05-30 ENCOUNTER — Ambulatory Visit: Payer: Medicare Other | Admitting: Cardiology

## 2012-06-25 ENCOUNTER — Ambulatory Visit: Payer: Medicare Other | Admitting: Cardiology

## 2012-07-08 ENCOUNTER — Ambulatory Visit: Payer: Medicare Other | Admitting: Family Medicine

## 2012-07-24 ENCOUNTER — Encounter: Payer: Self-pay | Admitting: Cardiology

## 2012-07-24 ENCOUNTER — Ambulatory Visit (INDEPENDENT_AMBULATORY_CARE_PROVIDER_SITE_OTHER): Payer: Medicare Other | Admitting: Cardiology

## 2012-07-24 VITALS — BP 110/69 | HR 102 | Ht 64.0 in | Wt 141.2 lb

## 2012-07-24 DIAGNOSIS — I5181 Takotsubo syndrome: Secondary | ICD-10-CM

## 2012-07-24 NOTE — Assessment & Plan Note (Signed)
History of Tako-tsubo cardiomyopathy - recurrence with type II NSTEMI in August of last year, LVEF was 25% at that point. She is now off Lopressor, questionable allergy. Plan is to followup with an echocardiogram to reassess LV function, as she has had normalization with similar situation before. We might try low-dose Coreg next. She reports no chest pain symptoms, trying to manage her stress level.

## 2012-07-24 NOTE — Progress Notes (Signed)
   Clinical Summary Ms. Elgin is a 79 y.o.female presenting for followup. She was last seen by Ms. Lawrence NP in September 2013. She is a prior patient of Dr. Andee Lineman. Cardiac history reviewed including normal coronaries with Tako-tsubo cardiomyopathy reassessed in August 2013.  Last echocardiogram in August 2013 revealed findings consistent with Tako-tsubo cardiomyopathy, LVEF 25%, mild mitral regurgitation, moderate tricuspid regurgitation, PASP 42 mm mercury. ECG today shows sinus rhythm with heart rate in the 90s, small R prime in lead V1 and V2, no acute ST segment changes.  She reports no chest pain, NYHA class II dyspnea. She has been taken off Lopressor with concern about possible rash, although patient today states that she is not sure whether the medicine actually caused it or not. She has been able to take aspirin intermittently.  She does report dizziness sometimes if she stands quickly, was not frankly orthostatic today. Resting heart rate is in the 90s off beta blocker. She has not had followup assessment of LV function.  No Known Allergies  Current Outpatient Prescriptions  Medication Sig Dispense Refill  . aspirin EC 81 MG EC tablet Take 1 tablet (81 mg total) by mouth daily.  30 tablet    . meloxicam (MOBIC) 15 MG tablet Take 1 tablet (15 mg total) by mouth daily.  30 tablet  4  . nitroGLYCERIN (NITROSTAT) 0.4 MG SL tablet Place 1 tablet (0.4 mg total) under the tongue every 5 (five) minutes as needed. Up to 3 doses. If no relief after 3rd dose, proceed to ED for evaluation  25 tablet  2  . solifenacin (VESICARE) 5 MG tablet Take 1 tablet (5 mg total) by mouth daily.  90 tablet  1   No current facility-administered medications for this visit.    Past Medical History  Diagnosis Date  . Takotsubo cardiomyopathy 2007    Normary coronaries 2007, initial EF 18% improved to 60%   . Uterine cancer   . Anxiety   . Bilateral cataracts   . Arthritis     Social History Ms.  Follansbee reports that she has never smoked. She has never used smokeless tobacco. Ms. Gravette reports that she does not drink alcohol.  Review of Systems No palpitations or syncope. Stable appetite. No reported bleeding problems, occasional bruising.  Physical Examination Filed Vitals:   07/24/12 0900  BP: 110/69  Pulse: 102   Filed Weights   07/24/12 0840  Weight: 141 lb 4 oz (64.071 kg)   No acute distress. HEENT: Conjunctiva and lids normal, oropharynx clear. Neck: Supple, no elevated JVP or carotid bruits, no thyromegaly. Lungs: Clear to auscultation, nonlabored breathing at rest. Cardiac: Regular rate and rhythm, no S3 or significant systolic murmur, no pericardial rub. Abdomen: Soft, nontender, bowel sounds present. Extremities: No pitting edema, distal pulses 2+. Skin: Warm and dry. Musculoskeletal: No kyphosis. Neuropsychiatric: Alert and oriented x3, affect grossly appropriate.   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 She is now off Lopressor, questionable allergy. Plan is to followup with an echocardiogram to reassess LV function, as she has had normalization with similar situation before. We might try low-dose Coreg next. She reports no chest pain symptoms, trying to manage her stress level.    Jonelle Sidle, M.D., F.A.C.C.

## 2012-07-24 NOTE — Patient Instructions (Signed)
Your physician recommends that you schedule a follow-up appointment in: 3 months  Your physician has requested that you have an echocardiogram. Echocardiography is a painless test that uses sound waves to create images of your heart. It provides your doctor with information about the size and shape of your heart and how well your heart's chambers and valves are working. This procedure takes approximately one hour. There are no restrictions for this procedure.   

## 2012-07-30 ENCOUNTER — Ambulatory Visit (HOSPITAL_COMMUNITY)
Admission: RE | Admit: 2012-07-30 | Discharge: 2012-07-30 | Disposition: A | Discharge status: Home / Self Care | Payer: Medicare Other | Source: Ambulatory Visit | Attending: Cardiology | Admitting: Cardiology

## 2012-07-30 DIAGNOSIS — I2589 Other forms of chronic ischemic heart disease: Secondary | ICD-10-CM | POA: Insufficient documentation

## 2012-07-30 DIAGNOSIS — R42 Dizziness and giddiness: Secondary | ICD-10-CM | POA: Insufficient documentation

## 2012-07-30 DIAGNOSIS — I5181 Takotsubo syndrome: Secondary | ICD-10-CM

## 2012-07-30 NOTE — Progress Notes (Signed)
*  PRELIMINARY RESULTS* Echocardiogram 2D Echocardiogram has been performed.  Debra Villarreal 07/30/2012, 11:36 AM

## 2012-08-01 ENCOUNTER — Telehealth: Payer: Self-pay | Admitting: *Deleted

## 2012-08-01 MED ORDER — CARVEDILOL 3.125 MG PO TABS: 3.1250 mg | ORAL_TABLET | Freq: Two times a day (BID) | ORAL | Status: DC

## 2012-08-01 NOTE — Telephone Encounter (Signed)
Notes Recorded by Jonelle Sidle, MD on 07/31/2012 at 11:09 AM Reviewed report. LVEF has normalized at 55% compared to previous study. Will start her on Coreg 3.125 mg twice daily, since she had questionable allergy to Lopressor. She should let us know if she has any problems tolerating Coreg.       Called pt to advise results noted above, pt understood and advised to call her daughter wanda to advise her about the change in medication and the results, daughter Burman Nieves understood and will pick up new RX and make sure pt contacts office with any adverse reactions to coreg.

## 2012-10-07 ENCOUNTER — Telehealth: Payer: Self-pay | Admitting: Family Medicine

## 2012-10-07 MED ORDER — MELOXICAM 15 MG PO TABS: 15.0000 mg | ORAL_TABLET | Freq: Every day | ORAL | Status: DC

## 2012-10-07 NOTE — Telephone Encounter (Signed)
Sent in

## 2012-10-30 ENCOUNTER — Encounter: Payer: Medicare Other | Admitting: Cardiology

## 2012-10-30 ENCOUNTER — Encounter: Payer: Self-pay | Admitting: Cardiology

## 2012-10-30 NOTE — Progress Notes (Signed)
No show  This encounter was created in error - please disregard.

## 2012-12-09 ENCOUNTER — Other Ambulatory Visit: Payer: Self-pay | Admitting: Family Medicine

## 2013-01-15 ENCOUNTER — Telehealth: Payer: Self-pay | Admitting: Family Medicine

## 2013-01-15 ENCOUNTER — Ambulatory Visit: Payer: Self-pay | Admitting: Family Medicine

## 2013-01-15 NOTE — Telephone Encounter (Signed)
Pt cancelled appt

## 2013-01-15 NOTE — Telephone Encounter (Signed)
Pt is comining in for appt on Monday at 10

## 2013-01-15 NOTE — Telephone Encounter (Signed)
She is suppose to have an appt today, please confirm that she is coming, she has to have appt to get a referral

## 2013-01-17 ENCOUNTER — Encounter (HOSPITAL_COMMUNITY): Payer: Self-pay | Admitting: Emergency Medicine

## 2013-01-17 ENCOUNTER — Emergency Department (HOSPITAL_COMMUNITY)
Admission: EM | Admit: 2013-01-17 | Discharge: 2013-01-17 | Disposition: A | Discharge status: Home / Self Care | Payer: Medicare Other | Attending: Emergency Medicine | Admitting: Emergency Medicine

## 2013-01-17 DIAGNOSIS — H00016 Hordeolum externum left eye, unspecified eyelid: Secondary | ICD-10-CM

## 2013-01-17 DIAGNOSIS — Z8659 Personal history of other mental and behavioral disorders: Secondary | ICD-10-CM | POA: Insufficient documentation

## 2013-01-17 DIAGNOSIS — Z8542 Personal history of malignant neoplasm of other parts of uterus: Secondary | ICD-10-CM | POA: Insufficient documentation

## 2013-01-17 DIAGNOSIS — Z8679 Personal history of other diseases of the circulatory system: Secondary | ICD-10-CM | POA: Insufficient documentation

## 2013-01-17 DIAGNOSIS — R220 Localized swelling, mass and lump, head: Secondary | ICD-10-CM | POA: Insufficient documentation

## 2013-01-17 DIAGNOSIS — Z8669 Personal history of other diseases of the nervous system and sense organs: Secondary | ICD-10-CM | POA: Insufficient documentation

## 2013-01-17 DIAGNOSIS — H5710 Ocular pain, unspecified eye: Secondary | ICD-10-CM | POA: Insufficient documentation

## 2013-01-17 DIAGNOSIS — H00019 Hordeolum externum unspecified eye, unspecified eyelid: Secondary | ICD-10-CM | POA: Insufficient documentation

## 2013-01-17 DIAGNOSIS — H5789 Other specified disorders of eye and adnexa: Secondary | ICD-10-CM | POA: Insufficient documentation

## 2013-01-17 DIAGNOSIS — M129 Arthropathy, unspecified: Secondary | ICD-10-CM | POA: Insufficient documentation

## 2013-01-17 DIAGNOSIS — Z23 Encounter for immunization: Secondary | ICD-10-CM | POA: Insufficient documentation

## 2013-01-17 MED ORDER — TETANUS-DIPHTH-ACELL PERTUSSIS 5-2.5-18.5 LF-MCG/0.5 IM SUSP
0.5000 mL | Freq: Once | INTRAMUSCULAR | Status: AC
  Administered 2013-01-17: 0.5 mL via INTRAMUSCULAR
  Filled 2013-01-17: qty 0.5

## 2013-01-17 MED ORDER — SULFAMETHOXAZOLE-TRIMETHOPRIM 800-160 MG PO TABS: 1.0000 | ORAL_TABLET | Freq: Two times a day (BID) | ORAL | Status: DC

## 2013-01-17 NOTE — ED Notes (Signed)
Pt c/o pain/redness/swelling to left eye/cheeck x 2 days.

## 2013-01-17 NOTE — ED Notes (Signed)
Patient with no complaints at this time. Respirations even and unlabored. Skin warm/dry. Discharge instructions reviewed with patient at this time. Patient given opportunity to voice concerns/ask questions. Patient discharged at this time and left Emergency Department with steady gait.   

## 2013-01-17 NOTE — ED Provider Notes (Signed)
CSN: [id number]     Arrival date & time 01/17/13  0803 History  This chart was scribed for Rolan Bucco, MD by Bennett Scrape, ED Scribe. This patient was seen in room [facility]07[facility] and the patient's care was started at 8:22 AM.   Chief Complaint  Patient presents with  . Eye Problem    The history is provided by the patient. No language interpreter was used.    HPI Comments: Debra Villarreal is a 79 y.o. female who presents to the Emergency Department complaining of 2 days of gradual onset, gradually worsening, constant left lower eyelid pain with associated redness and swelling. Pt states that the symptoms started while she was laying in bed when she felt "something" crawling on her face. She states that she grabbed it and it bit her. She did not find what bite her afterwards. She has been using cold compresses with no improvement and denies any associated drainage. She denies any recent fevers, nausea and HAs. TD is not UTD.  PCP is Dr. Jeanice Lim.  Past Medical History  Diagnosis Date  . Takotsubo cardiomyopathy 2007    Normary coronaries 2007, initial EF 18% improved to 60%   . Uterine cancer   . Anxiety   . Bilateral cataracts   . Arthritis    Past Surgical History  Procedure Laterality Date  . Abdominal hysterectomy      1966  . Appendectomy    . Cataract extraction w/phaco  10/12/2011    Procedure: CATARACT EXTRACTION PHACO AND INTRAOCULAR LENS PLACEMENT (IOC);  Surgeon: [name], MD;  Location: [facility];  Service: Ophthalmology;  Laterality: Left;  CDE:18.57  . Cataract extraction w/phaco  10/23/2011    Procedure: CATARACT EXTRACTION PHACO AND INTRAOCULAR LENS PLACEMENT (IOC);  Surgeon: [name], MD;  Location: [facility];  Service: Ophthalmology;  Laterality: Right;  CDE 18.09  . Colonoscopy  01/31/2012    Procedure: COLONOSCOPY;  Surgeon: Corbin Ade, MD;  Location: [facility];  Service: Endoscopy;  Laterality: N/A;  1:45   Family History  Problem Relation Age of Onset   . Cancer Mother     Ovarian  . Colon cancer Neg Hx   . Anesthesia problems Neg Hx   . Hypotension Neg Hx   . Malignant hyperthermia Neg Hx   . Pseudochol deficiency Neg Hx   . Cancer Sister     Breast  . Heart attack Father    History  Substance Use Topics  . Smoking status: Never Smoker   . Smokeless tobacco: Never Used  . Alcohol Use: No   No OB history provided.  Review of Systems  Constitutional: Negative for fever, chills, diaphoresis and fatigue.  HENT: Positive for facial swelling (left eye/upper cheek). Negative for congestion, rhinorrhea and sneezing.   Eyes: Positive for pain and redness. Negative for discharge and visual disturbance.  Respiratory: Negative for cough, chest tightness and shortness of breath.   Cardiovascular: Negative for chest pain and leg swelling.  Gastrointestinal: Negative for nausea, vomiting, abdominal pain, diarrhea and blood in stool.  Genitourinary: Negative for frequency, hematuria, flank pain and difficulty urinating.  Musculoskeletal: Negative for back pain and arthralgias.  Skin: Negative for rash.  Neurological: Negative for dizziness, speech difficulty, weakness, numbness and headaches.    Allergies  Review of patient's allergies indicates no known allergies.  Home Medications   Current Outpatient Rx  Name  Route  Sig  Dispense  Refill  . aspirin EC 81 MG EC tablet   Oral  Take 1 tablet (81 mg total) by mouth daily.   30 tablet      . carvedilol (COREG) 3.125 MG tablet   Oral   Take 1 tablet (3.125 mg total) by mouth 2 (two) times daily.   60 tablet   6   . meloxicam (MOBIC) 15 MG tablet   Oral   Take 1 tablet (15 mg total) by mouth daily.   30 tablet   4   . nitroGLYCERIN (NITROSTAT) 0.4 MG SL tablet   Sublingual   Place 1 tablet (0.4 mg total) under the tongue every 5 (five) minutes as needed. Up to 3 doses. If no relief after 3rd dose, proceed to ED for evaluation   25 tablet   2   . solifenacin (VESICARE)  5 MG tablet   Oral   Take 1 tablet (5 mg total) by mouth daily.   90 tablet   1   . sulfamethoxazole-trimethoprim (BACTRIM DS,SEPTRA DS) 800-160 MG per tablet   Oral   Take 1 tablet by mouth 2 (two) times daily. One po bid x 10 days   20 tablet   0   . VESICARE 5 MG tablet      TAKE ONE TABLET BY MOUTH EVERY DAY   90 tablet   0    Triage Vitals: BP 134/66  Pulse 80  Temp(Src) 98.6 F (37 C)  Resp 18  Ht 5\' 4"  (1.626 m)  Wt 130 lb (58.968 kg)  BMI 22.3 kg/m2  SpO2 98%  Physical Exam  Nursing note and vitals reviewed. Constitutional: She is oriented to person, place, and time. She appears well-developed and well-nourished.  HENT:  Head: Normocephalic and atraumatic.  Small sty present to the medial aspect of the left lower eyelid  with mild surrounding erythema, no other facial swelling   Eyes: EOM are normal. Pupils are equal, round, and reactive to light.  Neck: Normal range of motion. Neck supple.  Cardiovascular: Normal rate, regular rhythm and normal heart sounds.   Pulmonary/Chest: Effort normal and breath sounds normal. No respiratory distress. She has no wheezes. She has no rales. She exhibits no tenderness.  Musculoskeletal: Normal range of motion. She exhibits no edema.  Lymphadenopathy:    She has no cervical adenopathy.  Neurological: She is alert and oriented to person, place, and time.  Skin: Skin is warm and dry. No rash noted.  Psychiatric: She has a normal mood and affect.    ED Course   Procedures (including critical care time)  DIAGNOSTIC STUDIES: Oxygen Saturation is 98% on room air, normal by my interpretation.    COORDINATION OF CARE: 8:25 AM-Discussed treatment plan which includes opening the are and updating TD vaccine with pt at bedside and pt agreed to plan. Advised pt to use hot compresses to help promote drainage. 8:27 AM-Opened up a small hole at the pointed head of the stye with a needle. A small amount of purulent material drained  from the opening. Provided pt with a tissue and advised her to put pressure on the area to help promote drainage. 8:29 AM-Discussed discharge plan with pt and pt agreed to plan. Also advised pt to follow up with ohpthalmologist as needed and pt agreed. Addressed symptoms to return for with pt.   Labs Reviewed - No data to display No results found. 1. Stye, left     MDM  Pt started on bactrim, TDAP updated.  Has appointment to f/u with her PMD on Monday.  Advised to return  here if symptoms worsen over the weekend.  I personally performed the services described in this documentation, which was scribed in my presence.  The recorded information has been reviewed and considered.    Rolan Bucco, MD 01/17/13 [phone]

## 2013-01-20 ENCOUNTER — Ambulatory Visit: Payer: Self-pay | Admitting: Family Medicine

## 2013-03-04 ENCOUNTER — Encounter: Payer: Self-pay | Admitting: *Deleted

## 2013-04-10 ENCOUNTER — Other Ambulatory Visit: Payer: Self-pay | Admitting: Family Medicine

## 2013-07-21 IMAGING — CT CT ABD-PELV W/ CM
2 of 4 series · 16 of 46 positions shown, 18 images · IV contrast (Omnipaque 300)
Comparison: Pelvic ultrasound 12/26/2011.  Abdominal pelvic CT
12/13/2011.

CLINICAL DATA: Constipation with nausea and vomiting.  Recent
colonoscopy.  History of uterine cancer and appendectomy.

CT ABDOMEN AND PELVIS WITH CONTRAST
TECHNIQUE: Multidetector CT imaging of the abdomen and pelvis was
performed following the standard protocol during bolus
administration of intravenous contrast.
Contrast: 100mL OMNIPAQUE IOHEXOL 300 MG/ML  SOLN

[Series 2: abd_pel_with 5.0 b40f · axial · 0.71mm/px · z∈[-320,+60]mm · 13 of 86 slices shown, 15 images]
[im 5/86  soft-tissue]
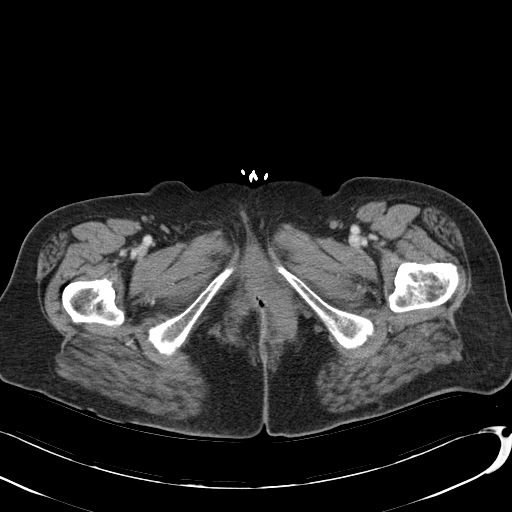
[im 5/86  bone]
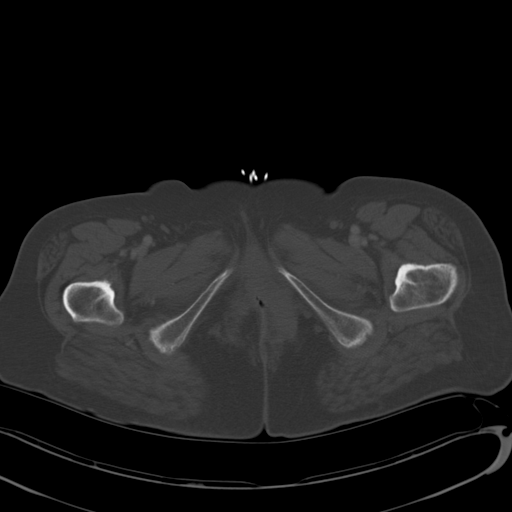
[im 13/86  soft-tissue]
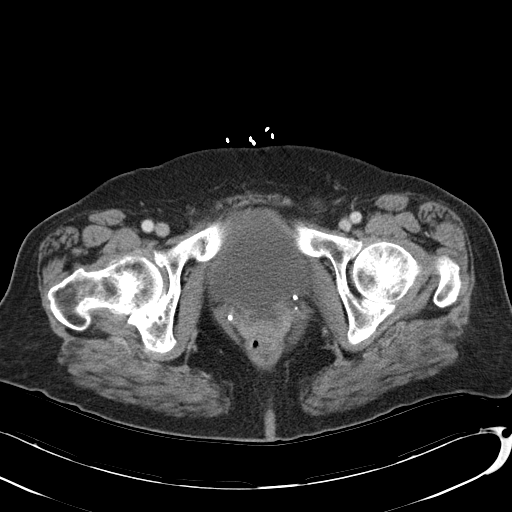
[im 17/86  soft-tissue]
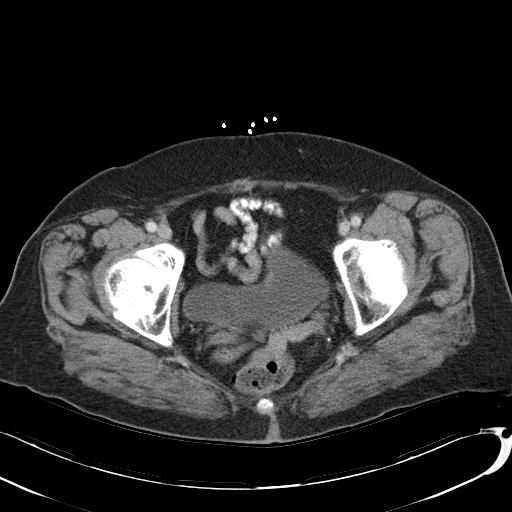
[im 25/86  soft-tissue]
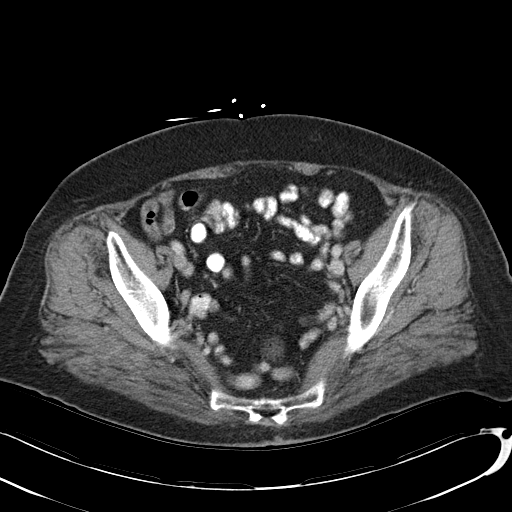
[im 29/86  soft-tissue]
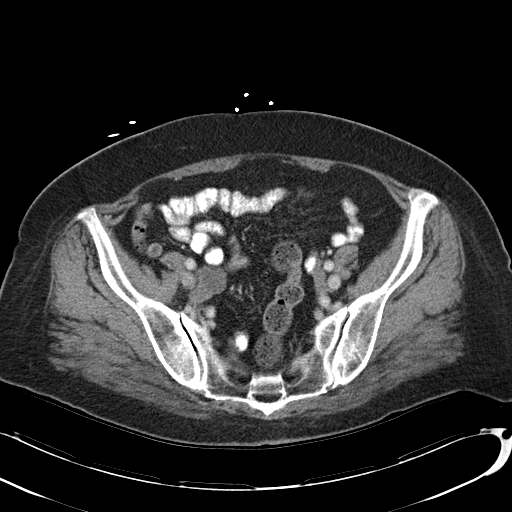
[im 37/86  soft-tissue]
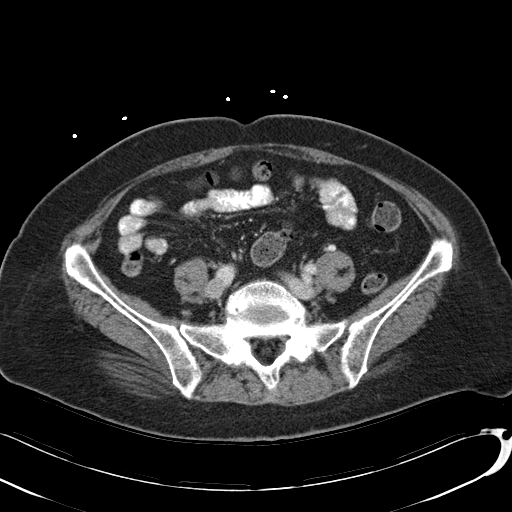
[im 45/86  soft-tissue]
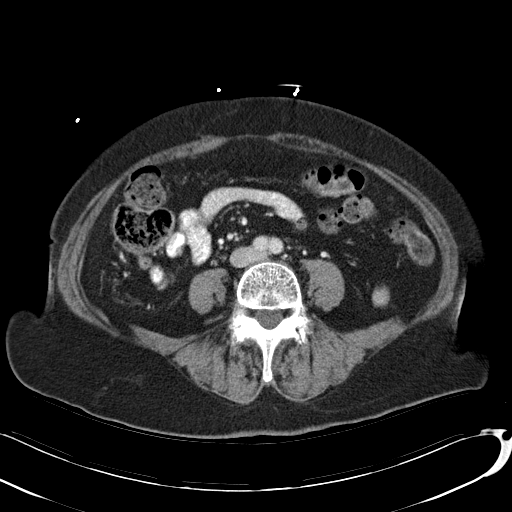
[im 49/86  soft-tissue]
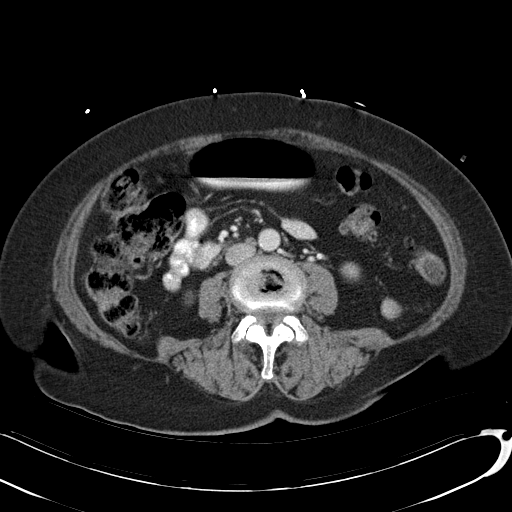
[im 57/86  soft-tissue]
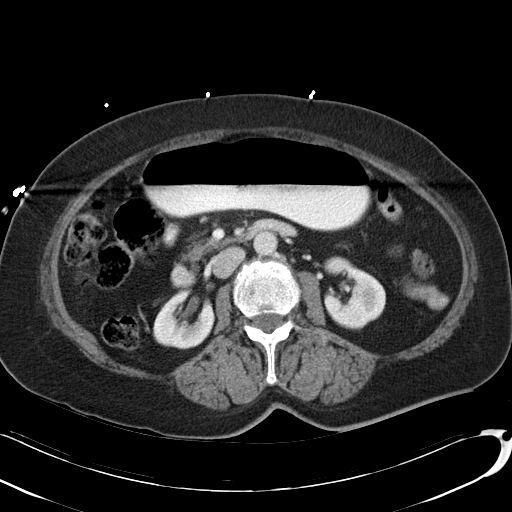
[im 57/86  bone]
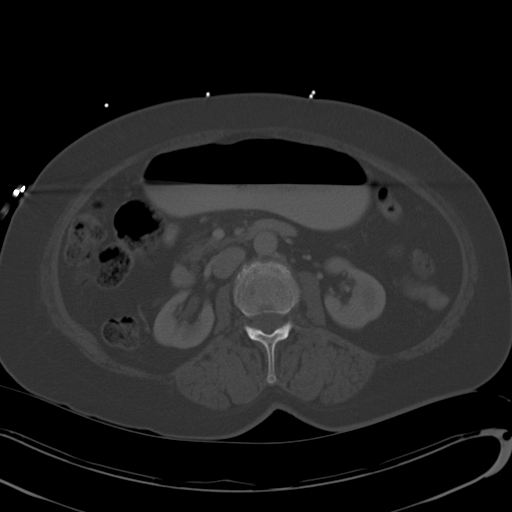
[im 61/86  soft-tissue]
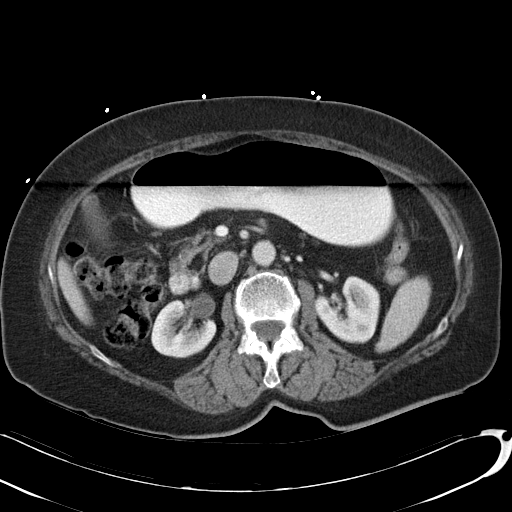
[im 69/86  soft-tissue]
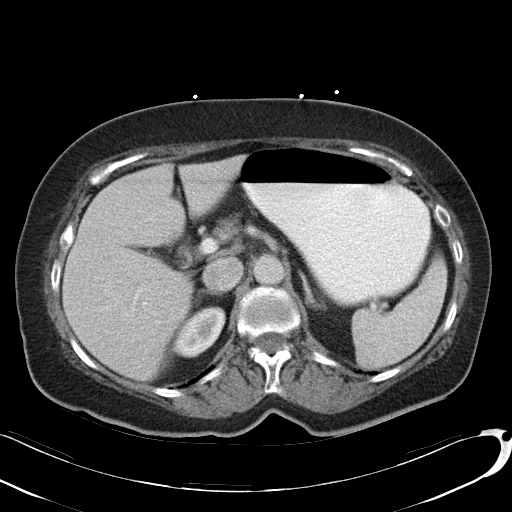
[im 73/86  soft-tissue]
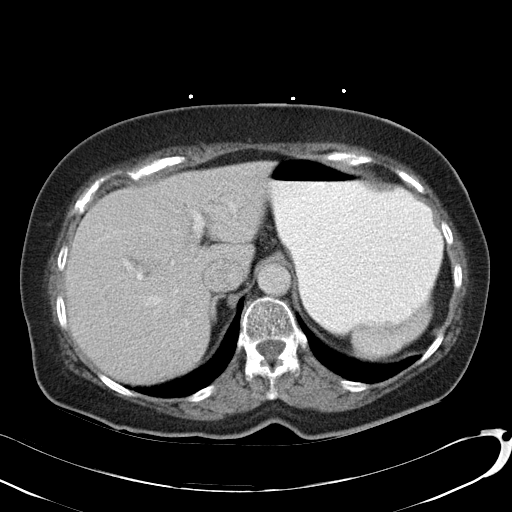
[im 81/86  soft-tissue]
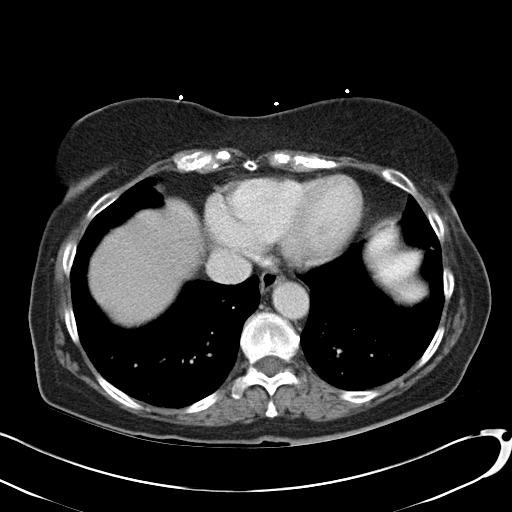

[Series 4: abd_pel_with 3.0 spo cor · coronal · 0.74mm/px · 3 of 74 slices shown]
[im 25/74  soft-tissue]
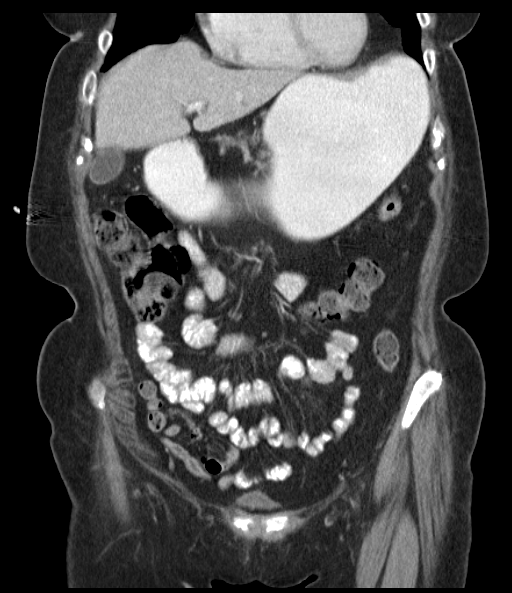
[im 33/74  soft-tissue]
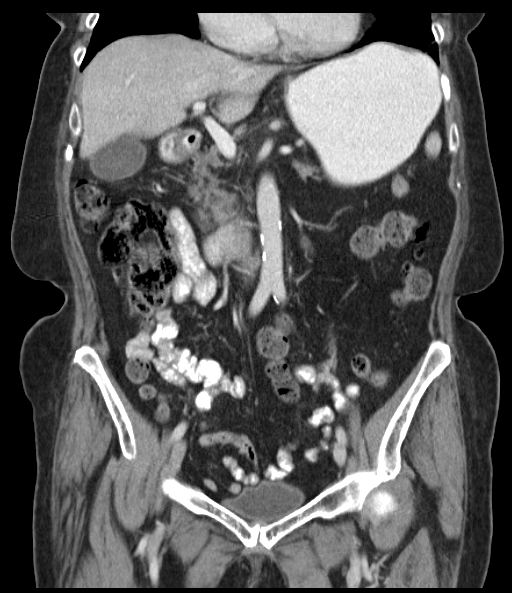
[im 41/74  soft-tissue]
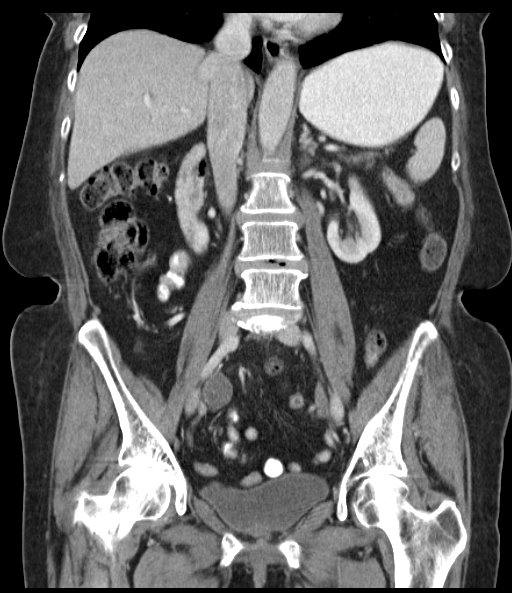

[16 of 46 positions shown; findings below may reference images not displayed]

FINDINGS: Tiny subpleural nodule in the right lower lobe on image 4
is stable.  There is mild basilar atelectasis.  No significant
pleural effusion is present.

The liver, gallbladder and biliary system appears stable.  There is
stable diffuse pancreatic atrophy without focal abnormality or
surrounding inflammatory change.  The spleen, adrenal glands and
kidneys appear normal.

The stomach is distended with ingested contrast.  There is no
gastric wall thickening or small bowel abnormality.  Stool is
present throughout the colon.  Previously noted right adnexal
lesion has decreased in size, now measuring 2.1 x 2.2 cm on image
57 (previously 3.0 x 2.1 cm).  The left ovary appears normal.  The
uterus is surgically absent.  There is no adenopathy, ascites or
peritoneal nodularity.  The bladder appears unremarkable. Pelvic
phleboliths are stable.

There is stable lower lumbar spondylosis and asymmetric right hip
arthropathy.
IMPRESSION: 1.  No definite acute abdominal pelvic findings.  Distension of the
stomach is likely technical and related to oral contrast
administration.  However, if there is clinical concern of gastric
outlet obstruction, further evaluation may be warranted.
2.  Stable pancreatic atrophy.
3.  Decreased size of previously noted right adnexal lesion.  No
evidence of metastatic disease.

## 2013-11-07 ENCOUNTER — Other Ambulatory Visit (HOSPITAL_COMMUNITY): Payer: Self-pay | Admitting: Internal Medicine

## 2013-11-07 DIAGNOSIS — M810 Age-related osteoporosis without current pathological fracture: Secondary | ICD-10-CM

## 2013-11-11 ENCOUNTER — Other Ambulatory Visit (HOSPITAL_COMMUNITY): Payer: Medicare Other

## 2013-11-14 ENCOUNTER — Ambulatory Visit (HOSPITAL_COMMUNITY)
Admission: RE | Admit: 2013-11-14 | Discharge: 2013-11-14 | Disposition: A | Discharge status: Home / Self Care | Payer: Medicare Other | Source: Ambulatory Visit | Attending: Internal Medicine | Admitting: Internal Medicine

## 2013-11-14 DIAGNOSIS — M810 Age-related osteoporosis without current pathological fracture: Secondary | ICD-10-CM | POA: Insufficient documentation

## 2014-05-21 ENCOUNTER — Encounter (HOSPITAL_COMMUNITY): Payer: Self-pay | Admitting: Cardiovascular Disease

## 2015-05-14 ENCOUNTER — Emergency Department (HOSPITAL_COMMUNITY)
Admission: EM | Admit: 2015-05-14 | Discharge: 2015-05-14 | Disposition: A | Discharge status: Home / Self Care | Payer: Medicare Other | Attending: Emergency Medicine | Admitting: Emergency Medicine

## 2015-05-14 ENCOUNTER — Encounter (HOSPITAL_COMMUNITY): Payer: Self-pay

## 2015-05-14 DIAGNOSIS — E119 Type 2 diabetes mellitus without complications: Secondary | ICD-10-CM | POA: Insufficient documentation

## 2015-05-14 DIAGNOSIS — Z79899 Other long term (current) drug therapy: Secondary | ICD-10-CM | POA: Diagnosis not present

## 2015-05-14 DIAGNOSIS — Z8659 Personal history of other mental and behavioral disorders: Secondary | ICD-10-CM | POA: Insufficient documentation

## 2015-05-14 DIAGNOSIS — Z7982 Long term (current) use of aspirin: Secondary | ICD-10-CM | POA: Diagnosis not present

## 2015-05-14 DIAGNOSIS — N390 Urinary tract infection, site not specified: Secondary | ICD-10-CM

## 2015-05-14 DIAGNOSIS — Z8679 Personal history of other diseases of the circulatory system: Secondary | ICD-10-CM | POA: Diagnosis not present

## 2015-05-14 DIAGNOSIS — Z8669 Personal history of other diseases of the nervous system and sense organs: Secondary | ICD-10-CM | POA: Diagnosis not present

## 2015-05-14 DIAGNOSIS — M1990 Unspecified osteoarthritis, unspecified site: Secondary | ICD-10-CM | POA: Insufficient documentation

## 2015-05-14 DIAGNOSIS — R1030 Lower abdominal pain, unspecified: Secondary | ICD-10-CM | POA: Diagnosis present

## 2015-05-14 DIAGNOSIS — R197 Diarrhea, unspecified: Secondary | ICD-10-CM

## 2015-05-14 DIAGNOSIS — Z8542 Personal history of malignant neoplasm of other parts of uterus: Secondary | ICD-10-CM | POA: Insufficient documentation

## 2015-05-14 HISTORY — DX: Type 2 diabetes mellitus without complications: E11.9

## 2015-05-14 LAB — CBC WITH DIFFERENTIAL/PLATELET
BASOS PCT: 0 %
Basophils Absolute: 0 10*3/uL (ref 0.0–0.1)
EOS ABS: 0 10*3/uL (ref 0.0–0.7)
EOS PCT: 0 %
HCT: 37.1 % (ref 36.0–46.0)
HEMOGLOBIN: 12.4 g/dL (ref 12.0–15.0)
Lymphocytes Relative: 8 %
Lymphs Abs: 0.7 10*3/uL (ref 0.7–4.0)
MCH: 29.3 pg (ref 26.0–34.0)
MCHC: 33.4 g/dL (ref 30.0–36.0)
MCV: 87.7 fL (ref 78.0–100.0)
MONOS PCT: 5 %
Monocytes Absolute: 0.5 10*3/uL (ref 0.1–1.0)
NEUTROS PCT: 87 %
Neutro Abs: 8.3 10*3/uL — ABNORMAL HIGH (ref 1.7–7.7)
PLATELETS: 182 10*3/uL (ref 150–400)
RBC: 4.23 MIL/uL (ref 3.87–5.11)
RDW: 12.8 % (ref 11.5–15.5)
WBC: 9.5 10*3/uL (ref 4.0–10.5)

## 2015-05-14 LAB — COMPREHENSIVE METABOLIC PANEL
ALK PHOS: 58 U/L (ref 38–126)
ALT: 11 U/L — AB (ref 14–54)
AST: 21 U/L (ref 15–41)
Albumin: 4.1 g/dL (ref 3.5–5.0)
Anion gap: 9 (ref 5–15)
BUN: 21 mg/dL — AB (ref 6–20)
CALCIUM: 9.4 mg/dL (ref 8.9–10.3)
CO2: 26 mmol/L (ref 22–32)
CREATININE: 0.77 mg/dL (ref 0.44–1.00)
Chloride: 103 mmol/L (ref 101–111)
Glucose, Bld: 112 mg/dL — ABNORMAL HIGH (ref 65–99)
Potassium: 3.9 mmol/L (ref 3.5–5.1)
SODIUM: 138 mmol/L (ref 135–145)
Total Bilirubin: 0.7 mg/dL (ref 0.3–1.2)
Total Protein: 7.6 g/dL (ref 6.5–8.1)

## 2015-05-14 LAB — URINALYSIS, ROUTINE W REFLEX MICROSCOPIC
Bilirubin Urine: NEGATIVE
Glucose, UA: NEGATIVE mg/dL
Hgb urine dipstick: NEGATIVE
Ketones, ur: NEGATIVE mg/dL
Nitrite: POSITIVE — AB
Protein, ur: NEGATIVE mg/dL
Specific Gravity, Urine: 1.015 (ref 1.005–1.030)
pH: 5.5 (ref 5.0–8.0)

## 2015-05-14 LAB — URINE MICROSCOPIC-ADD ON
RBC / HPF: NONE SEEN RBC/hpf (ref 0–5)
Squamous Epithelial / LPF: NONE SEEN

## 2015-05-14 MED ORDER — MORPHINE SULFATE (PF) 2 MG/ML IV SOLN
4.0000 mg | Freq: Once | INTRAVENOUS | Status: AC
  Administered 2015-05-14: 4 mg via INTRAVENOUS
  Filled 2015-05-14: qty 2

## 2015-05-14 MED ORDER — DEXTROSE 5 % IV SOLN
1.0000 g | Freq: Once | INTRAVENOUS | Status: AC
  Administered 2015-05-14: 1 g via INTRAVENOUS
  Filled 2015-05-14 (×2): qty 10

## 2015-05-14 MED ORDER — SODIUM CHLORIDE 0.9 % IV BOLUS (SEPSIS)
500.0000 mL | Freq: Once | INTRAVENOUS | Status: AC
  Administered 2015-05-14: 500 mL via INTRAVENOUS

## 2015-05-14 MED ORDER — LOPERAMIDE HCL 2 MG PO CAPS
4.0000 mg | ORAL_CAPSULE | Freq: Once | ORAL | Status: AC
  Administered 2015-05-14: 4 mg via ORAL
  Filled 2015-05-14: qty 2

## 2015-05-14 MED ORDER — ONDANSETRON HCL 4 MG/2ML IJ SOLN
4.0000 mg | Freq: Once | INTRAMUSCULAR | Status: AC
  Administered 2015-05-14: 4 mg via INTRAVENOUS
  Filled 2015-05-14: qty 2

## 2015-05-14 MED ORDER — CEPHALEXIN 500 MG PO CAPS: 500.0000 mg | ORAL_CAPSULE | Freq: Three times a day (TID) | ORAL | Status: DC

## 2015-05-14 NOTE — Discharge Instructions (Signed)

## 2015-05-14 NOTE — ED Notes (Signed)
Pt reports lower abd cramping and diarrhea, states cramping starts when she feels like she has to have a bm.

## 2015-05-17 LAB — URINE CULTURE: Culture: >=100000

## 2015-05-18 ENCOUNTER — Telehealth (HOSPITAL_BASED_OUTPATIENT_CLINIC_OR_DEPARTMENT_OTHER): Payer: Self-pay | Admitting: Emergency Medicine

## 2015-05-18 NOTE — Progress Notes (Signed)
ED Antimicrobial Stewardship Positive Culture Follow Up   Debra Villarreal is an 81 y.o. female who presented to [facility] on 05/14/2015 with a chief complaint of  Chief Complaint  Patient presents with  . Abdominal Pain  . Diarrhea    Recent Results (from the past 720 hour(s))  Urine culture     Status: None   Collection Time: 05/14/15  9:42 AM  Result Value Ref Range Status   Specimen Description URINE, CLEAN CATCH  Final   Special Requests NONE  Final   Culture   Final    >=100,000 COLONIES/mL STAPHYLOCOCCUS SPECIES (COAGULASE NEGATIVE) Performed at [facility]    Report Status 05/17/2015 FINAL  Final   Organism ID, Bacteria STAPHYLOCOCCUS SPECIES (COAGULASE NEGATIVE)  Final      Susceptibility   Staphylococcus species (coagulase negative) - MIC*    CIPROFLOXACIN <=0.5 SENSITIVE Sensitive     GENTAMICIN <=0.5 SENSITIVE Sensitive     NITROFURANTOIN <=16 SENSITIVE Sensitive     OXACILLIN >=4 RESISTANT Resistant     TETRACYCLINE <=1 SENSITIVE Sensitive     VANCOMYCIN <=0.5 SENSITIVE Sensitive     TRIMETH/SULFA <=10 SENSITIVE Sensitive     CLINDAMYCIN <=0.25 SENSITIVE Sensitive     RIFAMPIN <=0.5 SENSITIVE Sensitive     Inducible Clindamycin NEGATIVE Sensitive     * >=100,000 COLONIES/mL STAPHYLOCOCCUS SPECIES (COAGULASE NEGATIVE)    [x]  Treated with Keflex, organism resistant to prescribed antimicrobial []  Patient discharged originally without antimicrobial agent and treatment is now indicated  New antibiotic prescription: Bactrim DS 1 tab BID x 3 days  ED Provider: Jeannett Senior, PA   Debra Villarreal, Debra Villarreal 05/18/2015, 9:23 AM Infectious Diseases Pharmacist Phone# [phone]

## 2015-05-18 NOTE — Telephone Encounter (Signed)
Post ED Visit - Positive Culture Follow-up: Successful Patient Follow-Up  Culture assessed and recommendations reviewed by: []  Elenor Quinones, Pharm.D. []  Heide Guile, Pharm.D., BCPS []  Parks Neptune, Pharm.D. []  Alycia Rossetti, Pharm.D., BCPS []  [place], [place].D., BCPS, AAHIVP []  Legrand Como, Pharm.D., BCPS, AAHIVP []  Milus Glazier, Pharm.D. []  Stephens November, Pharm.D. [place] Better PharmD  Positive urine culture Staph  []  Patient discharged without antimicrobial prescription and treatment is now indicated [x]  Organism is resistant to prescribed ED discharge antimicrobial []  Patient with positive blood cultures  Changes discussed with ED provider: Jesusita Oka PA New antibiotic prescription stop cephalexin, start bactrim DS one tab po bid x 3 days Called to [place] patient, 05/18/15 1410   Hazle Nordmann 05/18/2015, 2:14 PM

## 2015-05-21 NOTE — ED Provider Notes (Signed)
CSN: [id number]     Arrival date & time 05/14/15  0546 History   First MD Initiated Contact with Patient 05/14/15 0703     Chief Complaint  Patient presents with  . Abdominal Pain  . Diarrhea     (Consider location/radiation/quality/duration/timing/severity/associated sxs/prior Treatment) HPI   81 year old female with lower abdominal pain and diarrhea. Onset about a day ago. Pain is across lower abdomen. She describes as cramping. Sensation that she needs to have a bowel movement. No urinary complaints. No nausea vomiting. No blood or stool. Abdominal surgical history significant for appendectomy and abdominal hysterectomy.  Past Medical History  Diagnosis Date  . Takotsubo cardiomyopathy 2007    Normary coronaries 2007, initial EF 18% improved to 60%   . Uterine cancer ([place])   . Anxiety   . Bilateral cataracts   . Arthritis   . Diabetes mellitus without complication [facility])    Past Surgical History  Procedure Laterality Date  . Abdominal hysterectomy      1966  . Appendectomy    . Cataract extraction w/phaco  10/12/2011    Procedure: CATARACT EXTRACTION PHACO AND INTRAOCULAR LENS PLACEMENT (IOC);  Surgeon: Tonny Branch, MD;  Location: [facility] ORS;  Service: Ophthalmology;  Laterality: Left;  CDE:18.57  . Cataract extraction w/phaco  10/23/2011    Procedure: CATARACT EXTRACTION PHACO AND INTRAOCULAR LENS PLACEMENT (IOC);  Surgeon: Tonny Branch, MD;  Location: [facility] ORS;  Service: Ophthalmology;  Laterality: Right;  CDE 18.09  . Colonoscopy  01/31/2012    Procedure: COLONOSCOPY;  Surgeon: Daneil Dolin, MD;  Location: [facility] ENDO SUITE;  Service: Endoscopy;  Laterality: N/A;  1:45  . Left heart catheterization with coronary angiogram N/A 02/09/2012    Procedure: LEFT HEART CATHETERIZATION WITH CORONARY ANGIOGRAM;  Surgeon: Thayer Headings, MD;  Location: [facility] CATH LAB;  Service: Cardiovascular;  Laterality: N/A;   Family History  Problem Relation Age of Onset  . Cancer Mother     Ovarian  . Colon  cancer Neg Hx   . Anesthesia problems Neg Hx   . Hypotension Neg Hx   . Malignant hyperthermia Neg Hx   . Pseudochol deficiency Neg Hx   . Cancer Sister     Breast  . Heart attack Father    Social History  Substance Use Topics  . Smoking status: Never Smoker   . Smokeless tobacco: Never Used  . Alcohol Use: No   OB History    No data available     Review of Systems  All systems reviewed and negative, other than as noted in HPI.   Allergies  Review of patient's allergies indicates no known allergies.  Home Medications   Prior to Admission medications   Medication Sig Start Date End Date Taking? Authorizing Provider  aspirin EC 81 MG tablet Take 81 mg by mouth daily.   Yes Historical Provider, MD  benazepril (LOTENSIN) 5 MG tablet Take 2.5 mg by mouth daily.   Yes Historical Provider, MD  Flaxseed, Linseed, (FLAX SEED OIL) 1000 MG CAPS Take 1 capsule by mouth daily.   Yes Historical Provider, MD  meloxicam (MOBIC) 15 MG tablet Take 1 tablet (15 mg total) by mouth daily. 10/07/12  Yes Alycia Rossetti, MD  metFORMIN (GLUCOPHAGE) 500 MG tablet Take 500 mg by mouth at bedtime.   Yes Historical Provider, MD  nitroGLYCERIN (NITROSTAT) 0.4 MG SL tablet Place 1 tablet (0.4 mg total) under the tongue every 5 (five) minutes as needed. Up to 3 doses. If no relief  after 3rd dose, proceed to ED for evaluation 02/27/12  Yes Lendon Colonel, NP  simvastatin (ZOCOR) 5 MG tablet Take 5 mg by mouth daily.   Yes Historical Provider, MD  VESICARE 5 MG tablet TAKE ONE TABLET BY MOUTH ONCE DAILY 04/10/13  Yes Fayrene Helper, MD  cephALEXin (KEFLEX) 500 MG capsule Take 1 capsule (500 mg total) by mouth 3 (three) times daily. 05/14/15   Virgel Manifold, MD   BP 122/59 mmHg  Pulse 78  Temp(Src) 98.4 F (36.9 C) (Oral)  Resp 16  Ht 5\' 4"  (1.626 m)  Wt 125 lb (56.7 kg)  BMI 21.45 kg/m2  SpO2 98% Physical Exam  Constitutional: She appears well-developed and well-nourished. No distress.   HENT:  Head: Normocephalic and atraumatic.  Eyes: Conjunctivae are normal. Right eye exhibits no discharge. Left eye exhibits no discharge.  Neck: Neck supple.  Cardiovascular: Normal rate, regular rhythm and normal heart sounds.  Exam reveals no gallop and no friction rub.   No murmur heard. Pulmonary/Chest: Effort normal and breath sounds normal. No respiratory distress.  Abdominal: Soft. She exhibits no distension. There is tenderness.  Mild suprapubic tenderness without rebound or guarding. No distention  Genitourinary:  No CVA tenderness  Musculoskeletal: She exhibits no edema or tenderness.  Neurological: She is alert.  Skin: Skin is warm and dry.  Psychiatric: She has a normal mood and affect. Her behavior is normal. Thought content normal.  Nursing note and vitals reviewed.   ED Course  Procedures (including critical care time) Labs Review Labs Reviewed  COMPREHENSIVE METABOLIC PANEL - Abnormal; Notable for the following:    Glucose, Bld 112 (*)    BUN 21 (*)    ALT 11 (*)    All other components within normal limits  CBC WITH DIFFERENTIAL/PLATELET - Abnormal; Notable for the following:    Neutro Abs 8.3 (*)    All other components within normal limits  URINALYSIS, ROUTINE W REFLEX MICROSCOPIC (NOT AT [facility]) - Abnormal; Notable for the following:    Nitrite POSITIVE (*)    Leukocytes, UA SMALL (*)    All other components within normal limits  URINE MICROSCOPIC-ADD ON - Abnormal; Notable for the following:    Bacteria, UA MANY (*)    All other components within normal limits  URINE CULTURE    Imaging Review No results found. I have personally reviewed and evaluated these images and lab results as part of my medical decision-making.   EKG Interpretation None      MDM   Final diagnoses:  UTI (lower urinary tract infection)  Diarrhea, unspecified type   81 year old female with mild lower abdominal pain. Minimal tenderness on exam. Workup is consistent with  UTI. Patient is afebrile. Nontoxic. I feel she is appropriate for outpatient treatment. She is given a dose of Rocephin the emergency room prior to discharge. Continued cephalexin.It has been determined that no acute conditions requiring further emergency intervention are present at this time. The patient has been advised of the diagnosis and plan. I reviewed any labs and imaging including any potential incidental findings. We have discussed signs and symptoms that warrant return to the ED and they are listed in the discharge instructions.      Virgel Manifold, MD 05/21/15 [phone]

## 2015-09-03 DIAGNOSIS — I10 Essential (primary) hypertension: Secondary | ICD-10-CM | POA: Diagnosis not present

## 2015-09-03 DIAGNOSIS — E1165 Type 2 diabetes mellitus with hyperglycemia: Secondary | ICD-10-CM | POA: Diagnosis not present

## 2015-09-03 DIAGNOSIS — Z6820 Body mass index (BMI) 20.0-20.9, adult: Secondary | ICD-10-CM | POA: Diagnosis not present

## 2015-10-12 DIAGNOSIS — Z1231 Encounter for screening mammogram for malignant neoplasm of breast: Secondary | ICD-10-CM | POA: Diagnosis not present

## 2015-11-10 DIAGNOSIS — Z780 Asymptomatic menopausal state: Secondary | ICD-10-CM | POA: Diagnosis not present

## 2015-11-10 DIAGNOSIS — Z79899 Other long term (current) drug therapy: Secondary | ICD-10-CM | POA: Diagnosis not present

## 2015-11-10 DIAGNOSIS — M810 Age-related osteoporosis without current pathological fracture: Secondary | ICD-10-CM | POA: Diagnosis not present

## 2015-11-10 DIAGNOSIS — E7800 Pure hypercholesterolemia, unspecified: Secondary | ICD-10-CM | POA: Diagnosis not present

## 2015-11-10 DIAGNOSIS — Z7984 Long term (current) use of oral hypoglycemic drugs: Secondary | ICD-10-CM | POA: Diagnosis not present

## 2015-11-10 DIAGNOSIS — E119 Type 2 diabetes mellitus without complications: Secondary | ICD-10-CM | POA: Diagnosis not present

## 2015-11-10 DIAGNOSIS — M8589 Other specified disorders of bone density and structure, multiple sites: Secondary | ICD-10-CM | POA: Diagnosis not present

## 2015-12-03 DIAGNOSIS — I10 Essential (primary) hypertension: Secondary | ICD-10-CM | POA: Diagnosis not present

## 2015-12-03 DIAGNOSIS — E1165 Type 2 diabetes mellitus with hyperglycemia: Secondary | ICD-10-CM | POA: Diagnosis not present

## 2016-01-02 ENCOUNTER — Encounter (HOSPITAL_COMMUNITY)
Admission: EM | Disposition: A | Discharge status: Home / Self Care | Payer: Self-pay | Source: Home / Self Care | Attending: Cardiovascular Disease

## 2016-01-02 ENCOUNTER — Emergency Department (HOSPITAL_COMMUNITY): Payer: Medicare Other

## 2016-01-02 ENCOUNTER — Encounter (HOSPITAL_COMMUNITY): Payer: Self-pay

## 2016-01-02 ENCOUNTER — Inpatient Hospital Stay (HOSPITAL_COMMUNITY)
Admission: EM | Admit: 2016-01-02 | Discharge: 2016-01-04 | DRG: 316 | Disposition: A | Discharge status: Home / Self Care | Payer: Medicare Other | Attending: Cardiovascular Disease | Admitting: Cardiovascular Disease

## 2016-01-02 DIAGNOSIS — Z791 Long term (current) use of non-steroidal anti-inflammatories (NSAID): Secondary | ICD-10-CM

## 2016-01-02 DIAGNOSIS — I5181 Takotsubo syndrome: Secondary | ICD-10-CM | POA: Diagnosis present

## 2016-01-02 DIAGNOSIS — Z79899 Other long term (current) drug therapy: Secondary | ICD-10-CM | POA: Diagnosis not present

## 2016-01-02 DIAGNOSIS — Z8542 Personal history of malignant neoplasm of other parts of uterus: Secondary | ICD-10-CM

## 2016-01-02 DIAGNOSIS — I081 Rheumatic disorders of both mitral and tricuspid valves: Secondary | ICD-10-CM | POA: Diagnosis not present

## 2016-01-02 DIAGNOSIS — E785 Hyperlipidemia, unspecified: Secondary | ICD-10-CM | POA: Diagnosis present

## 2016-01-02 DIAGNOSIS — E119 Type 2 diabetes mellitus without complications: Secondary | ICD-10-CM | POA: Diagnosis present

## 2016-01-02 DIAGNOSIS — R634 Abnormal weight loss: Secondary | ICD-10-CM | POA: Diagnosis present

## 2016-01-02 DIAGNOSIS — Z7984 Long term (current) use of oral hypoglycemic drugs: Secondary | ICD-10-CM

## 2016-01-02 DIAGNOSIS — F329 Major depressive disorder, single episode, unspecified: Secondary | ICD-10-CM | POA: Diagnosis present

## 2016-01-02 DIAGNOSIS — R0789 Other chest pain: Secondary | ICD-10-CM | POA: Diagnosis not present

## 2016-01-02 DIAGNOSIS — R630 Anorexia: Secondary | ICD-10-CM | POA: Diagnosis present

## 2016-01-02 DIAGNOSIS — I10 Essential (primary) hypertension: Secondary | ICD-10-CM | POA: Diagnosis present

## 2016-01-02 DIAGNOSIS — Z9842 Cataract extraction status, left eye: Secondary | ICD-10-CM | POA: Diagnosis not present

## 2016-01-02 DIAGNOSIS — R079 Chest pain, unspecified: Secondary | ICD-10-CM | POA: Diagnosis not present

## 2016-01-02 DIAGNOSIS — F411 Generalized anxiety disorder: Secondary | ICD-10-CM | POA: Diagnosis present

## 2016-01-02 DIAGNOSIS — Z961 Presence of intraocular lens: Secondary | ICD-10-CM | POA: Diagnosis present

## 2016-01-02 DIAGNOSIS — I959 Hypotension, unspecified: Secondary | ICD-10-CM | POA: Diagnosis present

## 2016-01-02 DIAGNOSIS — F419 Anxiety disorder, unspecified: Secondary | ICD-10-CM | POA: Diagnosis present

## 2016-01-02 DIAGNOSIS — Z7982 Long term (current) use of aspirin: Secondary | ICD-10-CM | POA: Diagnosis not present

## 2016-01-02 DIAGNOSIS — Z9841 Cataract extraction status, right eye: Secondary | ICD-10-CM

## 2016-01-02 DIAGNOSIS — R7989 Other specified abnormal findings of blood chemistry: Secondary | ICD-10-CM | POA: Diagnosis not present

## 2016-01-02 DIAGNOSIS — R778 Other specified abnormalities of plasma proteins: Secondary | ICD-10-CM | POA: Diagnosis present

## 2016-01-02 HISTORY — DX: Essential (primary) hypertension: I10

## 2016-01-02 LAB — CBC WITH DIFFERENTIAL/PLATELET
BASOS ABS: 0 10*3/uL (ref 0.0–0.1)
BASOS PCT: 0 %
EOS ABS: 0.1 10*3/uL (ref 0.0–0.7)
EOS PCT: 1 %
HCT: 33.8 % — ABNORMAL LOW (ref 36.0–46.0)
HEMOGLOBIN: 10.9 g/dL — AB (ref 12.0–15.0)
Lymphocytes Relative: 10 %
Lymphs Abs: 0.9 10*3/uL (ref 0.7–4.0)
MCH: 28.7 pg (ref 26.0–34.0)
MCHC: 32.2 g/dL (ref 30.0–36.0)
MCV: 88.9 fL (ref 78.0–100.0)
Monocytes Absolute: 0.5 10*3/uL (ref 0.1–1.0)
Monocytes Relative: 5 %
NEUTROS PCT: 84 %
Neutro Abs: 7.5 10*3/uL (ref 1.7–7.7)
PLATELETS: 160 10*3/uL (ref 150–400)
RBC: 3.8 MIL/uL — AB (ref 3.87–5.11)
RDW: 12.6 % (ref 11.5–15.5)
WBC: 9 10*3/uL (ref 4.0–10.5)

## 2016-01-02 LAB — COMPREHENSIVE METABOLIC PANEL
ALT: 10 U/L — ABNORMAL LOW (ref 14–54)
ANION GAP: 5 (ref 5–15)
AST: 21 U/L (ref 15–41)
Albumin: 3.1 g/dL — ABNORMAL LOW (ref 3.5–5.0)
Alkaline Phosphatase: 47 U/L (ref 38–126)
BUN: 19 mg/dL (ref 6–20)
CHLORIDE: 111 mmol/L (ref 101–111)
CO2: 23 mmol/L (ref 22–32)
Calcium: 8.6 mg/dL — ABNORMAL LOW (ref 8.9–10.3)
Creatinine, Ser: 0.87 mg/dL (ref 0.44–1.00)
GFR calc non Af Amer: >60 mL/min (ref >60)
Glucose, Bld: 108 mg/dL — ABNORMAL HIGH (ref 65–99)
Potassium: 4.1 mmol/L (ref 3.5–5.1)
SODIUM: 139 mmol/L (ref 135–145)
Total Bilirubin: 0.6 mg/dL (ref 0.3–1.2)
Total Protein: 5.9 g/dL — ABNORMAL LOW (ref 6.5–8.1)

## 2016-01-02 LAB — CBG MONITORING, ED: GLUCOSE-CAPILLARY: 94 mg/dL (ref 65–99)

## 2016-01-02 LAB — BRAIN NATRIURETIC PEPTIDE: B Natriuretic Peptide: 78.5 pg/mL (ref 0.0–100.0)

## 2016-01-02 LAB — GLUCOSE, CAPILLARY
GLUCOSE-CAPILLARY: 142 mg/dL — AB (ref 65–99)
Glucose-Capillary: 108 mg/dL — ABNORMAL HIGH (ref 65–99)

## 2016-01-02 LAB — I-STAT TROPONIN, ED: TROPONIN I, POC: 0.62 ng/mL — AB (ref 0.00–0.08)

## 2016-01-02 LAB — PROTIME-INR
INR: 1.11 (ref 0.00–1.49)
PROTHROMBIN TIME: 14.5 s (ref 11.6–15.2)

## 2016-01-02 LAB — TROPONIN I
TROPONIN I: 0.51 ng/mL — AB (ref <0.03)
TROPONIN I: 1.98 ng/mL — AB (ref <0.03)
Troponin I: 1.37 ng/mL (ref <0.03)

## 2016-01-02 SURGERY — LEFT HEART CATH AND CORONARY ANGIOGRAPHY
Anesthesia: LOCAL

## 2016-01-02 MED ORDER — DARIFENACIN HYDROBROMIDE ER 7.5 MG PO TB24
7.5000 mg | ORAL_TABLET | Freq: Every day | ORAL | Status: DC
  Administered 2016-01-03 – 2016-01-04 (×2): 7.5 mg via ORAL
  Filled 2016-01-02 (×2): qty 1

## 2016-01-02 MED ORDER — CARVEDILOL 3.125 MG PO TABS
3.1250 mg | ORAL_TABLET | Freq: Two times a day (BID) | ORAL | Status: DC
  Filled 2016-01-02 (×2): qty 1

## 2016-01-02 MED ORDER — HEPARIN SODIUM (PORCINE) 5000 UNIT/ML IJ SOLN
5000.0000 [IU] | Freq: Three times a day (TID) | INTRAMUSCULAR | Status: DC
  Administered 2016-01-02 – 2016-01-04 (×4): 5000 [IU] via SUBCUTANEOUS
  Filled 2016-01-02 (×4): qty 1

## 2016-01-02 MED ORDER — ASPIRIN EC 81 MG PO TBEC
81.0000 mg | DELAYED_RELEASE_TABLET | Freq: Every day | ORAL | Status: DC
  Administered 2016-01-03 – 2016-01-04 (×2): 81 mg via ORAL
  Filled 2016-01-02 (×2): qty 1

## 2016-01-02 MED ORDER — INSULIN ASPART 100 UNIT/ML ~~LOC~~ SOLN
0.0000 [IU] | Freq: Three times a day (TID) | SUBCUTANEOUS | Status: DC
  Administered 2016-01-03: 1 [IU] via SUBCUTANEOUS

## 2016-01-02 MED ORDER — SODIUM CHLORIDE 0.9 % IV BOLUS (SEPSIS)
500.0000 mL | Freq: Once | INTRAVENOUS | Status: AC
  Administered 2016-01-02: 500 mL via INTRAVENOUS

## 2016-01-02 MED ORDER — NITROGLYCERIN 0.4 MG SL SUBL
0.4000 mg | SUBLINGUAL_TABLET | SUBLINGUAL | Status: DC | PRN
Start: 2016-01-02 — End: 2016-01-04

## 2016-01-02 MED ORDER — ONDANSETRON HCL 4 MG/2ML IJ SOLN
4.0000 mg | Freq: Four times a day (QID) | INTRAMUSCULAR | Status: DC | PRN
Start: 2016-01-02 — End: 2016-01-04

## 2016-01-02 MED ORDER — NITROGLYCERIN 0.4 MG SL SUBL
SUBLINGUAL_TABLET | SUBLINGUAL | Status: AC
  Filled 2016-01-02: qty 1

## 2016-01-02 MED ORDER — ACETAMINOPHEN 325 MG PO TABS
650.0000 mg | ORAL_TABLET | ORAL | Status: DC | PRN
  Administered 2016-01-02: 650 mg via ORAL
  Filled 2016-01-02: qty 2

## 2016-01-02 MED ORDER — SIMVASTATIN 10 MG PO TABS
5.0000 mg | ORAL_TABLET | Freq: Every day | ORAL | Status: DC
  Administered 2016-01-02 – 2016-01-03 (×2): 5 mg via ORAL
  Filled 2016-01-02 (×3): qty 1

## 2016-01-02 NOTE — ED Provider Notes (Signed)
[place] DEPT Provider Note   CSN: [id number] Arrival date & time: 01/02/16  1332  First Provider Contact:  None       History   Chief Complaint Chief Complaint  Patient presents with  . Chest Pain    HPI Debra Villarreal is a 82 y.o. female.  HPI    Patient is a 83 year old female presenting with chest pain. Patient was in church when she developed acute chest pacing radiating across her chest. According to patient she's had catheterizations done by Dr. Elza Rafter that have been negative in the past.   patient initially called as a code STEMI. Patient given 2 nitroglycerin prior to arrival which helped her symptoms. Patient having intermittent periods of "unresponsiveness". However patient is fluttering eyes at the time of unresponsiveness    Past Medical History:  Diagnosis Date  . Anxiety   . Arthritis   . Bilateral cataracts   . Diabetes mellitus without complication ([place])   . Hypertension   . Takotsubo cardiomyopathy 2007   Normary coronaries 2007, initial EF 18% improved to 60%   . Uterine cancer [facility])     Patient Active Problem List   Diagnosis Date Noted  . Takotsubo syndrome 02/10/2012  . Constipation 01/24/2012  . Pelvic pain in female 12/18/2011  . Lung nodule 12/18/2011  . LLQ abdominal pain 11/27/2011  . Hypertriglyceridemia 08/10/2011  . Anemia 06/29/2011  . Vitamin D deficiency 06/29/2011  . Stress-induced cardiomyopathy 06/29/2011  . Incontinence 06/29/2011  . Anxiety 06/29/2011  . Eczema 06/29/2011    Past Surgical History:  Procedure Laterality Date  . ABDOMINAL HYSTERECTOMY     1966  . APPENDECTOMY    . CATARACT EXTRACTION W/PHACO  10/12/2011   Procedure: CATARACT EXTRACTION PHACO AND INTRAOCULAR LENS PLACEMENT (IOC);  Surgeon: Tonny Branch, MD;  Location: [facility];  Service: Ophthalmology;  Laterality: Left;  CDE:18.57  . CATARACT EXTRACTION W/PHACO  10/23/2011   Procedure: CATARACT EXTRACTION PHACO AND INTRAOCULAR LENS PLACEMENT (IOC);   Surgeon: Tonny Branch, MD;  Location: [facility] ORS;  Service: Ophthalmology;  Laterality: Right;  CDE 18.09  . COLONOSCOPY  01/31/2012   Procedure: COLONOSCOPY;  Surgeon: Daneil Dolin, MD;  Location: [facility] ENDO [facility];  Service: Endoscopy;  Laterality: N/A;  1:45  . LEFT HEART CATHETERIZATION WITH CORONARY ANGIOGRAM N/A 02/09/2012   Procedure: LEFT HEART CATHETERIZATION WITH CORONARY ANGIOGRAM;  Surgeon: Thayer Headings, MD;  Location: [facility] CATH LAB;  Service: Cardiovascular;  Laterality: N/A;    OB History    No data available       Home Medications    Prior to Admission medications   Medication Sig Start Date End Date Taking? Authorizing Provider  aspirin EC 81 MG tablet Take 81 mg by mouth daily.    Historical Provider, MD  benazepril (LOTENSIN) 5 MG tablet Take 2.5 mg by mouth daily.    Historical Provider, MD  cephALEXin (KEFLEX) 500 MG capsule Take 1 capsule (500 mg total) by mouth 3 (three) times daily. 05/14/15   Virgel Manifold, MD  Flaxseed, Linseed, (FLAX SEED OIL) 1000 MG CAPS Take 1 capsule by mouth daily.    Historical Provider, MD  meloxicam (MOBIC) 15 MG tablet Take 1 tablet (15 mg total) by mouth daily. 10/07/12   Alycia Rossetti, MD  metFORMIN (GLUCOPHAGE) 500 MG tablet Take 500 mg by mouth at bedtime.    Historical Provider, MD  nitroGLYCERIN (NITROSTAT) 0.4 MG SL tablet Place 1 tablet (0.4 mg total) under the tongue every 5 (five)  minutes as needed. Up to 3 doses. If no relief after 3rd dose, proceed to ED for evaluation 02/27/12   Lendon Colonel, NP  simvastatin (ZOCOR) 5 MG tablet Take 5 mg by mouth daily.    Historical Provider, MD  VESICARE 5 MG tablet TAKE ONE TABLET BY MOUTH ONCE DAILY 04/10/13   Fayrene Helper, MD    Family History Family History  Problem Relation Age of Onset  . Cancer Mother     Ovarian  . Cancer Sister     Breast  . Heart attack Father   . Colon cancer Neg Hx   . Anesthesia problems Neg Hx   . Hypotension Neg Hx   . Malignant hyperthermia  Neg Hx   . Pseudochol deficiency Neg Hx     Social History Social History  Substance Use Topics  . Smoking status: Never Smoker  . Smokeless tobacco: Never Used  . Alcohol use No     Allergies   Review of patient's allergies indicates no known allergies.   Review of Systems Review of Systems  Eyes: Negative for pain and visual disturbance.  Respiratory: Positive for chest tightness. Negative for cough.   Cardiovascular: Positive for chest pain.  Gastrointestinal: Negative for abdominal pain and vomiting.  Musculoskeletal: Negative for arthralgias and back pain.  Skin: Negative for color change and rash.  Neurological: Positive for syncope and light-headedness.  Psychiatric/Behavioral: The patient is nervous/anxious.   All other systems reviewed and are negative.    Physical Exam Updated Vital Signs BP 123/69 (BP Location: Right Arm)   Pulse (!) 288   Temp 97.7 F (36.5 C) (Oral)   Resp (!) 0   Ht 5\' 3"  (1.6 m)   Wt 125 lb (56.7 kg)   SpO2 (!) 0%   BMI 22.14 kg/m   Physical Exam  Constitutional: She appears well-developed and well-nourished. No distress.  HENT:  Head: Normocephalic and atraumatic.  Eyes: Conjunctivae are normal.  Neck: Neck supple.  Cardiovascular: Normal rate and regular rhythm.   No murmur heard. Pulmonary/Chest: Effort normal and breath sounds normal. No respiratory distress.  Abdominal: Soft. There is no tenderness.  Musculoskeletal: She exhibits no edema.  Neurological: She is alert.  Skin: Skin is warm and dry.  Psychiatric: She has a normal mood and affect.  Nursing note and vitals reviewed.    ED Treatments / Results  Labs (all labs ordered are listed, but only abnormal results are displayed) Labs Reviewed  CBC WITH DIFFERENTIAL/PLATELET  COMPREHENSIVE METABOLIC PANEL  BRAIN NATRIURETIC PEPTIDE  TROPONIN I  PROTIME-INR  CBG MONITORING, ED  I-STAT TROPOININ, ED    EKG  EKG Interpretation  Date/Time:  Sunday January 02 2016 13:40:23 EDT Ventricular Rate:  94 PR Interval:    QRS Duration: 85 QT Interval:  352 QTC Calculation: 441 R Axis:   59 Text Interpretation:  Sinus rhythm Abnormal R-wave progression, early transition No significant change since last tracing Confirmed by Gerald Leitz ([id number]) on 01/02/2016 2:50:52 PM       Radiology Dg Chest Portable 1 View  Result Date: 01/02/2016 CLINICAL DATA:  Substernal chest pain EXAM: PORTABLE CHEST 1 VIEW COMPARISON:  August 14, 2014 FINDINGS: The heart size do borderline. The hila and mediastinum are normal. Subtle density projected over the upper left chest medially is most consistent with something on the patient. There is a skin fold laterally over the left chest. Minimal atelectasis in the right base. No other acute abnormalities. IMPRESSION: 1. Minimal opacity  in the medial right base thought to be atelectasis. No overt edema or other acute abnormalities. Electronically Signed   By: Dorise Bullion III M.D   On: 01/02/2016 14:20   Procedures Procedures (including critical care time)  Medications Ordered in ED Medications  nitroGLYCERIN (NITROSTAT) 0.4 MG SL tablet (not administered)     Initial Impression / Assessment and Plan / ED Course  I have reviewed the triage vital signs and the nursing notes.  Pertinent labs & imaging results that were available during my care of the patient were reviewed by me and considered in my medical decision making (see chart for details).  Clinical Course    Patient is a 82 year old female presenting with chest pain. Her EKG does not appear ischemic to me. However there is diffuse ST elevations that were present on prior. Patient's prior cath shows takusybos.  Dr. Burt Knack was at bedside and agrees is not a STEMI. However he will admit for serial troponins and monitoring.  CRITICAL CARE Performed by: Gardiner Sleeper Total critical care time: 30 minutes Critical care time was exclusive of separately  billable procedures and treating other patients. Critical care was necessary to treat or prevent imminent or life-threatening deterioration. Critical care was time spent personally by me on the following activities: development of treatment plan with patient and/or surrogate as well as nursing, discussions with consultants, evaluation of patient's response to treatment, examination of patient, obtaining history from patient or surrogate, ordering and performing treatments and interventions, ordering and review of laboratory studies, ordering and review of radiographic studies, pulse oximetry and re-evaluation of patient's condition.   Final Clinical Impressions(s) / ED Diagnoses   Final diagnoses:  None    New Prescriptions New Prescriptions   No medications on file     Sukhraj Esquivias Julio Alm, MD 01/02/16 1454

## 2016-01-02 NOTE — ED Notes (Signed)
Family at the bedside and they have been updated on plan of care

## 2016-01-02 NOTE — ED Notes (Addendum)
Reported to Dr. Harrington Challenger who called for Dr. Burt Knack, that pt.s Troponin is 0.62 and 0.51.  No orders received.

## 2016-01-02 NOTE — Progress Notes (Signed)
Pt has arrived to 2W from ED. Pt denies pain at this time. Telemetry box applied and CCMD notified. Pt's vitals stable. Pt oriented to room and is resting comfortably in bed. Call light within reach. Will continue to monitor.   Grant Fontana BSN, RN

## 2016-01-02 NOTE — Plan of Care (Signed)
Cardiology [place]  Patient with a hypotensive response to Carvedilol.  Also with poor PO intake all day.  There was inaccurate logging that she received 1500 cc in the ED, which we confirmed she did not.  We have stopped Coreg for now & given her a 500 cc bolus.  Frann Rider, MD

## 2016-01-02 NOTE — Progress Notes (Signed)
   01/02/16 1400  Clinical Encounter Type  Visited With Patient  Visit Type ED  Referral From Nurse  Consult/Referral To Chaplain  Spiritual Encounters  Spiritual Needs Emotional  Chp responded to STEMI. Contacted daughter and stayed with patient to provide support. Roe Coombs 01/02/16

## 2016-01-02 NOTE — H&P (Signed)
History and Physical  Patient ID: Debra Villarreal MRN: [id number], SOB: 09-25-1931 82 y.o. Date of Encounter: 01/02/2016, 2:34 PM  Primary Physician: Vic Blackbird, MD Primary Cardiologist: Domenic Polite  Chief Complaint: Chest Pain  HPI: 82 y.o. female w/ PMHx significant for Takotsubo cardiomyopathy who presented to [facility] on 01/02/2016 with complaints of chest pain.  The patient is brought in by [facility]. Initially she is paged out as a code STEMI.  The patient has a history of recurrent Takotsubo cardiomyopathy. She had similar presentations in 2007 and 2013, with normal heart catheterizations both times. She was found to have normal and smooth coronary arteries. She had LV dysfunction in a typical pattern of stress-induced cardiomyopathy. Her LVEF has been as low as 25%, but at the time of her last echocardiogram in 2014 her LVEF normalized at 55%.  The patient developed chest pain is morning. She's been off her a lot of stress. She's living in a trailer on her daughter's property and her daughter had a recent stroke. She's been looking for a new home. Her chest pain has felt similar to episodes in the past. There is no associated shortness of breath, diaphoresis, nausea, or vomiting. She admits that she has a lot of problems with anxiety and depression. At the time of my interview with her, she complains of mild ongoing chest discomfort. The pain is described as a squeezing sensation in the center of her chest. It is nonradiating. Other recent problems including weight loss and anorexia.   Past Medical History:  Diagnosis Date  . Anxiety   . Arthritis   . Bilateral cataracts   . Diabetes mellitus without complication ([place])   . Takotsubo cardiomyopathy 2007   Normary coronaries 2007, initial EF 18% improved to 60%   . Uterine cancer [facility])      Surgical History:  Past Surgical History:  Procedure Laterality Date  . ABDOMINAL HYSTERECTOMY     1966  .  APPENDECTOMY    . CATARACT EXTRACTION W/PHACO  10/12/2011   Procedure: CATARACT EXTRACTION PHACO AND INTRAOCULAR LENS PLACEMENT (IOC);  Surgeon: Tonny Branch, MD;  Location: [facility] ORS;  Service: Ophthalmology;  Laterality: Left;  CDE:18.57  . CATARACT EXTRACTION W/PHACO  10/23/2011   Procedure: CATARACT EXTRACTION PHACO AND INTRAOCULAR LENS PLACEMENT (IOC);  Surgeon: Tonny Branch, MD;  Location: [facility] ORS;  Service: Ophthalmology;  Laterality: Right;  CDE 18.09  . COLONOSCOPY  01/31/2012   Procedure: COLONOSCOPY;  Surgeon: Daneil Dolin, MD;  Location: [facility] ENDO SUITE;  Service: Endoscopy;  Laterality: N/A;  1:45  . LEFT HEART CATHETERIZATION WITH CORONARY ANGIOGRAM N/A 02/09/2012   Procedure: LEFT HEART CATHETERIZATION WITH CORONARY ANGIOGRAM;  Surgeon: Thayer Headings, MD;  Location: [facility] -  CATH LAB;  Service: Cardiovascular;  Laterality: N/A;     Home Meds: Prior to Admission medications   Medication Sig Start Date End Date Taking? Authorizing Provider  aspirin EC 81 MG tablet Take 81 mg by mouth daily.    Historical Provider, MD  benazepril (LOTENSIN) 5 MG tablet Take 2.5 mg by mouth daily.    Historical Provider, MD  cephALEXin (KEFLEX) 500 MG capsule Take 1 capsule (500 mg total) by mouth 3 (three) times daily. 05/14/15   Virgel Manifold, MD  Flaxseed, Linseed, (FLAX SEED OIL) 1000 MG CAPS Take 1 capsule by mouth daily.    Historical Provider, MD  meloxicam (MOBIC) 15 MG tablet Take 1 tablet (15 mg total) by mouth daily. 10/07/12   Modena Nunnery  [place], MD  metFORMIN (GLUCOPHAGE) 500 MG tablet Take 500 mg by mouth at bedtime.    Historical Provider, MD  nitroGLYCERIN (NITROSTAT) 0.4 MG SL tablet Place 1 tablet (0.4 mg total) under the tongue every 5 (five) minutes as needed. Up to 3 doses. If no relief after 3rd dose, proceed to ED for evaluation 02/27/12   Lendon Colonel, NP  simvastatin (ZOCOR) 5 MG tablet Take 5 mg by mouth daily.    Historical Provider, MD  VESICARE 5 MG tablet TAKE ONE TABLET BY MOUTH ONCE  DAILY 04/10/13   Fayrene Helper, MD    Allergies: No Known Allergies  Social History   Social History  . Marital status: Widowed    Spouse name: N/A  . Number of children: N/A  . Years of education: N/A   Occupational History  . Not on file.   Social History Main Topics  . Smoking status: Never Smoker  . Smokeless tobacco: Never Used  . Alcohol use No  . Drug use: No  . Sexual activity: No   Other Topics Concern  . Not on file   Social History Narrative  . No narrative on file     Family History  Problem Relation Age of Onset  . Cancer Mother     Ovarian  . Cancer Sister     Breast  . Heart attack Father   . Colon cancer Neg Hx   . Anesthesia problems Neg Hx   . Hypotension Neg Hx   . Malignant hyperthermia Neg Hx   . Pseudochol deficiency Neg Hx     Review of Systems: General: negative for chills, fever, night sweats or weight changes.  ENT: negative for rhinorrhea or epistaxis Cardiovascular: negative except as per HPI Dermatological: negative for rash Respiratory: negative for cough or wheezing GI: negative for nausea, vomiting, diarrhea, bright red blood per rectum, melena, or hematemesis. Positive for poor appetite GU: no hematuria, urgency, or frequency Neurologic: negative for visual changes, syncope, headache. Positive for dizziness especially with bending forward Heme: no easy bruising or bleeding Endo: negative for excessive thirst, thyroid disorder, or flushing Musculoskeletal: negative for joint pain or swelling, negative for myalgias All other systems reviewed and are otherwise negative except as noted above.  Physical Exam: Blood pressure 123/69, pulse (!) 288, temperature 97.7 F (36.5 C), temperature source Oral, resp. rate (!) 0, height 5\' 3"  (1.6 m), weight 56.7 kg (125 lb), SpO2 (!) 0 %.  On my examination the patient's pulse rate is 82 bpm, respiratory rate is 16 General: Thin elderly woman, alert and oriented, in no acute  distress. HEENT: Normocephalic, atraumatic, sclera anicteric Neck: Supple. Carotids 2+ without bruits. JVP normal Lungs: Clear bilaterally to auscultation without wheezes, rales, or rhonchi. Breathing is unlabored. Heart: RRR with normal S1 and S2. No murmurs, rubs, or gallops appreciated. Abdomen: Soft, non-tender, non-distended with normoactive bowel sounds. No hepatomegaly. No rebound/guarding. No obvious abdominal masses. Back: No CVA tenderness Msk:  Strength and tone appear normal for age. Extremities: No clubbing, cyanosis, or edema.  Distal pedal pulses are 2+ and equal bilaterally. Neuro: CNII-XII intact, moves all extremities spontaneously. Psych:  Responds to questions appropriately with a normal affect. Skin: warm and dry without rash   Labs:   Lab Results  Component Value Date   WBC 9.5 05/14/2015   HGB 12.4 05/14/2015   HCT 37.1 05/14/2015   MCV 87.7 05/14/2015   PLT 182 05/14/2015   No results for input(s): NA, K, CL,  CO2, BUN, CREATININE, CALCIUM, PROT, BILITOT, ALKPHOS, ALT, AST, GLUCOSE in the last 168 hours.  Invalid input(s): LABALBU No results for input(s): CKTOTAL, CKMB, TROPONINI in the last 72 hours. Lab Results  Component Value Date   CHOL 163 05/21/2012   HDL 46 05/21/2012   LDLCALC 96 05/21/2012   TRIG 103 05/21/2012   No results found for: DDIMER  Radiology/Studies:  Dg Chest Portable 1 View  Result Date: 01/02/2016 CLINICAL DATA:  Substernal chest pain EXAM: PORTABLE CHEST 1 VIEW COMPARISON:  August 14, 2014 FINDINGS: The heart size do borderline. The hila and mediastinum are normal. Subtle density projected over the upper left chest medially is most consistent with something on the patient. There is a skin fold laterally over the left chest. Minimal atelectasis in the right base. No other acute abnormalities. IMPRESSION: 1. Minimal opacity in the medial right base thought to be atelectasis. No overt edema or other acute abnormalities. Electronically  Signed   By: Dorise Bullion III M.D   On: 01/02/2016 14:20    EKG: Normal sinus rhythm with mild diffuse ST segment elevation, less than 1 mm, not diagnostic of STEMI  CARDIAC STUDIES: Cardiac catheterization 10-Feb-2012: Conclusions:   1. Smooth and normal coronaries.   2. Moderate - severe LV dysfunction -c/w Takatsubo Syndrome.  Continue medical therapy.  ASSESSMENT AND PLAN:  Chest pain syndrome: EKG and chest x-ray are reviewed. There is no evidence of pneumonia or other chest abnormality. EKG is nondiagnostic of STEMI. Considering this patient's history with normal coronary arteries on 2 prior cardiac catheterizations, STEMI is exceedingly unlikely. She continues to struggle with anxiety and I think the most likely etiology of her chest pain syndrome is acute Takotsubo syndrome. Will check an echocardiogram. We'll start carvedilol as tolerated. Will consult psychiatry to evaluate long-term treatment of her anxiety disorder.  Type 2 diabetes: We'll check a hemoglobin A1c, place on sliding scale insulin while here. Hold metformin while she is hospitalized.  Hyperlipidemia: Patient's treated with low dose simvastatin. Will continue  Hypertension: Blood pressure is borderline. Currently taking benazepril. Will hold this so that we can trial her on low-dose carvedilol.  Deatra James MD 01/02/2016, 2:34 PM

## 2016-01-02 NOTE — ED Triage Notes (Signed)
Pt. Was at church and began having Substernal non-radiating chest pain with intermittent sharp pains. Pt. Is pink, warm and dry.  Pt. Is alert and oriented X4.  Sinus Rythmn on the monitor

## 2016-01-03 ENCOUNTER — Inpatient Hospital Stay (HOSPITAL_COMMUNITY): Payer: Medicare Other

## 2016-01-03 DIAGNOSIS — R7989 Other specified abnormal findings of blood chemistry: Secondary | ICD-10-CM | POA: Diagnosis present

## 2016-01-03 DIAGNOSIS — I5181 Takotsubo syndrome: Principal | ICD-10-CM

## 2016-01-03 DIAGNOSIS — R079 Chest pain, unspecified: Secondary | ICD-10-CM

## 2016-01-03 DIAGNOSIS — R778 Other specified abnormalities of plasma proteins: Secondary | ICD-10-CM | POA: Diagnosis present

## 2016-01-03 LAB — ECHOCARDIOGRAM COMPLETE
Height: 64 in
Weight: 1840 oz

## 2016-01-03 LAB — GLUCOSE, CAPILLARY
GLUCOSE-CAPILLARY: 121 mg/dL — AB (ref 65–99)
Glucose-Capillary: 125 mg/dL — ABNORMAL HIGH (ref 65–99)
Glucose-Capillary: 110 mg/dL — ABNORMAL HIGH (ref 65–99)
Glucose-Capillary: 119 mg/dL — ABNORMAL HIGH (ref 65–99)

## 2016-01-03 LAB — LIPID PANEL
CHOL/HDL RATIO: 2.3 ratio
CHOLESTEROL: 112 mg/dL (ref 0–200)
HDL: 49 mg/dL (ref >40)
LDL Cholesterol: 49 mg/dL (ref 0–99)
TRIGLYCERIDES: 70 mg/dL (ref <150)
VLDL: 14 mg/dL (ref 0–40)

## 2016-01-03 LAB — TROPONIN I: TROPONIN I: 1.02 ng/mL — AB (ref <0.03)

## 2016-01-03 LAB — HEMOGLOBIN A1C
Hgb A1c MFr Bld: 5.4 % (ref 4.8–5.6)
MEAN PLASMA GLUCOSE: 108 mg/dL

## 2016-01-03 MED ORDER — CALCIUM CARBONATE ANTACID 500 MG PO CHEW
1.0000 | CHEWABLE_TABLET | Freq: Four times a day (QID) | ORAL | Status: DC | PRN
  Administered 2016-01-03: 200 mg via ORAL
  Filled 2016-01-03: qty 1

## 2016-01-03 MED ORDER — BUSPIRONE HCL 5 MG PO TABS
5.0000 mg | ORAL_TABLET | Freq: Two times a day (BID) | ORAL | Status: DC
  Administered 2016-01-03 – 2016-01-04 (×3): 5 mg via ORAL
  Filled 2016-01-03 (×4): qty 1

## 2016-01-03 MED ORDER — ESCITALOPRAM OXALATE 10 MG PO TABS
5.0000 mg | ORAL_TABLET | Freq: Every day | ORAL | Status: DC
  Administered 2016-01-03: 5 mg via ORAL
  Filled 2016-01-03: qty 1

## 2016-01-03 NOTE — Progress Notes (Signed)
DAILY PROGRESS NOTE  Subjective:  Hypotensive overnight. Carvedilol held. Given 500 cc fluid bolus. Troponin rise and fall consistent with NSTEMI - up to 1.98. She has had troponin elevations in the past without evidence for CAD by cath in 2007 and 2013 according to Dr. Burt Knack. Echo again shows LVEF of 25-30% (had normalized) with predominant inferior, inferolateral and apical hypokinesis. She denies chest pain and dyspnea today, lying flat -wants to go home.  Objective:  Temp:  [97.7 F (36.5 C)-98.4 F (36.9 C)] 98 F (36.7 C) (07/24 0512) Pulse Rate:  [0-288] 91 (07/24 0512) Resp:  [0-21] 18 (07/24 0512) BP: (77-127)/(52-106) 92/59 (07/24 0512) SpO2:  [0 %-100 %] 94 % (07/24 0512) Weight:  [115 lb (52.2 kg)-125 lb (56.7 kg)] 115 lb (52.2 kg) (07/23 1721) Weight change:   Intake/Output from previous day: 07/23 0701 - 07/24 0700 In: 2390 [P.O.:390; I.V.:2000] Out: 1000 [Urine:1000]  Intake/Output from this shift: No intake/output data recorded.  Medications: Current Facility-Administered Medications  Medication Dose Route Frequency Provider Last Rate Last Dose  . acetaminophen (TYLENOL) tablet 650 mg  650 mg Oral Q4H PRN Sherren Mocha, MD   650 mg at 01/02/16 2028  . aspirin EC tablet 81 mg  81 mg Oral Daily Sherren Mocha, MD   81 mg at 01/03/16 1036  . calcium carbonate (TUMS - dosed in mg elemental calcium) chewable tablet 200 mg of elemental calcium  1 tablet Oral Q6H PRN Alfonso Ramus, MD   200 mg of elemental calcium at 01/03/16 0028  . darifenacin (ENABLEX) 24 hr tablet 7.5 mg  7.5 mg Oral Daily Sherren Mocha, MD   7.5 mg at 01/03/16 1036  . heparin injection 5,000 Units  5,000 Units Subcutaneous Q8H Sherren Mocha, MD   5,000 Units at 01/03/16 0535  . insulin aspart (novoLOG) injection 0-9 Units  0-9 Units Subcutaneous TID WC Sherren Mocha, MD   1 Units at 01/03/16 [id number]  . nitroGLYCERIN (NITROSTAT) SL tablet 0.4 mg  0.4 mg Sublingual Q5 Min x 3 PRN Sherren Mocha, MD      . ondansetron [facility]) injection 4 mg  4 mg Intravenous Q6H PRN Sherren Mocha, MD      . simvastatin (ZOCOR) tablet 5 mg  5 mg Oral q1800 Sherren Mocha, MD   5 mg at 01/02/16 2015    Physical Exam: General appearance: alert and no distress Neck: no carotid bruit and no JVD Lungs: clear to auscultation bilaterally Heart: regular rate and rhythm, S1, S2 normal, no murmur, click, rub or gallop Extremities: extremities normal, atraumatic, no cyanosis or edema Neurologic: Grossly normal  Lab Results: Results for orders placed or performed during the hospital encounter of 01/02/16 (from the past 48 hour(s))  CBG monitoring, ED     Status: None   Collection Time: 01/02/16  1:39 PM  Result Value Ref Range   Glucose-Capillary 94 65 - 99 mg/dL  CBC with Differential/Platelet     Status: Abnormal   Collection Time: 01/02/16  3:05 PM  Result Value Ref Range   WBC 9.0 4.0 - 10.5 K/uL   RBC 3.80 (L) 3.87 - 5.11 MIL/uL   Hemoglobin 10.9 (L) 12.0 - 15.0 g/dL   HCT 33.8 (L) 36.0 - 46.0 %   MCV 88.9 78.0 - 100.0 fL   MCH 28.7 26.0 - 34.0 pg   MCHC 32.2 30.0 - 36.0 g/dL   RDW 12.6 11.5 - 15.5 %   Platelets 160 150 - 400 K/uL  Neutrophils Relative % 84 %   Neutro Abs 7.5 1.7 - 7.7 K/uL   Lymphocytes Relative 10 %   Lymphs Abs 0.9 0.7 - 4.0 K/uL   Monocytes Relative 5 %   Monocytes Absolute 0.5 0.1 - 1.0 K/uL   Eosinophils Relative 1 %   Eosinophils Absolute 0.1 0.0 - 0.7 K/uL   Basophils Relative 0 %   Basophils Absolute 0.0 0.0 - 0.1 K/uL  Comprehensive metabolic panel     Status: Abnormal   Collection Time: 01/02/16  3:05 PM  Result Value Ref Range   Sodium 139 135 - 145 mmol/L   Potassium 4.1 3.5 - 5.1 mmol/L   Chloride 111 101 - 111 mmol/L   CO2 23 22 - 32 mmol/L   Glucose, Bld 108 (H) 65 - 99 mg/dL   BUN 19 6 - 20 mg/dL   Creatinine, Ser 0.87 0.44 - 1.00 mg/dL   Calcium 8.6 (L) 8.9 - 10.3 mg/dL   Total Protein 5.9 (L) 6.5 - 8.1 g/dL   Albumin 3.1 (L) 3.5 - 5.0  g/dL   AST 21 15 - 41 U/L   ALT 10 (L) 14 - 54 U/L   Alkaline Phosphatase 47 38 - 126 U/L   Total Bilirubin 0.6 0.3 - 1.2 mg/dL   GFR calc non Af Amer >60 >60 mL/min   GFR calc Af Amer >60 >60 mL/min    Comment: (NOTE) The eGFR has been calculated using the CKD EPI equation. This calculation has not been validated in all clinical situations. eGFR's persistently <60 mL/min signify possible Chronic Kidney Disease.    Anion gap 5 5 - 15  Brain natriuretic peptide     Status: None   Collection Time: 01/02/16  3:05 PM  Result Value Ref Range   B Natriuretic Peptide 78.5 0.0 - 100.0 pg/mL  Troponin I     Status: Abnormal   Collection Time: 01/02/16  3:05 PM  Result Value Ref Range   Troponin I 0.51 (HH) <0.03 ng/mL    Comment: CRITICAL RESULT CALLED TO, READ BACK BY AND VERIFIED WITH: Aretta Nip [id number] 01/02/16 BY [place]   Protime-INR     Status: None   Collection Time: 01/02/16  3:05 PM  Result Value Ref Range   Prothrombin Time 14.5 11.6 - 15.2 seconds   INR 1.11 0.00 - 1.49  I-stat troponin, ED     Status: Abnormal   Collection Time: 01/02/16  3:13 PM  Result Value Ref Range   Troponin i, poc 0.62 (HH) 0.00 - 0.08 ng/mL   Comment NOTIFIED PHYSICIAN    Comment 3            Comment: Due to the release kinetics of cTnI, a negative result within the first hours of the onset of symptoms does not rule out myocardial infarction with certainty. If myocardial infarction is still suspected, repeat the test at appropriate intervals.   Glucose, capillary     Status: Abnormal   Collection Time: 01/02/16  5:50 PM  Result Value Ref Range   Glucose-Capillary 108 (H) 65 - 99 mg/dL   Comment 1 Notify RN    Comment 2 Document in Chart   Troponin I     Status: Abnormal   Collection Time: 01/02/16  5:57 PM  Result Value Ref Range   Troponin I 1.37 (HH) <0.03 ng/mL    Comment: CRITICAL VALUE NOTED.  VALUE IS CONSISTENT WITH PREVIOUSLY REPORTED AND CALLED VALUE.  Hemoglobin A1c  Status: None   Collection Time: 01/02/16  5:57 PM  Result Value Ref Range   Hgb A1c MFr Bld 5.4 4.8 - 5.6 %    Comment: (NOTE)         Pre-diabetes: 5.7 - 6.4         Diabetes: >6.4         Glycemic control for adults with diabetes: <7.0    Mean Plasma Glucose 108 mg/dL    Comment: (NOTE) Performed At: [facility] [address] [place], [place] [id number] Lindon Romp MD [id number]   Glucose, capillary     Status: Abnormal   Collection Time: 01/02/16  9:21 PM  Result Value Ref Range   Glucose-Capillary 142 (H) 65 - 99 mg/dL   Comment 1 Notify RN    Comment 2 Document in Chart   Troponin I     Status: Abnormal   Collection Time: 01/02/16 10:40 PM  Result Value Ref Range   Troponin I 1.98 (HH) <0.03 ng/mL    Comment: CRITICAL VALUE NOTED.  VALUE IS CONSISTENT WITH PREVIOUSLY REPORTED AND CALLED VALUE.  Troponin I     Status: Abnormal   Collection Time: 01/03/16  5:12 AM  Result Value Ref Range   Troponin I 1.02 (HH) <0.03 ng/mL    Comment: CRITICAL VALUE NOTED.  VALUE IS CONSISTENT WITH PREVIOUSLY REPORTED AND CALLED VALUE.  Lipid panel     Status: None   Collection Time: 01/03/16  5:12 AM  Result Value Ref Range   Cholesterol 112 0 - 200 mg/dL   Triglycerides 70 <150 mg/dL   HDL 49 >40 mg/dL   Total CHOL/HDL Ratio 2.3 RATIO   VLDL 14 0 - 40 mg/dL   LDL Cholesterol 49 0 - 99 mg/dL    Comment:        Total Cholesterol/HDL:CHD Risk Coronary Heart Disease Risk Table                     Men   Women  1/2 Average Risk   3.4   3.3  Average Risk       5.0   4.4  2 X Average Risk   9.6   7.1  3 X Average Risk  23.4   11.0        Use the calculated Patient Ratio above and the CHD Risk Table to determine the patient's CHD Risk.        ATP III CLASSIFICATION (LDL):  <100     mg/dL   Optimal  100-129  mg/dL   Near or Above                    Optimal  130-159  mg/dL   Borderline  160-189  mg/dL   High  >190     mg/dL   Very High   Glucose, capillary      Status: Abnormal   Collection Time: 01/03/16  5:40 AM  Result Value Ref Range   Glucose-Capillary 125 (H) 65 - 99 mg/dL   Comment 1 Notify RN     Imaging: Dg Chest Portable 1 View  Result Date: 01/02/2016 CLINICAL DATA:  Substernal chest pain EXAM: PORTABLE CHEST 1 VIEW COMPARISON:  August 14, 2014 FINDINGS: The heart size do borderline. The hila and mediastinum are normal. Subtle density projected over the upper left chest medially is most consistent with something on the patient. There is a skin fold laterally over the left chest. Minimal atelectasis in  the right base. No other acute abnormalities. IMPRESSION: 1. Minimal opacity in the medial right base thought to be atelectasis. No overt edema or other acute abnormalities. Electronically Signed   By: Dorise Bullion III M.D   On: 01/02/2016 14:20   Assessment:  Principal Problem:   Takotsubo syndrome Active Problems:   Anxiety   Elevated troponin   Plan:  1. She feels much better today. Troponin rose and fell - echo shows more focal inferior wall motion abnormality, however, Takatsubo CM is suspected - EF is again 25%. Hypotensive, which is limiting b-blocker at this time. Called psychiatry for consultation today regarding anxiety and situational anxiety re: treatment options as this seems to be driving her recurrent cardiomyopathy and admissions.  Time Spent Directly with Patient:  15 minutes  Length of Stay:  LOS: 1 day   Pixie Casino, MD, [facility] Attending Cardiologist [place] 01/03/2016, 10:49 AM

## 2016-01-03 NOTE — Progress Notes (Signed)
01/03/2016 2:01 AM Pt BP had dropped to as low as 82/52. Pt was given first dose of Coreg today. Rechecked BP was 77/53. Doctor Carmin Richmond was informed of the BP drop. 500 CC bolus was ordered and administered. Current BP is 82/52.  Physician has been informed of current BP. Will cont to monitor Pt.  Meryl Crutch RN

## 2016-01-03 NOTE — Progress Notes (Signed)
  Echocardiogram 2D Echocardiogram has been performed.  Debra Villarreal 01/03/2016, 10:05 AM

## 2016-01-03 NOTE — Consult Note (Signed)
[facility] Face-to-Face Psychiatry Consult   Reason for Consult:  Anxiety associated with recurrent cardiomyopathy. Referring Physician:  Dr. Burt Knack Patient Identification: Debra Villarreal MRN:  [id number] Principal Diagnosis: Takotsubo syndrome Diagnosis:   Patient Active Problem List   Diagnosis Date Noted  . Elevated troponin [R79.89] 01/03/2016  . Takotsubo syndrome [I51.81] 02/10/2012  . Constipation [K59.00] 01/24/2012  . Pelvic pain in female [R10.2] 12/18/2011  . Lung nodule [R91.1] 12/18/2011  . LLQ abdominal pain [R10.32] 11/27/2011  . Hypertriglyceridemia [E78.1] 08/10/2011  . Anemia [D64.9] 06/29/2011  . Vitamin D deficiency [268] 06/29/2011  . Stress-induced cardiomyopathy [I51.81] 06/29/2011  . Incontinence [R32] 06/29/2011  . Anxiety [F41.9] 06/29/2011  . Eczema [L30.9] 06/29/2011    Total Time spent with patient: 1 hour  Subjective:   Debra Villarreal is a 82 y.o. female patient admitted with Chest pain and panic attacks.  HPI:  Debra Villarreal is 82 years old female, living alone and has supportive daughters. Patient reportedly living in [place] and has plans of moving to [place], [place]. Patient is seen for face-to-face psychiatric consultation and evaluation of increased symptoms of generalized anxiety, panic Episodes. Patient reported recently she started having more and more panic episodes associated with shortness of breath, heavy chest, falling while standing up. Reportedly patient also lost significant weight and poor appetite and has no disturbance of sleep.  Patient also has multiple psychosocial stresses about living placement and does not want to live with family members and also ambivalent about giving up her freedom to live alone.Patient denied suicidal/homicidal ideation, intention or plans. Patient has no evidence of psychosis. Spoke with patient daughters who were at bedside was willing to get her assisted living facility as she is not able to  convince to move into their home. Patient reportedly still driving in the neighborhood. During my evaluation patient is concerned about her cardiac functioning and her inability to live alone and function without field of falling again. Patient is willing to resume medication management for depression, anxiety and panic episodes and also willing to relocate herself to the assisted living facility close to her daughter's home.   Past Psychiatric Historypatient has no previous history of acute psychiatric hospitalization or outpatient medication management for depression or anxiety.  Risk to Self:   Risk to Others:   Prior Inpatient Therapy:   Prior Outpatient Therapy:    Past Medical History:  Past Medical History:  Diagnosis Date  . Anxiety   . Arthritis   . Bilateral cataracts   . Diabetes mellitus without complication ([place])   . Hypertension   . Takotsubo cardiomyopathy 2007   Normary coronaries 2007, initial EF 18% improved to 60%   . Uterine cancer [facility])     Past Surgical History:  Procedure Laterality Date  . ABDOMINAL HYSTERECTOMY     1966  . APPENDECTOMY    . CATARACT EXTRACTION W/PHACO  10/12/2011   Procedure: CATARACT EXTRACTION PHACO AND INTRAOCULAR LENS PLACEMENT (IOC);  Surgeon: Tonny Branch, MD;  Location: [facility] ORS;  Service: Ophthalmology;  Laterality: Left;  CDE:18.57  . CATARACT EXTRACTION W/PHACO  10/23/2011   Procedure: CATARACT EXTRACTION PHACO AND INTRAOCULAR LENS PLACEMENT (IOC);  Surgeon: Tonny Branch, MD;  Location: [facility] ORS;  Service: Ophthalmology;  Laterality: Right;  CDE 18.09  . COLONOSCOPY  01/31/2012   Procedure: COLONOSCOPY;  Surgeon: Daneil Dolin, MD;  Location: [facility] ENDO SUITE;  Service: Endoscopy;  Laterality: N/A;  1:45  . LEFT HEART CATHETERIZATION WITH CORONARY ANGIOGRAM N/A 02/09/2012  Procedure: LEFT HEART CATHETERIZATION WITH CORONARY ANGIOGRAM;  Surgeon: Thayer Headings, MD;  Location: [facility] CATH LAB;  Service: Cardiovascular;  Laterality: N/A;   Family  History:  Family History  Problem Relation Age of Onset  . Cancer Mother     Ovarian  . Cancer Sister     Breast  . Heart attack Father   . Colon cancer Neg Hx   . Anesthesia problems Neg Hx   . Hypotension Neg Hx   . Malignant hyperthermia Neg Hx   . Pseudochol deficiency Neg Hx    Family Psychiatric  History: Patient has no known family history of mental illness  Social History:  History  Alcohol Use No     History  Drug Use No    Social History   Social History  . Marital status: Widowed    Spouse name: N/A  . Number of children: N/A  . Years of education: N/A   Social History Main Topics  . Smoking status: Never Smoker  . Smokeless tobacco: Never Used  . Alcohol use No  . Drug use: No  . Sexual activity: No   Other Topics Concern  . None   Social History Narrative  . None   Additional Social History:    Allergies:  No Known Allergies  Labs:  Results for orders placed or performed during the hospital encounter of 01/02/16 (from the past 48 hour(s))  CBG monitoring, ED     Status: None   Collection Time: 01/02/16  1:39 PM  Result Value Ref Range   Glucose-Capillary 94 65 - 99 mg/dL  CBC with Differential/Platelet     Status: Abnormal   Collection Time: 01/02/16  3:05 PM  Result Value Ref Range   WBC 9.0 4.0 - 10.5 K/uL   RBC 3.80 (L) 3.87 - 5.11 MIL/uL   Hemoglobin 10.9 (L) 12.0 - 15.0 g/dL   HCT 33.8 (L) 36.0 - 46.0 %   MCV 88.9 78.0 - 100.0 fL   MCH 28.7 26.0 - 34.0 pg   MCHC 32.2 30.0 - 36.0 g/dL   RDW 12.6 11.5 - 15.5 %   Platelets 160 150 - 400 K/uL   Neutrophils Relative % 84 %   Neutro Abs 7.5 1.7 - 7.7 K/uL   Lymphocytes Relative 10 %   Lymphs Abs 0.9 0.7 - 4.0 K/uL   Monocytes Relative 5 %   Monocytes Absolute 0.5 0.1 - 1.0 K/uL   Eosinophils Relative 1 %   Eosinophils Absolute 0.1 0.0 - 0.7 K/uL   Basophils Relative 0 %   Basophils Absolute 0.0 0.0 - 0.1 K/uL  Comprehensive metabolic panel     Status: Abnormal   Collection Time:  01/02/16  3:05 PM  Result Value Ref Range   Sodium 139 135 - 145 mmol/L   Potassium 4.1 3.5 - 5.1 mmol/L   Chloride 111 101 - 111 mmol/L   CO2 23 22 - 32 mmol/L   Glucose, Bld 108 (H) 65 - 99 mg/dL   BUN 19 6 - 20 mg/dL   Creatinine, Ser 0.87 0.44 - 1.00 mg/dL   Calcium 8.6 (L) 8.9 - 10.3 mg/dL   Total Protein 5.9 (L) 6.5 - 8.1 g/dL   Albumin 3.1 (L) 3.5 - 5.0 g/dL   AST 21 15 - 41 U/L   ALT 10 (L) 14 - 54 U/L   Alkaline Phosphatase 47 38 - 126 U/L   Total Bilirubin 0.6 0.3 - 1.2 mg/dL   GFR calc non Af  Amer >60 >60 mL/min   GFR calc Af Amer >60 >60 mL/min    Comment: (NOTE) The eGFR has been calculated using the CKD EPI equation. This calculation has not been validated in all clinical situations. eGFR's persistently <60 mL/min signify possible Chronic Kidney Disease.    Anion gap 5 5 - 15  Brain natriuretic peptide     Status: None   Collection Time: 01/02/16  3:05 PM  Result Value Ref Range   B Natriuretic Peptide 78.5 0.0 - 100.0 pg/mL  Troponin I     Status: Abnormal   Collection Time: 01/02/16  3:05 PM  Result Value Ref Range   Troponin I 0.51 (HH) <0.03 ng/mL    Comment: CRITICAL RESULT CALLED TO, READ BACK BY AND VERIFIED WITH: Aretta Nip [id number] 01/02/16 BY [place]   Protime-INR     Status: None   Collection Time: 01/02/16  3:05 PM  Result Value Ref Range   Prothrombin Time 14.5 11.6 - 15.2 seconds   INR 1.11 0.00 - 1.49  I-stat troponin, ED     Status: Abnormal   Collection Time: 01/02/16  3:13 PM  Result Value Ref Range   Troponin i, poc 0.62 (HH) 0.00 - 0.08 ng/mL   Comment NOTIFIED PHYSICIAN    Comment 3            Comment: Due to the release kinetics of cTnI, a negative result within the first hours of the onset of symptoms does not rule out myocardial infarction with certainty. If myocardial infarction is still suspected, repeat the test at appropriate intervals.   Glucose, capillary     Status: Abnormal   Collection Time: 01/02/16  5:50 PM  Result  Value Ref Range   Glucose-Capillary 108 (H) 65 - 99 mg/dL   Comment 1 Notify RN    Comment 2 Document in Chart   Troponin I     Status: Abnormal   Collection Time: 01/02/16  5:57 PM  Result Value Ref Range   Troponin I 1.37 (HH) <0.03 ng/mL    Comment: CRITICAL VALUE NOTED.  VALUE IS CONSISTENT WITH PREVIOUSLY REPORTED AND CALLED VALUE.  Hemoglobin A1c     Status: None   Collection Time: 01/02/16  5:57 PM  Result Value Ref Range   Hgb A1c MFr Bld 5.4 4.8 - 5.6 %    Comment: (NOTE)         Pre-diabetes: 5.7 - 6.4         Diabetes: >6.4         Glycemic control for adults with diabetes: <7.0    Mean Plasma Glucose 108 mg/dL    Comment: (NOTE) Performed At: [facility] [address] [place], [place] [id number] Lindon Romp MD [id number]   Glucose, capillary     Status: Abnormal   Collection Time: 01/02/16  9:21 PM  Result Value Ref Range   Glucose-Capillary 142 (H) 65 - 99 mg/dL   Comment 1 Notify RN    Comment 2 Document in Chart   Troponin I     Status: Abnormal   Collection Time: 01/02/16 10:40 PM  Result Value Ref Range   Troponin I 1.98 (HH) <0.03 ng/mL    Comment: CRITICAL VALUE NOTED.  VALUE IS CONSISTENT WITH PREVIOUSLY REPORTED AND CALLED VALUE.  Troponin I     Status: Abnormal   Collection Time: 01/03/16  5:12 AM  Result Value Ref Range   Troponin I 1.02 (HH) <0.03 ng/mL    Comment: CRITICAL VALUE  NOTED.  VALUE IS CONSISTENT WITH PREVIOUSLY REPORTED AND CALLED VALUE.  Lipid panel     Status: None   Collection Time: 01/03/16  5:12 AM  Result Value Ref Range   Cholesterol 112 0 - 200 mg/dL   Triglycerides 70 <150 mg/dL   HDL 49 >40 mg/dL   Total CHOL/HDL Ratio 2.3 RATIO   VLDL 14 0 - 40 mg/dL   LDL Cholesterol 49 0 - 99 mg/dL    Comment:        Total Cholesterol/HDL:CHD Risk Coronary Heart Disease Risk Table                     Men   Women  1/2 Average Risk   3.4   3.3  Average Risk       5.0   4.4  2 X Average Risk   9.6   7.1  3 X  Average Risk  23.4   11.0        Use the calculated Patient Ratio above and the CHD Risk Table to determine the patient's CHD Risk.        ATP III CLASSIFICATION (LDL):  <100     mg/dL   Optimal  100-129  mg/dL   Near or Above                    Optimal  130-159  mg/dL   Borderline  160-189  mg/dL   High  >190     mg/dL   Very High   Glucose, capillary     Status: Abnormal   Collection Time: 01/03/16  5:40 AM  Result Value Ref Range   Glucose-Capillary 125 (H) 65 - 99 mg/dL   Comment 1 Notify RN     Current Facility-Administered Medications  Medication Dose Route Frequency Provider Last Rate Last Dose  . acetaminophen (TYLENOL) tablet 650 mg  650 mg Oral Q4H PRN Sherren Mocha, MD   650 mg at 01/02/16 2028  . aspirin EC tablet 81 mg  81 mg Oral Daily Sherren Mocha, MD   81 mg at 01/03/16 1036  . calcium carbonate (TUMS - dosed in mg elemental calcium) chewable tablet 200 mg of elemental calcium  1 tablet Oral Q6H PRN Alfonso Ramus, MD   200 mg of elemental calcium at 01/03/16 0028  . darifenacin (ENABLEX) 24 hr tablet 7.5 mg  7.5 mg Oral Daily Sherren Mocha, MD   7.5 mg at 01/03/16 1036  . heparin injection 5,000 Units  5,000 Units Subcutaneous Q8H Sherren Mocha, MD   5,000 Units at 01/03/16 0535  . insulin aspart (novoLOG) injection 0-9 Units  0-9 Units Subcutaneous TID WC Sherren Mocha, MD   1 Units at 01/03/16 [id number]  . nitroGLYCERIN (NITROSTAT) SL tablet 0.4 mg  0.4 mg Sublingual Q5 Min x 3 PRN Sherren Mocha, MD      . ondansetron [facility]) injection 4 mg  4 mg Intravenous Q6H PRN Sherren Mocha, MD      . simvastatin (ZOCOR) tablet 5 mg  5 mg Oral q1800 Sherren Mocha, MD   5 mg at 01/02/16 2015    Musculoskeletal: Strength & Muscle Tone: decreased Gait & Station: unable to stand Patient leans: N/A  Psychiatric Specialty Exam: Physical Exam as per history and physical   ROS patient has been suffering with multiple stresses, anxiety, frequent falling down poor  appetite and loss of weight. Patient also endorses heaviness in her chest occasionally No Fever-chills, No Headache, No  changes with Vision or hearing, reports vertigo No problems swallowing food or Liquids, No Chest pain, Cough or Shortness of Breath, No Abdominal pain, No Nausea or Vommitting, Bowel movements are regular, No Blood in stool or Urine, No dysuria, No new skin rashes or bruises, No new joints pains-aches,  No new weakness, tingling, numbness in any extremity, No recent weight gain or loss, No polyuria, polydypsia or polyphagia,   A full 10 point Review of Systems was done, except as stated above, all other Review of Systems were negative.  Blood pressure (!) 92/59, pulse 91, temperature 98 F (36.7 C), temperature source Oral, resp. rate 18, height '5\' 4"'  (1.626 m), weight 52.2 kg (115 lb), SpO2 94 %.Body mass index is 19.74 kg/m.  General Appearance: Casual  Eye Contact:  Good  Speech:  Clear and Coherent  Volume:  Decreased  Mood:  Anxious and Depressed  Affect:  Appropriate and Congruent  Thought Process:  Coherent and Goal Directed  Orientation:  Full (Time, Place, and Person)  Thought Content:  WDL  Suicidal Thoughts:  No  Homicidal Thoughts:  No  Memory:  Immediate;   Fair Recent;   Fair Remote;   Fair  Judgement:  Fair  Insight:  Fair  Psychomotor Activity:  Decreased  Concentration:  Concentration: Good and Attention Span: Fair  Recall:  Good  Fund of Knowledge:  Good  Language:  Good  Akathisia:  Negative  Handed:  Right  AIMS (if indicated):     Assets:  Housing Intimacy Physical Health Talents/Skills Vocational/Educational  ADL's:  Intact  Cognition:  WNL  Sleep:        Treatment Plan Summary: Patient presented with increased symptoms of anxiety, shortness of breath and frequent falls and also has associated recurrent myocardiopathy. Patient also has multiple psychosocial stresses about living placement and does not want to live with  family members and also ambivalent about giving up her freedom to live alone. Patient has poor appetite and lost a lot of weight. Patient denied suicidal/homicidal ideation, intention or plans. Patient has no evidence of psychosis.  Generalized anxiety disorder: We start Lexapro 5 mg daily at bedtime which can be titrated up to 20 mg as clinically required We start BuSpar 5 mg twice daily for controlling generalized anxiety disorder  Safety concern: patient has no suicidal or homicidal ideation and at the same time she has more frequent falls and loss of consciousness about 15-30 minutes. Patient benefit staying with a the family members are at assisted living facility. Patient is willing to consider assisted living facility then family members during this discussion  Referred to the social service for providing referral to assisted living facility as patient cannot live herself and putting herself in risk of not able to access medical care.  Disposition:  No evidence of imminent risk to self or others at present.   Patient does not meet criteria for psychiatric inpatient admission. Supportive therapy provided about ongoing stressors.  Ambrose Finland, MD 01/03/2016 11:13 AM

## 2016-01-03 NOTE — Progress Notes (Signed)
01/03/2016 2:39 AM Pt troponin level has elevated from 0.51 to 1.98. Physician was notified. No new orders given. Will cont. to monitor PT Meryl Crutch, RN

## 2016-01-04 DIAGNOSIS — F419 Anxiety disorder, unspecified: Secondary | ICD-10-CM

## 2016-01-04 DIAGNOSIS — R7989 Other specified abnormal findings of blood chemistry: Secondary | ICD-10-CM

## 2016-01-04 LAB — GLUCOSE, CAPILLARY
GLUCOSE-CAPILLARY: 102 mg/dL — AB (ref 65–99)
Glucose-Capillary: 98 mg/dL (ref 65–99)

## 2016-01-04 MED ORDER — BUSPIRONE HCL 5 MG PO TABS: 5.0000 mg | ORAL_TABLET | Freq: Two times a day (BID) | ORAL | 11 refills | Status: DC

## 2016-01-04 MED ORDER — ESCITALOPRAM OXALATE 5 MG PO TABS: 5.0000 mg | ORAL_TABLET | Freq: Every day | ORAL | 11 refills | Status: DC

## 2016-01-04 NOTE — Progress Notes (Addendum)
DAILY PROGRESS NOTE  Subjective:   She denies chest pain and dyspnea today, lying flat -wants to go home.  Objective:  Temp:  [98 F (36.7 C)-98.9 F (37.2 C)] 98 F (36.7 C) (07/25 0451) Pulse Rate:  [82-93] 82 (07/25 0451) Resp:  [19] 19 (07/25 0451) BP: (97-105)/(51-55) 105/55 (07/25 0451) SpO2:  [95 %-96 %] 96 % (07/25 0451) Weight change:   Intake/Output from previous day: No intake/output data recorded.  Intake/Output from this shift: No intake/output data recorded.  Medications: Current Facility-Administered Medications  Medication Dose Route Frequency Provider Last Rate Last Dose  . acetaminophen (TYLENOL) tablet 650 mg  650 mg Oral Q4H PRN Sherren Mocha, MD   650 mg at 01/02/16 2028  . aspirin EC tablet 81 mg  81 mg Oral Daily Sherren Mocha, MD   81 mg at 01/03/16 1036  . busPIRone (BUSPAR) tablet 5 mg  5 mg Oral BID Ambrose Finland, MD   5 mg at 01/03/16 2118  . calcium carbonate (TUMS - dosed in mg elemental calcium) chewable tablet 200 mg of elemental calcium  1 tablet Oral Q6H PRN Alfonso Ramus, MD   200 mg of elemental calcium at 01/03/16 0028  . darifenacin (ENABLEX) 24 hr tablet 7.5 mg  7.5 mg Oral Daily Sherren Mocha, MD   7.5 mg at 01/03/16 1036  . escitalopram (LEXAPRO) tablet 5 mg  5 mg Oral QHS Ambrose Finland, MD   5 mg at 01/03/16 2118  . heparin injection 5,000 Units  5,000 Units Subcutaneous Q8H Sherren Mocha, MD   5,000 Units at 01/04/16 0514  . insulin aspart (novoLOG) injection 0-9 Units  0-9 Units Subcutaneous TID WC Sherren Mocha, MD   1 Units at 01/03/16 [phone]  . nitroGLYCERIN (NITROSTAT) SL tablet 0.4 mg  0.4 mg Sublingual Q5 Min x 3 PRN Sherren Mocha, MD      . ondansetron [facility]) injection 4 mg  4 mg Intravenous Q6H PRN Sherren Mocha, MD      . simvastatin (ZOCOR) tablet 5 mg  5 mg Oral q1800 Sherren Mocha, MD   5 mg at 01/03/16 1742    Physical Exam: General appearance: alert and no distress Neck: no carotid  bruit and no JVD Lungs: clear to auscultation bilaterally Heart: regular rate and rhythm, S1, S2 normal, no murmur, click, rub or gallop Extremities: extremities normal, atraumatic, no cyanosis or edema Neurologic: Grossly normal  Lab Results: Results for orders placed or performed during the hospital encounter of 01/02/16 (from the past 48 hour(s))  CBG monitoring, ED     Status: None   Collection Time: 01/02/16  1:39 PM  Result Value Ref Range   Glucose-Capillary 94 65 - 99 mg/dL  CBC with Differential/Platelet     Status: Abnormal   Collection Time: 01/02/16  3:05 PM  Result Value Ref Range   WBC 9.0 4.0 - 10.5 K/uL   RBC 3.80 (L) 3.87 - 5.11 MIL/uL   Hemoglobin 10.9 (L) 12.0 - 15.0 g/dL   HCT 33.8 (L) 36.0 - 46.0 %   MCV 88.9 78.0 - 100.0 fL   MCH 28.7 26.0 - 34.0 pg   MCHC 32.2 30.0 - 36.0 g/dL   RDW 12.6 11.5 - 15.5 %   Platelets 160 150 - 400 K/uL   Neutrophils Relative % 84 %   Neutro Abs 7.5 1.7 - 7.7 K/uL   Lymphocytes Relative 10 %   Lymphs Abs 0.9 0.7 - 4.0 K/uL   Monocytes Relative 5 %  Monocytes Absolute 0.5 0.1 - 1.0 K/uL   Eosinophils Relative 1 %   Eosinophils Absolute 0.1 0.0 - 0.7 K/uL   Basophils Relative 0 %   Basophils Absolute 0.0 0.0 - 0.1 K/uL  Comprehensive metabolic panel     Status: Abnormal   Collection Time: 01/02/16  3:05 PM  Result Value Ref Range   Sodium 139 135 - 145 mmol/L   Potassium 4.1 3.5 - 5.1 mmol/L   Chloride 111 101 - 111 mmol/L   CO2 23 22 - 32 mmol/L   Glucose, Bld 108 (H) 65 - 99 mg/dL   BUN 19 6 - 20 mg/dL   Creatinine, Ser 0.87 0.44 - 1.00 mg/dL   Calcium 8.6 (L) 8.9 - 10.3 mg/dL   Total Protein 5.9 (L) 6.5 - 8.1 g/dL   Albumin 3.1 (L) 3.5 - 5.0 g/dL   AST 21 15 - 41 U/L   ALT 10 (L) 14 - 54 U/L   Alkaline Phosphatase 47 38 - 126 U/L   Total Bilirubin 0.6 0.3 - 1.2 mg/dL   GFR calc non Af Amer >60 >60 mL/min   GFR calc Af Amer >60 >60 mL/min    Comment: (NOTE) The eGFR has been calculated using the CKD EPI  equation. This calculation has not been validated in all clinical situations. eGFR's persistently <60 mL/min signify possible Chronic Kidney Disease.    Anion gap 5 5 - 15  Brain natriuretic peptide     Status: None   Collection Time: 01/02/16  3:05 PM  Result Value Ref Range   B Natriuretic Peptide 78.5 0.0 - 100.0 pg/mL  Troponin I     Status: Abnormal   Collection Time: 01/02/16  3:05 PM  Result Value Ref Range   Troponin I 0.51 (HH) <0.03 ng/mL    Comment: CRITICAL RESULT CALLED TO, READ BACK BY AND VERIFIED WITH: Aretta Nip [id number] 01/02/16 BY [place]   Protime-INR     Status: None   Collection Time: 01/02/16  3:05 PM  Result Value Ref Range   Prothrombin Time 14.5 11.6 - 15.2 seconds   INR 1.11 0.00 - 1.49  I-stat troponin, ED     Status: Abnormal   Collection Time: 01/02/16  3:13 PM  Result Value Ref Range   Troponin i, poc 0.62 (HH) 0.00 - 0.08 ng/mL   Comment NOTIFIED PHYSICIAN    Comment 3            Comment: Due to the release kinetics of cTnI, a negative result within the first hours of the onset of symptoms does not rule out myocardial infarction with certainty. If myocardial infarction is still suspected, repeat the test at appropriate intervals.   Glucose, capillary     Status: Abnormal   Collection Time: 01/02/16  5:50 PM  Result Value Ref Range   Glucose-Capillary 108 (H) 65 - 99 mg/dL   Comment 1 Notify RN    Comment 2 Document in Chart   Troponin I     Status: Abnormal   Collection Time: 01/02/16  5:57 PM  Result Value Ref Range   Troponin I 1.37 (HH) <0.03 ng/mL    Comment: CRITICAL VALUE NOTED.  VALUE IS CONSISTENT WITH PREVIOUSLY REPORTED AND CALLED VALUE.  Hemoglobin A1c     Status: None   Collection Time: 01/02/16  5:57 PM  Result Value Ref Range   Hgb A1c MFr Bld 5.4 4.8 - 5.6 %    Comment: (NOTE)  Pre-diabetes: 5.7 - 6.4         Diabetes: >6.4         Glycemic control for adults with diabetes: <7.0    Mean Plasma Glucose 108 mg/dL     Comment: (NOTE) Performed At: [facility] [place], [place] [id number] Lindon Romp MD [id number]   Glucose, capillary     Status: Abnormal   Collection Time: 01/02/16  9:21 PM  Result Value Ref Range   Glucose-Capillary 142 (H) 65 - 99 mg/dL   Comment 1 Notify RN    Comment 2 Document in Chart   Troponin I     Status: Abnormal   Collection Time: 01/02/16 10:40 PM  Result Value Ref Range   Troponin I 1.98 (HH) <0.03 ng/mL    Comment: CRITICAL VALUE NOTED.  VALUE IS CONSISTENT WITH PREVIOUSLY REPORTED AND CALLED VALUE.  Troponin I     Status: Abnormal   Collection Time: 01/03/16  5:12 AM  Result Value Ref Range   Troponin I 1.02 (HH) <0.03 ng/mL    Comment: CRITICAL VALUE NOTED.  VALUE IS CONSISTENT WITH PREVIOUSLY REPORTED AND CALLED VALUE.  Lipid panel     Status: None   Collection Time: 01/03/16  5:12 AM  Result Value Ref Range   Cholesterol 112 0 - 200 mg/dL   Triglycerides 70 <150 mg/dL   HDL 49 >40 mg/dL   Total CHOL/HDL Ratio 2.3 RATIO   VLDL 14 0 - 40 mg/dL   LDL Cholesterol 49 0 - 99 mg/dL    Comment:        Total Cholesterol/HDL:CHD Risk Coronary Heart Disease Risk Table                     Men   Women  1/2 Average Risk   3.4   3.3  Average Risk       5.0   4.4  2 X Average Risk   9.6   7.1  3 X Average Risk  23.4   11.0        Use the calculated Patient Ratio above and the CHD Risk Table to determine the patient's CHD Risk.        ATP III CLASSIFICATION (LDL):  <100     mg/dL   Optimal  100-129  mg/dL   Near or Above                    Optimal  130-159  mg/dL   Borderline  160-189  mg/dL   High  >190     mg/dL   Very High   Glucose, capillary     Status: Abnormal   Collection Time: 01/03/16  5:40 AM  Result Value Ref Range   Glucose-Capillary 125 (H) 65 - 99 mg/dL   Comment 1 Notify RN   Glucose, capillary     Status: Abnormal   Collection Time: 01/03/16 11:13 AM  Result Value Ref Range   Glucose-Capillary 110 (H)  65 - 99 mg/dL  Glucose, capillary     Status: Abnormal   Collection Time: 01/03/16  4:40 PM  Result Value Ref Range   Glucose-Capillary 119 (H) 65 - 99 mg/dL  Glucose, capillary     Status: Abnormal   Collection Time: 01/03/16  8:51 PM  Result Value Ref Range   Glucose-Capillary 121 (H) 65 - 99 mg/dL  Glucose, capillary     Status: Abnormal   Collection Time: 01/04/16  6:34 AM  Result Value Ref Range   Glucose-Capillary 102 (H) 65 - 99 mg/dL    Imaging: Dg Chest Portable 1 View  Result Date: 01/02/2016 CLINICAL DATA:  Substernal chest pain EXAM: PORTABLE CHEST 1 VIEW COMPARISON:  August 14, 2014 FINDINGS: The heart size do borderline. The hila and mediastinum are normal. Subtle density projected over the upper left chest medially is most consistent with something on the patient. There is a skin fold laterally over the left chest. Minimal atelectasis in the right base. No other acute abnormalities. IMPRESSION: 1. Minimal opacity in the medial right base thought to be atelectasis. No overt edema or other acute abnormalities. Electronically Signed   By: Dorise Bullion III M.D   On: 01/02/2016 14:20  Echo: 01/03/16 Study Conclusions  - Left ventricle: inferior , inferior lateral septal and apical   hypokinesis The cavity size was moderately dilated. Wall   thickness was normal. Systolic function was severely reduced. The   estimated ejection fraction was in the range of 25% to 30%.   Doppler parameters are consistent with abnormal left ventricular   relaxation (grade 1 diastolic dysfunction). - Mitral valve: There was mild regurgitation. - Right atrium: The atrium was mildly dilated. - Atrial septum: No defect or patent foramen ovale was identified. - Tricuspid valve: There was moderate regurgitation. - Pulmonary arteries: PA peak pressure: 43 mm Hg (S).   Assessment:  Principal Problem:   Takotsubo syndrome Active Problems:   Anxiety   Elevated troponin   Plan:  1. She  feels much better today. Troponin rose and fell - echo shows more focal inferior wall motion abnormality, however, Takatsubo CM is suspected - EF is again 25%. Hypotensive, which is limiting b-blocker at this time. Should be OK for discharge, arrange for f/u in [place] with Dr Domenic Polite. Not on Coreg secondary to hypotension, consider addition of low dose ACE.  Time Spent Directly with Patient:  15 minutes  Length of Stay:  LOS: 2 days   Kerin Ransom 01/04/2016, 9:16 AM   Pt. Seen and examined. Agree with the NP/PA-C note as written. Appreciate psychiatry recs for Lexapro. Appears to be in compensated CHF. BP remains low for ACE-I or B-blocker. Re-address with early [facility] outpatient follow-up in [place] with Dr. Domenic Polite - would recommend her going back on carvedilol at that time. Ok to d/c home today.  Pixie Casino, MD, [facility] Attending Cardiologist [place]

## 2016-01-04 NOTE — Discharge Summary (Signed)
Patient ID: Debra Villarreal,  MRN: [id number], DOB/AGE: June 14, 1931 82 y.o.  Admit date: 01/02/2016 Discharge date: 01/04/2016  Primary Care Provider: Vic Blackbird, MD Primary Cardiologist: Dr Domenic Polite  Discharge Diagnoses Principal Problem:   Takotsubo syndrome Active Problems:   Anxiety   Elevated troponin    Procedures; Echo 01/03/16 Study Conclusions  - Left ventricle: inferior , inferior lateral septal and apical   hypokinesis The cavity size was moderately dilated. Wall   thickness was normal. Systolic function was severely reduced. The   estimated ejection fraction was in the range of 25% to 30%.   Doppler parameters are consistent with abnormal left ventricular   relaxation (grade 1 diastolic dysfunction). - Mitral valve: There was mild regurgitation. - Right atrium: The atrium was mildly dilated. - Atrial septum: No defect or patent foramen ovale was identified. - Tricuspid valve: There was moderate regurgitation. - Pulmonary arteries: PA peak pressure: 43 mm Hg (S).    Hospital Course:  82 y.o. female w/ PMHx significant for Takotsubo cardiomyopathy who presented to [facility] on 01/02/2016 with complaints of chest pain.  The patient was brought in by [facility], initially she was paged out as a code STEMI.  The patient has a history of recurrent Takotsubo cardiomyopathy. She had similar presentations in 2007 and 2013, with normal heart catheterizations both times. She was found to have normal and smooth coronary arteries. She had LV dysfunction in a typical pattern of stress-induced cardiomyopathy. Her LVEF has been as low as 25%, but at the time of her last echocardiogram in 2014 her LVEF normalized at 55%. The pt was admitted and ruled in for a NSTEMI- Troponin peak 1.98. Echo done showed EF 25% with inferior, apical, and lateral HK. Coreg was added but she could not tolerate this secondary to hypotension. Takatsubo CM again suspected. The pt  has high level of anxiety that we felt was contributing to this. She was seen by psychiatry and started on medications. Dr Debara Pickett felt the pt could be discharged 01/04/16. He recommends resuming Coreg as an OP if her B/P tolerates.    Discharge Vitals:  Blood pressure (!) 105/55, pulse 82, temperature 98 F (36.7 C), temperature source Oral, resp. rate 19, height 5\' 4"  (1.626 m), weight 115 lb (52.2 kg), SpO2 96 %.    Labs: Results for orders placed or performed during the hospital encounter of 01/02/16 (from the past 24 hour(s))  Glucose, capillary     Status: Abnormal   Collection Time: 01/03/16  4:40 PM  Result Value Ref Range   Glucose-Capillary 119 (H) 65 - 99 mg/dL  Glucose, capillary     Status: Abnormal   Collection Time: 01/03/16  8:51 PM  Result Value Ref Range   Glucose-Capillary 121 (H) 65 - 99 mg/dL  Glucose, capillary     Status: Abnormal   Collection Time: 01/04/16  6:34 AM  Result Value Ref Range   Glucose-Capillary 102 (H) 65 - 99 mg/dL  Glucose, capillary     Status: None   Collection Time: 01/04/16 11:35 AM  Result Value Ref Range   Glucose-Capillary 98 65 - 99 mg/dL    Disposition:  Follow-up Information    Rozann Lesches, MD .   Specialty:  Cardiology Why:  office will contact you Contact information: [place] [address] [phone]           Discharge Medications:    Medication List    STOP taking these medications  ALEVE 220 MG tablet Generic drug:  naproxen sodium   benazepril 5 MG tablet Commonly known as:  LOTENSIN     TAKE these medications   acetaminophen 325 MG tablet Commonly known as:  TYLENOL Take 325 mg by mouth every 6 (six) hours as needed for mild pain or moderate pain.   aspirin EC 81 MG tablet Take 81 mg by mouth daily.   busPIRone 5 MG tablet Commonly known as:  BUSPAR Take 1 tablet (5 mg total) by mouth 2 (two) times daily.   CALCIUM + D PO Take 1 tablet by mouth 2 (two) times daily.     escitalopram 5 MG tablet Commonly known as:  LEXAPRO Take 1 tablet (5 mg total) by mouth at bedtime.   FISH OIL PO Take 1 capsule by mouth daily with breakfast.   Flax Seed Oil 1000 MG Caps Take 1 capsule by mouth daily.   meloxicam 15 MG tablet Commonly known as:  MOBIC Take 1 tablet (15 mg total) by mouth daily.   metFORMIN 500 MG tablet Commonly known as:  GLUCOPHAGE Take 500 mg by mouth at bedtime.   nitroGLYCERIN 0.4 MG SL tablet Commonly known as:  NITROSTAT Place 1 tablet (0.4 mg total) under the tongue every 5 (five) minutes as needed. Up to 3 doses. If no relief after 3rd dose, proceed to ED for evaluation   simvastatin 5 MG tablet Commonly known as:  ZOCOR Take 5 mg by mouth daily.   VESICARE 5 MG tablet Generic drug:  solifenacin TAKE ONE TABLET BY MOUTH ONCE DAILY        Duration of Discharge Encounter: Greater than 30 minutes including physician time.  Angelena Form PA-C 01/04/2016 1:58 PM

## 2016-01-20 ENCOUNTER — Encounter: Payer: Self-pay | Admitting: Cardiology

## 2016-01-24 ENCOUNTER — Telehealth: Payer: Self-pay | Admitting: Cardiology

## 2016-01-24 NOTE — Telephone Encounter (Signed)
Forward to [employer], [place].A.

## 2016-01-24 NOTE — Telephone Encounter (Signed)
OK to drive if she feels up to it.  Kerin Ransom PA-C 01/24/2016 1:56 PM

## 2016-01-24 NOTE — Telephone Encounter (Signed)
Patient wants to know if she is supposed to be driving post hospiatl. / tg

## 2016-01-24 NOTE — Telephone Encounter (Signed)
Returned pt call, her daughter stated she would make sure to let pt know she could drive if she feels like it.

## 2016-02-22 ENCOUNTER — Encounter: Payer: Self-pay | Admitting: Cardiology

## 2016-03-06 DIAGNOSIS — I10 Essential (primary) hypertension: Secondary | ICD-10-CM | POA: Diagnosis not present

## 2016-03-06 DIAGNOSIS — Z0000 Encounter for general adult medical examination without abnormal findings: Secondary | ICD-10-CM | POA: Diagnosis not present

## 2016-03-06 DIAGNOSIS — Z1389 Encounter for screening for other disorder: Secondary | ICD-10-CM | POA: Diagnosis not present

## 2016-03-06 DIAGNOSIS — E1165 Type 2 diabetes mellitus with hyperglycemia: Secondary | ICD-10-CM | POA: Diagnosis not present

## 2016-03-06 DIAGNOSIS — M170 Bilateral primary osteoarthritis of knee: Secondary | ICD-10-CM | POA: Diagnosis not present

## 2016-03-06 DIAGNOSIS — E784 Other hyperlipidemia: Secondary | ICD-10-CM | POA: Diagnosis not present

## 2016-03-17 ENCOUNTER — Ambulatory Visit (INDEPENDENT_AMBULATORY_CARE_PROVIDER_SITE_OTHER): Payer: Medicare Other | Admitting: Adult Health

## 2016-03-17 ENCOUNTER — Encounter: Payer: Self-pay | Admitting: Adult Health

## 2016-03-17 VITALS — BP 126/78 | HR 92 | Resp 97 | Ht 65.0 in | Wt 111.0 lb

## 2016-03-17 DIAGNOSIS — Z23 Encounter for immunization: Secondary | ICD-10-CM

## 2016-03-17 DIAGNOSIS — I422 Other hypertrophic cardiomyopathy: Secondary | ICD-10-CM | POA: Diagnosis not present

## 2016-03-17 NOTE — Patient Instructions (Signed)
Your physician wants you to follow-up in: 6 months You will receive a reminder letter in the mail two months in advance. If you don't receive a letter, please call our office to schedule the follow-up appointment.     Your physician recommends that you continue on your current medications as directed. Please refer to the Current Medication list given to you today.      Thank you for choosing [facility] !        

## 2016-03-17 NOTE — Progress Notes (Signed)
Cardiology Office Note   Date:  03/17/2016   ID:  Debra Villarreal, DOB 1932-01-11, MRN [id number]  PCP:  Neale Burly, MD  Cardiologist: [place]/  Jory Sims, NP   No chief complaint on file.     History of Present Illness: Debra Villarreal is a 82 y.o. female who presents for ongoing assessment and management of Takotsubo CM, chest pain, with recent hospitalization in July 2017 with elevated troponin. The patient also has high-level anxiety. During that hospitalization she was seen by psychiatry and started on medications.  Most recent echocardiogram on 01/03/2016 revealed Left ventricle: inferior , inferior lateral septal and apical hypokinesis The cavity size was moderately dilated. Wall thickness was normal. Systolic function was severely reduced. The estimated ejection fraction was in the range of 25% to 30%. Doppler parameters are consistent with abnormal left ventricular relaxation (grade 1 diastolic dysfunction). - Mitral valve: There was mild regurgitation. - Right atrium: The atrium was mildly dilated. - Atrial septum: No defect or patent foramen ovale was identified. - Tricuspid valve: There was moderate regurgitation. - Pulmonary arteries: PA peak pressure: 43 mm Hg (S).  Coreg was added but she did not tolerate due to hypotension. She is currently not on any antihypertensives or beta blockers. She is doing very well, she denies chest pain dizziness shortness of breath. She has moved to a smaller home where she is quite happy and his enjoyed redecorating. She is completely without complaints.  Past Medical History:  Diagnosis Date  . Anxiety   . Arthritis   . Bilateral cataracts   . Diabetes mellitus without complication ([place])   . Hypertension   . Takotsubo cardiomyopathy 2007   Normary coronaries 2007, initial EF 18% improved to 60%   . Uterine cancer [facility])     Past Surgical History:  Procedure Laterality Date  . ABDOMINAL HYSTERECTOMY     1966  . APPENDECTOMY    . CATARACT EXTRACTION W/PHACO  10/12/2011   Procedure: CATARACT EXTRACTION PHACO AND INTRAOCULAR LENS PLACEMENT (IOC);  Surgeon: Tonny Branch, MD;  Location: [facility] ORS;  Service: Ophthalmology;  Laterality: Left;  CDE:18.57  . CATARACT EXTRACTION W/PHACO  10/23/2011   Procedure: CATARACT EXTRACTION PHACO AND INTRAOCULAR LENS PLACEMENT (IOC);  Surgeon: Tonny Branch, MD;  Location: [facility] ORS;  Service: Ophthalmology;  Laterality: Right;  CDE 18.09  . COLONOSCOPY  01/31/2012   Procedure: COLONOSCOPY;  Surgeon: Daneil Dolin, MD;  Location: [facility] ENDO SUITE;  Service: Endoscopy;  Laterality: N/A;  1:45  . LEFT HEART CATHETERIZATION WITH CORONARY ANGIOGRAM N/A 02/09/2012   Procedure: LEFT HEART CATHETERIZATION WITH CORONARY ANGIOGRAM;  Surgeon: Thayer Headings, MD;  Location: [facility] C[facility] LAB;  Service: Cardiovascular;  Laterality: N/A;     Current Outpatient Prescriptions  Medication Sig Dispense Refill  . acetaminophen (TYLENOL) 325 MG tablet Take 325 mg by mouth every 6 (six) hours as needed for mild pain or moderate pain.     Marland Kitchen aspirin EC 81 MG tablet Take 81 mg by mouth daily.    . busPIRone (BUSPAR) 5 MG tablet Take 1 tablet (5 mg total) by mouth 2 (two) times daily. 60 tablet 11  . Calcium Citrate-Vitamin D (CALCIUM + D PO) Take 1 tablet by mouth 2 (two) times daily.    Marland Kitchen escitalopram (LEXAPRO) 5 MG tablet Take 1 tablet (5 mg total) by mouth at bedtime. 30 tablet 11  . Flaxseed, Linseed, (FLAX SEED OIL) 1000 MG CAPS Take 1 capsule by mouth daily.    Marland Kitchen  meloxicam (MOBIC) 15 MG tablet Take 1 tablet (15 mg total) by mouth daily. 30 tablet 4  . metFORMIN (GLUCOPHAGE) 500 MG tablet Take 500 mg by mouth at bedtime.    . nitroGLYCERIN (NITROSTAT) 0.4 MG SL tablet Place 1 tablet (0.4 mg total) under the tongue every 5 (five) minutes as needed. Up to 3 doses. If no relief after 3rd dose, proceed to ED for evaluation 25 tablet 2  . Omega-3 Fatty Acids (FISH OIL PO) Take 1 capsule by mouth daily with  breakfast.    . simvastatin (ZOCOR) 5 MG tablet Take 5 mg by mouth daily.    . VESICARE 5 MG tablet TAKE ONE TABLET BY MOUTH ONCE DAILY 90 tablet 0   No current facility-administered medications for this visit.     Allergies:   Review of patient's allergies indicates no known allergies.    Social History:  The patient  reports that she has never smoked. She has never used smokeless tobacco. She reports that she does not drink alcohol or use drugs.   Family History:  The patient's family history includes Cancer in her mother and sister; Heart attack in her father.    ROS: All other systems are reviewed and negative. Unless otherwise mentioned in H&P    PHYSICAL EXAM: VS:  BP 126/78   Pulse 92   Resp (!) 97   Ht 5\' 5"  (1.651 m)   Wt 111 lb (50.3 kg)   BMI 18.47 kg/m  , BMI Body mass index is 18.47 kg/m. GEN: Well nourished, well developed, in no acute distress  HEENT: normal  Neck: no JVD, carotid bruits, or masses Cardiac: RRR; no murmurs, rubs, or gallops,no edema  Respiratory:  clear to auscultation bilaterally, normal work of breathing GI: soft, nontender, nondistended, + BS MS: no deformity or atrophy  Skin: warm and dry, no rash Neuro:  Strength and sensation are intact Psych: euthymic mood, full affect   Recent Labs: 01/02/2016: ALT 10; B Natriuretic Peptide 78.5; BUN 19; Creatinine, Ser 0.87; Hemoglobin 10.9; Platelets 160; Potassium 4.1; Sodium 139    Lipid Panel    Component Value Date/Time   CHOL 112 01/03/2016 0512   TRIG 70 01/03/2016 0512   HDL 49 01/03/2016 0512   CHOLHDL 2.3 01/03/2016 0512   VLDL 14 01/03/2016 0512   LDLCALC 49 01/03/2016 0512      Wt Readings from Last 3 Encounters:  03/17/16 111 lb (50.3 kg)  01/02/16 115 lb (52.2 kg)  05/14/15 125 lb (56.7 kg)    ASSESSMENT AND PLAN:  1. Takutsubo cardiomyopathy:  The patient is completely asymptomatic. Tolerating her medications without difficulty. She is compliant. Will not make any  changes in her medication at this time.   2. Hypertriglyceridemia:patient will continue on simvastatin as directed. She will follow up with primary care for ongoing labs. We'll see her again in 6 months  Current medicines are reviewed at length with the patient today.    Labs/ tests ordered today include: None  Orders Placed This Encounter  Procedures  . Flu Vaccine QUAD 36+ mos IM     Disposition:   FU with 6 months  Signed, Jory Sims, NP  03/17/2016 2:25 PM    [place] [address], [place], [place] [address] Phone: [phone]; Fax: [phone]

## 2016-03-17 NOTE — Progress Notes (Signed)
Name: Debra Villarreal    DOB: 08-18-1931  Age: 82 y.o.  MR#: [id number]       PCP:  Neale Burly, MD      Insurance: Payor: [employer] MEDICARE / Plan: [facility] MEDICARE / Product Type: *No Product type* /   CC:   No chief complaint on file.   VS There were no vitals filed for this visit.  Weights Current Weight  01/02/16 115 lb (52.2 kg)  05/14/15 125 lb (56.7 kg)  01/17/13 130 lb (59 kg)    Blood Pressure  BP Readings from Last 3 Encounters:  01/04/16 (!) 105/55  05/14/15 122/59  01/17/13 134/66     Admit date:  (Not on file) Last encounter with RMR:  Visit date not found   Allergy Review of patient's allergies indicates no known allergies.  Current Outpatient Prescriptions  Medication Sig Dispense Refill  . acetaminophen (TYLENOL) 325 MG tablet Take 325 mg by mouth every 6 (six) hours as needed for mild pain or moderate pain.     Marland Kitchen aspirin EC 81 MG tablet Take 81 mg by mouth daily.    . busPIRone (BUSPAR) 5 MG tablet Take 1 tablet (5 mg total) by mouth 2 (two) times daily. 60 tablet 11  . Calcium Citrate-Vitamin D (CALCIUM + D PO) Take 1 tablet by mouth 2 (two) times daily.    Marland Kitchen escitalopram (LEXAPRO) 5 MG tablet Take 1 tablet (5 mg total) by mouth at bedtime. 30 tablet 11  . Flaxseed, Linseed, (FLAX SEED OIL) 1000 MG CAPS Take 1 capsule by mouth daily.    . meloxicam (MOBIC) 15 MG tablet Take 1 tablet (15 mg total) by mouth daily. 30 tablet 4  . metFORMIN (GLUCOPHAGE) 500 MG tablet Take 500 mg by mouth at bedtime.    . nitroGLYCERIN (NITROSTAT) 0.4 MG SL tablet Place 1 tablet (0.4 mg total) under the tongue every 5 (five) minutes as needed. Up to 3 doses. If no relief after 3rd dose, proceed to ED for evaluation 25 tablet 2  . Omega-3 Fatty Acids (FISH OIL PO) Take 1 capsule by mouth daily with breakfast.    . simvastatin (ZOCOR) 5 MG tablet Take 5 mg by mouth daily.    . VESICARE 5 MG tablet TAKE ONE TABLET BY MOUTH ONCE DAILY 90 tablet 0   No current  facility-administered medications for this visit.     Discontinued Meds:   There are no discontinued medications.  Patient Active Problem List   Diagnosis Date Noted  . Elevated troponin 01/03/2016  . Takotsubo syndrome 02/10/2012  . Constipation 01/24/2012  . Pelvic pain in female 12/18/2011  . Lung nodule 12/18/2011  . LLQ abdominal pain 11/27/2011  . Hypertriglyceridemia 08/10/2011  . Anemia 06/29/2011  . Vitamin D deficiency 06/29/2011  . Stress-induced cardiomyopathy 06/29/2011  . Incontinence 06/29/2011  . Anxiety 06/29/2011  . Eczema 06/29/2011    LABS    Component Value Date/Time   NA 139 01/02/2016 1505   NA 138 05/14/2015 0655   NA 138 05/21/2012 1014   K 4.1 01/02/2016 1505   K 3.9 05/14/2015 0655   K 4.2 05/21/2012 1014   CL 111 01/02/2016 1505   CL 103 05/14/2015 0655   CL 103 05/21/2012 1014   CO2 23 01/02/2016 1505   CO2 26 05/14/2015 0655   CO2 28 05/21/2012 1014   GLUCOSE 108 (H) 01/02/2016 1505   GLUCOSE 112 (H) 05/14/2015 0655   GLUCOSE 100 (H) 05/21/2012 1014  BUN 19 01/02/2016 1505   BUN 21 (H) 05/14/2015 0655   BUN 17 05/21/2012 1014   CREATININE 0.87 01/02/2016 1505   CREATININE 0.77 05/14/2015 0655   CREATININE 0.76 05/21/2012 1014   CREATININE 0.76 02/09/2012 0520   CREATININE 0.79 12/18/2011 0907   CREATININE 0.73 07/10/2011 1005   CALCIUM 8.6 (L) 01/02/2016 1505   CALCIUM 9.4 05/14/2015 0655   CALCIUM 9.1 05/21/2012 1014   GFRNONAA >60 01/02/2016 1505   GFRNONAA >60 05/14/2015 0655   GFRNONAA 79 (L) 02/09/2012 0520   GFRAA >60 01/02/2016 1505   GFRAA >60 05/14/2015 0655   GFRAA >90 02/09/2012 0520   CMP     Component Value Date/Time   NA 139 01/02/2016 1505   K 4.1 01/02/2016 1505   CL 111 01/02/2016 1505   CO2 23 01/02/2016 1505   GLUCOSE 108 (H) 01/02/2016 1505   BUN 19 01/02/2016 1505   CREATININE 0.87 01/02/2016 1505   CREATININE 0.76 05/21/2012 1014   CALCIUM 8.6 (L) 01/02/2016 1505   PROT 5.9 (L) 01/02/2016 1505    ALBUMIN 3.1 (L) 01/02/2016 1505   AST 21 01/02/2016 1505   ALT 10 (L) 01/02/2016 1505   ALKPHOS 47 01/02/2016 1505   BILITOT 0.6 01/02/2016 1505   GFRNONAA >60 01/02/2016 1505   GFRAA >60 01/02/2016 1505       Component Value Date/Time   WBC 9.0 01/02/2016 1505   WBC 9.5 05/14/2015 0655   WBC 6.5 05/21/2012 1014   HGB 10.9 (L) 01/02/2016 1505   HGB 12.4 05/14/2015 0655   HGB 12.7 05/21/2012 1014   HCT 33.8 (L) 01/02/2016 1505   HCT 37.1 05/14/2015 0655   HCT 37.8 05/21/2012 1014   MCV 88.9 01/02/2016 1505   MCV 87.7 05/14/2015 0655   MCV 84.6 05/21/2012 1014    Lipid Panel     Component Value Date/Time   CHOL 112 01/03/2016 0512   TRIG 70 01/03/2016 0512   HDL 49 01/03/2016 0512   CHOLHDL 2.3 01/03/2016 0512   VLDL 14 01/03/2016 0512   LDLCALC 49 01/03/2016 0512    ABG No results found for: PHART, PCO2ART, PO2ART, HCO3, TCO2, ACIDBASEDEF, O2SAT   No results found for: TSH BNP (last 3 results)  Recent Labs  01/02/16 1505  BNP 78.5    ProBNP (last 3 results) No results for input(s): PROBNP in the last 8760 hours.  Cardiac Panel (last 3 results) No results for input(s): CKTOTAL, CKMB, TROPONINI, RELINDX in the last 72 hours.  Iron/TIBC/Ferritin/ %Sat No results found for: IRON, TIBC, FERRITIN, IRONPCTSAT   EKG Orders placed or performed during the hospital encounter of 01/02/16  . EKG 12-Lead  . EKG 12-Lead  . EKG 12-Lead  . EKG 12-Lead  . EKG 12-Lead  . EKG 12-Lead  . EKG 12-Lead     Prior Assessment and Plan Problem List as of 03/17/2016 Reviewed: 01/03/2016 10:49 AM by Pixie Casino, MD     Cardiovascular and Mediastinum   Stress-induced cardiomyopathy   Last Assessment & Plan 07/24/2012 Office Visit Written 07/24/2012  9:17 AM by Satira Sark, MD    History of Tako-tsubo cardiomyopathy - recurrence with type II NSTEMI in August of last year, LVEF was 25% at that point. She is now off Lopressor, questionable allergy. Plan is to followup  with an echocardiogram to reassess LV function, as she has had normalization with similar situation before. We might try low-dose Coreg next. She reports no chest pain symptoms, trying to manage  her stress level.      Takotsubo syndrome   Last Assessment & Plan 05/06/2012 Office Visit Written 05/06/2012 10:23 PM by Alycia Rossetti, MD    No longer on BB, will f/u cards, no recent CP, restart ASA, needs FLP done        Digestive   Constipation   Last Assessment & Plan 01/24/2012 Office Visit Written 01/24/2012  3:42 PM by Orvil Feil, NP    Miralax prn. Colonoscopy in near future, none has been performed prior.         Musculoskeletal and Integument   Eczema   Last Assessment & Plan 08/10/2011 Office Visit Written 08/10/2011 12:30 PM by Alycia Rossetti, MD    Improved        Other   Anemia   Last Assessment & Plan 08/10/2011 Office Visit Written 08/10/2011 12:30 PM by Alycia Rossetti, MD    Hemoglobin is stable. She does not need any medications.      Vitamin D deficiency   Last Assessment & Plan 06/29/2011 Office Visit Written 06/29/2011  3:07 PM by Alycia Rossetti, MD    Vitamin D level to be drawn      Incontinence   Last Assessment & Plan 05/06/2012 Office Visit Written 05/06/2012 10:24 PM by Alycia Rossetti, MD    Restart vesicare      Anxiety   Last Assessment & Plan 05/06/2012 Office Visit Written 05/06/2012 10:25 PM by Alycia Rossetti, MD    She has a lot of anxiety, this can worsen her cardiac symptoms restart paxil at 10mg       Hypertriglyceridemia   Last Assessment & Plan 08/10/2011 Office Visit Written 08/10/2011 12:31 PM by Alycia Rossetti, MD    Mild elevation in triglycerides. Patient does not need any medications. She states she will avoid any fried foods and she is avoiding meats      LLQ abdominal pain   Last Assessment & Plan 12/18/2011 Office Visit Edited 12/18/2011  9:08 AM by Alycia Rossetti, MD    Continued abdominal pain mostly in left lower  quadrant,abdominal exam benign itself, She does complain of some bloating will send to GI if ultrasound negative CMET, CBC, lipase       Pelvic pain in female   Last Assessment & Plan 05/06/2012 Office Visit Written 05/06/2012 10:24 PM by Alycia Rossetti, MD    Pain persist, pretty benign ultrasound, s/p hysterectomy, benign GI work up with exception of mild constipation, discussed sending to GYN she wants to wait until January      Lung nodule   Last Assessment & Plan 12/18/2011 Office Visit Written 12/18/2011  9:06 AM by Alycia Rossetti, MD    Right base lung nodule seen, will obtain CT chest to evaluate, this was an incidental finding      Elevated troponin       Imaging: No results found.

## 2016-04-03 DIAGNOSIS — I10 Essential (primary) hypertension: Secondary | ICD-10-CM | POA: Diagnosis not present

## 2016-04-03 DIAGNOSIS — E1165 Type 2 diabetes mellitus with hyperglycemia: Secondary | ICD-10-CM | POA: Diagnosis not present

## 2016-04-03 DIAGNOSIS — E784 Other hyperlipidemia: Secondary | ICD-10-CM | POA: Diagnosis not present

## 2016-06-06 DIAGNOSIS — E1165 Type 2 diabetes mellitus with hyperglycemia: Secondary | ICD-10-CM | POA: Diagnosis not present

## 2016-06-06 DIAGNOSIS — E784 Other hyperlipidemia: Secondary | ICD-10-CM | POA: Diagnosis not present

## 2016-06-06 DIAGNOSIS — I10 Essential (primary) hypertension: Secondary | ICD-10-CM | POA: Diagnosis not present

## 2016-06-20 DIAGNOSIS — I10 Essential (primary) hypertension: Secondary | ICD-10-CM | POA: Diagnosis not present

## 2016-06-20 DIAGNOSIS — E1165 Type 2 diabetes mellitus with hyperglycemia: Secondary | ICD-10-CM | POA: Diagnosis not present

## 2016-06-20 DIAGNOSIS — E784 Other hyperlipidemia: Secondary | ICD-10-CM | POA: Diagnosis not present

## 2016-06-20 DIAGNOSIS — M170 Bilateral primary osteoarthritis of knee: Secondary | ICD-10-CM | POA: Diagnosis not present

## 2016-06-27 DIAGNOSIS — E784 Other hyperlipidemia: Secondary | ICD-10-CM | POA: Diagnosis not present

## 2016-06-27 DIAGNOSIS — M170 Bilateral primary osteoarthritis of knee: Secondary | ICD-10-CM | POA: Diagnosis not present

## 2016-06-27 DIAGNOSIS — I10 Essential (primary) hypertension: Secondary | ICD-10-CM | POA: Diagnosis not present

## 2016-06-27 DIAGNOSIS — E1165 Type 2 diabetes mellitus with hyperglycemia: Secondary | ICD-10-CM | POA: Diagnosis not present

## 2016-07-24 DIAGNOSIS — E1165 Type 2 diabetes mellitus with hyperglycemia: Secondary | ICD-10-CM | POA: Diagnosis not present

## 2016-07-24 DIAGNOSIS — M170 Bilateral primary osteoarthritis of knee: Secondary | ICD-10-CM | POA: Diagnosis not present

## 2016-07-24 DIAGNOSIS — I10 Essential (primary) hypertension: Secondary | ICD-10-CM | POA: Diagnosis not present

## 2016-07-24 DIAGNOSIS — E784 Other hyperlipidemia: Secondary | ICD-10-CM | POA: Diagnosis not present

## 2016-08-02 DIAGNOSIS — M1611 Unilateral primary osteoarthritis, right hip: Secondary | ICD-10-CM | POA: Diagnosis not present

## 2016-08-15 DIAGNOSIS — I10 Essential (primary) hypertension: Secondary | ICD-10-CM | POA: Diagnosis not present

## 2016-08-15 DIAGNOSIS — M170 Bilateral primary osteoarthritis of knee: Secondary | ICD-10-CM | POA: Diagnosis not present

## 2016-08-15 DIAGNOSIS — E1165 Type 2 diabetes mellitus with hyperglycemia: Secondary | ICD-10-CM | POA: Diagnosis not present

## 2016-08-15 DIAGNOSIS — E784 Other hyperlipidemia: Secondary | ICD-10-CM | POA: Diagnosis not present

## 2016-09-29 DIAGNOSIS — I10 Essential (primary) hypertension: Secondary | ICD-10-CM | POA: Diagnosis not present

## 2016-09-29 DIAGNOSIS — E1165 Type 2 diabetes mellitus with hyperglycemia: Secondary | ICD-10-CM | POA: Diagnosis not present

## 2016-09-29 DIAGNOSIS — E784 Other hyperlipidemia: Secondary | ICD-10-CM | POA: Diagnosis not present

## 2016-09-29 DIAGNOSIS — M170 Bilateral primary osteoarthritis of knee: Secondary | ICD-10-CM | POA: Diagnosis not present

## 2016-10-16 DIAGNOSIS — M1611 Unilateral primary osteoarthritis, right hip: Secondary | ICD-10-CM | POA: Diagnosis not present

## 2016-10-19 DIAGNOSIS — I10 Essential (primary) hypertension: Secondary | ICD-10-CM | POA: Diagnosis not present

## 2016-10-19 DIAGNOSIS — E784 Other hyperlipidemia: Secondary | ICD-10-CM | POA: Diagnosis not present

## 2016-10-19 DIAGNOSIS — M170 Bilateral primary osteoarthritis of knee: Secondary | ICD-10-CM | POA: Diagnosis not present

## 2016-10-19 DIAGNOSIS — E1165 Type 2 diabetes mellitus with hyperglycemia: Secondary | ICD-10-CM | POA: Diagnosis not present

## 2016-10-19 DIAGNOSIS — Z0000 Encounter for general adult medical examination without abnormal findings: Secondary | ICD-10-CM | POA: Diagnosis not present

## 2016-10-19 DIAGNOSIS — M25551 Pain in right hip: Secondary | ICD-10-CM | POA: Diagnosis not present

## 2016-10-25 DIAGNOSIS — M25551 Pain in right hip: Secondary | ICD-10-CM | POA: Diagnosis not present

## 2016-10-25 DIAGNOSIS — M1611 Unilateral primary osteoarthritis, right hip: Secondary | ICD-10-CM | POA: Diagnosis not present

## 2016-10-26 DIAGNOSIS — E1165 Type 2 diabetes mellitus with hyperglycemia: Secondary | ICD-10-CM | POA: Diagnosis not present

## 2016-10-26 DIAGNOSIS — I10 Essential (primary) hypertension: Secondary | ICD-10-CM | POA: Diagnosis not present

## 2016-10-26 DIAGNOSIS — E784 Other hyperlipidemia: Secondary | ICD-10-CM | POA: Diagnosis not present

## 2016-10-27 DIAGNOSIS — M1611 Unilateral primary osteoarthritis, right hip: Secondary | ICD-10-CM | POA: Diagnosis not present

## 2016-11-15 DIAGNOSIS — E1165 Type 2 diabetes mellitus with hyperglycemia: Secondary | ICD-10-CM | POA: Diagnosis not present

## 2016-11-15 DIAGNOSIS — I10 Essential (primary) hypertension: Secondary | ICD-10-CM | POA: Diagnosis not present

## 2016-11-15 DIAGNOSIS — E784 Other hyperlipidemia: Secondary | ICD-10-CM | POA: Diagnosis not present

## 2016-11-29 DIAGNOSIS — M25551 Pain in right hip: Secondary | ICD-10-CM | POA: Diagnosis not present

## 2016-11-29 DIAGNOSIS — M1611 Unilateral primary osteoarthritis, right hip: Secondary | ICD-10-CM | POA: Diagnosis not present

## 2016-11-29 DIAGNOSIS — M25561 Pain in right knee: Secondary | ICD-10-CM | POA: Diagnosis not present

## 2016-12-14 ENCOUNTER — Encounter: Payer: Self-pay | Admitting: Internal Medicine

## 2016-12-22 DIAGNOSIS — I10 Essential (primary) hypertension: Secondary | ICD-10-CM | POA: Diagnosis not present

## 2016-12-22 DIAGNOSIS — E784 Other hyperlipidemia: Secondary | ICD-10-CM | POA: Diagnosis not present

## 2016-12-22 DIAGNOSIS — E1165 Type 2 diabetes mellitus with hyperglycemia: Secondary | ICD-10-CM | POA: Diagnosis not present

## 2017-01-22 DIAGNOSIS — M25551 Pain in right hip: Secondary | ICD-10-CM | POA: Diagnosis not present

## 2017-01-22 DIAGNOSIS — E119 Type 2 diabetes mellitus without complications: Secondary | ICD-10-CM | POA: Diagnosis not present

## 2017-01-22 DIAGNOSIS — M1611 Unilateral primary osteoarthritis, right hip: Secondary | ICD-10-CM | POA: Diagnosis not present

## 2017-01-23 DIAGNOSIS — R8290 Unspecified abnormal findings in urine: Secondary | ICD-10-CM | POA: Diagnosis not present

## 2017-01-23 DIAGNOSIS — Z7982 Long term (current) use of aspirin: Secondary | ICD-10-CM | POA: Diagnosis not present

## 2017-01-23 DIAGNOSIS — M1611 Unilateral primary osteoarthritis, right hip: Secondary | ICD-10-CM | POA: Diagnosis not present

## 2017-01-23 DIAGNOSIS — Z79899 Other long term (current) drug therapy: Secondary | ICD-10-CM | POA: Diagnosis not present

## 2017-01-23 DIAGNOSIS — E119 Type 2 diabetes mellitus without complications: Secondary | ICD-10-CM | POA: Diagnosis not present

## 2017-01-23 DIAGNOSIS — I10 Essential (primary) hypertension: Secondary | ICD-10-CM | POA: Diagnosis not present

## 2017-01-23 DIAGNOSIS — Z01818 Encounter for other preprocedural examination: Secondary | ICD-10-CM | POA: Diagnosis not present

## 2017-01-23 DIAGNOSIS — Z7984 Long term (current) use of oral hypoglycemic drugs: Secondary | ICD-10-CM | POA: Diagnosis not present

## 2017-01-23 DIAGNOSIS — I252 Old myocardial infarction: Secondary | ICD-10-CM | POA: Diagnosis not present

## 2017-01-24 ENCOUNTER — Other Ambulatory Visit: Payer: Self-pay | Admitting: Cardiology

## 2017-01-25 ENCOUNTER — Other Ambulatory Visit: Payer: Self-pay | Admitting: Cardiology

## 2017-01-29 DIAGNOSIS — E1165 Type 2 diabetes mellitus with hyperglycemia: Secondary | ICD-10-CM | POA: Diagnosis not present

## 2017-01-29 DIAGNOSIS — E119 Type 2 diabetes mellitus without complications: Secondary | ICD-10-CM | POA: Diagnosis not present

## 2017-01-29 DIAGNOSIS — E784 Other hyperlipidemia: Secondary | ICD-10-CM | POA: Diagnosis not present

## 2017-01-29 DIAGNOSIS — M25551 Pain in right hip: Secondary | ICD-10-CM | POA: Diagnosis not present

## 2017-01-29 DIAGNOSIS — I10 Essential (primary) hypertension: Secondary | ICD-10-CM | POA: Diagnosis not present

## 2017-02-25 NOTE — Progress Notes (Deleted)
Cardiology Office Note   Date:  02/25/2017   ID:  Debra Villarreal, DOB 1931-08-10, MRN [id number]  PCP:  Neale Burly, MD  Cardiologist:   Doreatha Lew chief complaint on file.     History of Present Illness: Debra Villarreal is a 83 y.o. female who presents for ongoing assessment and management of Taku Tsubo CM. She was last seen in the office on 03/17/2016. She has significant anxiety and is followed by psychiatry, and has history of Type II diabetes, and hyperlipidemia. She is intolerant of BB.    Past Medical History:  Diagnosis Date  . Anxiety   . Arthritis   . Bilateral cataracts   . Diabetes mellitus without complication ([place])   . Hypertension   . Takotsubo cardiomyopathy 2007   Normary coronaries 2007, initial EF 18% improved to 60%   . Uterine cancer [facility])     Past Surgical History:  Procedure Laterality Date  . ABDOMINAL HYSTERECTOMY     1966  . APPENDECTOMY    . CATARACT EXTRACTION W/PHACO  10/12/2011   Procedure: CATARACT EXTRACTION PHACO AND INTRAOCULAR LENS PLACEMENT (IOC);  Surgeon: Tonny Branch, MD;  Location: [facility] ORS;  Service: Ophthalmology;  Laterality: Left;  CDE:18.57  . CATARACT EXTRACTION W/PHACO  10/23/2011   Procedure: CATARACT EXTRACTION PHACO AND INTRAOCULAR LENS PLACEMENT (IOC);  Surgeon: Tonny Branch, MD;  Location: [facility] ORS;  Service: Ophthalmology;  Laterality: Right;  CDE 18.09  . COLONOSCOPY  01/31/2012   Procedure: COLONOSCOPY;  Surgeon: Daneil Dolin, MD;  Location: [facility] ENDO SUITE;  Service: Endoscopy;  Laterality: N/A;  1:45  . LEFT HEART CATHETERIZATION WITH CORONARY ANGIOGRAM N/A 02/09/2012   Procedure: LEFT HEART CATHETERIZATION WITH CORONARY ANGIOGRAM;  Surgeon: Thayer Headings, MD;  Location: [facility] CATH LAB;  Service: Cardiovascular;  Laterality: N/A;     Current Outpatient Prescriptions  Medication Sig Dispense Refill  . acetaminophen (TYLENOL) 325 MG tablet Take 325 mg by mouth every 6 (six) hours as needed for mild pain or moderate pain.      Marland Kitchen aspirin EC 81 MG tablet Take 81 mg by mouth daily.    . busPIRone (BUSPAR) 5 MG tablet Take 1 tablet (5 mg total) by mouth 2 (two) times daily. 60 tablet 11  . Calcium Citrate-Vitamin D (CALCIUM + D PO) Take 1 tablet by mouth 2 (two) times daily.    Marland Kitchen escitalopram (LEXAPRO) 5 MG tablet Take 1 tablet (5 mg total) by mouth at bedtime. 30 tablet 11  . Flaxseed, Linseed, (FLAX SEED OIL) 1000 MG CAPS Take 1 capsule by mouth daily.    . meloxicam (MOBIC) 15 MG tablet Take 1 tablet (15 mg total) by mouth daily. 30 tablet 4  . metFORMIN (GLUCOPHAGE) 500 MG tablet Take 500 mg by mouth at bedtime.    . nitroGLYCERIN (NITROSTAT) 0.4 MG SL tablet Place 1 tablet (0.4 mg total) under the tongue every 5 (five) minutes as needed. Up to 3 doses. If no relief after 3rd dose, proceed to ED for evaluation 25 tablet 2  . Omega-3 Fatty Acids (FISH OIL PO) Take 1 capsule by mouth daily with breakfast.    . simvastatin (ZOCOR) 5 MG tablet Take 5 mg by mouth daily.    . VESICARE 5 MG tablet TAKE ONE TABLET BY MOUTH ONCE DAILY 90 tablet 0   No current facility-administered medications for this visit.     Allergies:   Patient has no known allergies.    Social History:  The  patient  reports that she has never smoked. She has never used smokeless tobacco. She reports that she does not drink alcohol or use drugs.   Family History:  The patient's family history includes Cancer in her mother and sister; Heart attack in her father.    ROS: All other systems are reviewed and negative. Unless otherwise mentioned in H&P    PHYSICAL EXAM: VS:  There were no vitals taken for this visit. , BMI There is no height or weight on file to calculate BMI. GEN: Well nourished, well developed, in no acute distress  HEENT: normal  Neck: no JVD, carotid bruits, or masses Cardiac: ***RRR; no murmurs, rubs, or gallops,no edema  Respiratory:  clear to auscultation bilaterally, normal work of breathing GI: soft, nontender,  nondistended, + BS MS: no deformity or atrophy  Skin: warm and dry, no rash Neuro:  Strength and sensation are intact Psych: euthymic mood, full affect   EKG:  EKG {ACTION; IS/IS [id number]} ordered today. The ekg ordered today demonstrates ***   Recent Labs: No results found for requested labs within last 8760 hours.    Lipid Panel    Component Value Date/Time   CHOL 112 01/26/2016 0512   TRIG 70 2016-01-26 0512   HDL 49 2016-01-26 0512   CHOLHDL 2.3 01/26/2016 0512   VLDL 14 2016/01/26 0512   LDLCALC 49 January 26, 2016 0512      Wt Readings from Last 3 Encounters:  03/17/16 111 lb (50.3 kg)  01/02/16 115 lb (52.2 kg)  05/14/15 125 lb (56.7 kg)      Other studies Reviewed: Echocardiogram January 26, 2016 Left ventricle: inferior , inferior lateral septal and apical hypokinesis The cavity size was moderately dilated. Wall thickness was normal. Systolic function was severely reduced. The estimated ejection fraction was in the range of 25% to 30%. Doppler parameters are consistent with abnormal left ventricular relaxation (grade 1 diastolic dysfunction). - Mitral valve: There was mild regurgitation. - Right atrium: The atrium was mildly dilated. - Atrial septum: No defect or patent foramen ovale was identified. - Tricuspid valve: There was moderate regurgitation. - Pulmonary arteries: PA peak pressure: 43 mm Hg (S).  ASSESSMENT AND PLAN:  1.  ***   Current medicines are reviewed at length with the patient today.    Labs/ tests ordered today include: *** Phill Myron. West Pugh, ANP, AACC   02/25/2017 11:10 AM    [facility]  [address], [place], [place] [address] Phone: [phone]; Fax: [phone]

## 2017-02-26 ENCOUNTER — Ambulatory Visit: Payer: Self-pay | Admitting: Adult Health

## 2017-03-05 NOTE — Progress Notes (Signed)
Cardiology Office Note   Date:  03/06/2017   ID:  Debra Villarreal, DOB [birth year], MRN [id number]  PCP:  Neale Burly, MD  Cardiologist:  Domenic Polite Chief Complaint  Patient presents with  . Cardiomyopathy      History of Present Illness: Debra Villarreal is a 83 y.o. female who presents for chest pain and hx of Takotsubo cardiomyopathy. The patient also has high levels of anxiety. She has been followed by psychiatry. She was last seen one year ago and was found to be completely asymptomatic, she was continued on his home meds, was not started on beta blocker at that time as she had been on carvedilol causing significant hypotension. She is here for one year follow-up  She is here for pre-operative evaluation for right hip joint replacement. She has not been seen by a cardiologist since 2014. She is very hard of hearing. She is anxious to have right hip repair. She denies chest pain, palpitations, DOE, near syncope. She is usually active but is limited by right hip pain.  Past Medical History:  Diagnosis Date  . Anxiety   . Arthritis   . Bilateral cataracts   . Diabetes mellitus without complication ([place])   . Hypertension   . Takotsubo cardiomyopathy 2007   Normary coronaries 2007, initial EF 18% improved to 60%   . Uterine cancer [facility])     Past Surgical History:  Procedure Laterality Date  . ABDOMINAL HYSTERECTOMY     1966  . APPENDECTOMY    . CATARACT EXTRACTION W/PHACO  10/12/2011   Procedure: CATARACT EXTRACTION PHACO AND INTRAOCULAR LENS PLACEMENT (IOC);  Surgeon: Tonny Branch, MD;  Location: [facility];  Service: Ophthalmology;  Laterality: Left;  CDE:18.57  . CATARACT EXTRACTION W/PHACO  10/23/2011   Procedure: CATARACT EXTRACTION PHACO AND INTRAOCULAR LENS PLACEMENT (IOC);  Surgeon: Tonny Branch, MD;  Location: [facility];  Service: Ophthalmology;  Laterality: Right;  CDE 18.09  . COLONOSCOPY  01/31/2012   Procedure: COLONOSCOPY;  Surgeon: Daneil Dolin, MD;  Location[facility] END[facility];  Service: Endoscopy;  Laterality: N/A;  1:45  . LEFT HEART CATHETERIZATION WITH CORONARY ANGIOGRAM N/A 02/09/2012   Procedure: LEFT HEART CATHETERIZATION WITH CORONARY ANGIOGRAM;  Surgeon: Thayer Headings, MD;  Location: [facility] CATH LAB;  Service: Cardiovascular;  Laterality: N/A;     Current Outpatient Prescriptions  Medication Sig Dispense Refill  . acetaminophen (TYLENOL) 325 MG tablet Take 325 mg by mouth every 6 (six) hours as needed for mild pain or moderate pain.     Marland Kitchen aspirin EC 81 MG tablet Take 81 mg by mouth daily.    . busPIRone (BUSPAR) 5 MG tablet Take 1 tablet (5 mg total) by mouth 2 (two) times daily. 60 tablet 11  . Calcium Citrate-Vitamin D (CALCIUM + D PO) Take 1 tablet by mouth 2 (two) times daily.    Marland Kitchen escitalopram (LEXAPRO) 5 MG tablet Take 1 tablet (5 mg total) by mouth at bedtime. 30 tablet 11  . Flaxseed, Linseed, (FLAX SEED OIL) 1000 MG CAPS Take 1 capsule by mouth daily.    . meloxicam (MOBIC) 15 MG tablet Take 1 tablet (15 mg total) by mouth daily. 30 tablet 4  . metFORMIN (GLUCOPHAGE) 500 MG tablet Take 500 mg by mouth at bedtime.    . nitroGLYCERIN (NITROSTAT) 0.4 MG SL tablet Place 1 tablet (0.4 mg total) under the tongue every 5 (five) minutes as needed. Up to 3 doses. If no relief after 3rd dose, proceed to  ED for evaluation 25 tablet 2  . Omega-3 Fatty Acids (FISH OIL PO) Take 1 capsule by mouth daily with breakfast.    . simvastatin (ZOCOR) 5 MG tablet Take 5 mg by mouth daily.    . VESICARE 5 MG tablet TAKE ONE TABLET BY MOUTH ONCE DAILY 90 tablet 0   No current facility-administered medications for this visit.     Allergies:   Patient has no known allergies.    Social History:  The patient  reports that she has never smoked. She has never used smokeless tobacco. She reports that she does not drink alcohol or use drugs.   Family History:  The patient's family history includes Cancer in her mother and sister; Heart attack in her father.    ROS: All  other systems are reviewed and negative. Unless otherwise mentioned in H&P    PHYSICAL EXAM: VS:  BP 122/70   Pulse 100   Ht 5\' 4"  (1.626 m)   Wt 116 lb (52.6 kg)   SpO2 95%   BMI 19.91 kg/m  , BMI Body mass index is 19.91 kg/m. GEN: Well nourished, well developed, in no acute distress  HEENT: normal  Neck: no JVD, carotid bruits, or masses Cardiac: RRR; tachycardicno murmurs, rubs, or gallops,no edema  Respiratory:  clear to auscultation bilaterally, normal work of breathing GI: soft, nontender, nondistended, + BS MS: no deformity or atrophy  Skin: warm and dry, no rash Neuro:  Strength and sensation are intact Psych: euthymic mood, full affect   EKG:  EKG Sinus tachycardia rate of 101 bpm.   Recent Labs: No results found for requested labs within last 8760 hours.    Lipid Panel    Component Value Date/Time   CHOL 112 01/03/2016 0512   TRIG 70 01/03/2016 0512   HDL 49 01/03/2016 0512   CHOLHDL 2.3 01/03/2016 0512   VLDL 14 01/03/2016 0512   LDLCALC 49 01/03/2016 0512      Wt Readings from Last 3 Encounters:  03/06/17 116 lb (52.6 kg)  03/17/16 111 lb (50.3 kg)  01/02/16 115 lb (52.2 kg)      Other studies Reviewed:  Echocardiogram 01/03/2016 Left ventricle: inferior , inferior lateral septal and apical hypokinesis The cavity size was moderately dilated. Wall thickness was normal. Systolic function was severely reduced. The estimated ejection fraction was in the range of 25% to 30%. Doppler parameters are consistent with abnormal left ventricular relaxation (grade 1 diastolic dysfunction). - Mitral valve: There was mild regurgitation. - Right atrium: The atrium was mildly dilated. - Atrial septum: No defect or patent foramen ovale was identified. - Tricuspid valve: There was moderate regurgitation. - Pulmonary arteries: PA peak pressure: 43 mm Hg (S).  Cardiac Cath 10/25/2011 FINDINGS:  1. Left main:  Angiographically normal.  2. LAD:  Fairly  large vessel giving rise to three moderate sized      diagonals.  It was angiographically normal.  3. Circumflex:  Moderate-sized vessel giving rise to a single marginal.      It is angiographically normal.  4. RCA:  Moderate-sized dominant vessel.  It is angiographically normal.  5. No aortic stenosis or mitral regurgitation.  6. LV:  107/16/24.  EF 18% with akinesis with everything except the base.   IMPRESSION/PLAN:  The patient has angiographically normal coronary arteries.  Clinical syndrome and left ventriculogram are suggestive of Tako-Tsubo  cardiomyopathy.  Will change her Lopressor to Carvedilol.  Blood pressure is  currently too low to allow ACE inhibition.  Will reevaluate while she  remains hospitalized for at leas the next 24 hours.   ASSESSMENT AND PLAN:  1. Taku-Tsubo Cardiomyopathy: Most recent echocardiogram in 2017 demonstrated reduced EF of 25%-30% Normal coronaries per cardiac cath in 2013. Will repeat her echocardiogram for further assessment of LV fx. If reduced will consider repeating ischemic testing prior to releasing for right hip surgery. I have explained this to her at length. I am not sure she is understanding this process of pre-operative testing. Her daughter who accompanies her verbalizes understanding and will make sure she comes to her echo appointment. She will be seen again on follow up by Dr. Domenic Polite as she has not been seen by him since 2014.    Current medicines are reviewed at length with the patient today.    Labs/ tests ordered today include: Echocardiogram  Phill Myron. West Pugh, ANP, AACC   03/06/2017 4:31 PM    [facility] [address] [place], [place] [address] Phone: [phone]; Fax: [phone]

## 2017-03-06 ENCOUNTER — Encounter: Payer: Self-pay | Admitting: Adult Health

## 2017-03-06 ENCOUNTER — Ambulatory Visit (INDEPENDENT_AMBULATORY_CARE_PROVIDER_SITE_OTHER): Payer: Medicare Other | Admitting: Adult Health

## 2017-03-06 VITALS — BP 122/70 | HR 100 | Ht 64.0 in | Wt 116.0 lb

## 2017-03-06 DIAGNOSIS — I5181 Takotsubo syndrome: Secondary | ICD-10-CM | POA: Diagnosis not present

## 2017-03-06 DIAGNOSIS — Z01810 Encounter for preprocedural cardiovascular examination: Secondary | ICD-10-CM

## 2017-03-06 NOTE — Patient Instructions (Signed)
Medication Instructions:  Your physician recommends that you continue on your current medications as directed. Please refer to the Current Medication list given to you today.   Labwork: NONE   Testing/Procedures: NONE   Follow-Up: Your physician recommends that you schedule a follow-up appointment with Dr. McDowell.    Any Other Special Instructions Will Be Listed Below (If Applicable).     If you need a refill on your cardiac medications before your next appointment, please call your pharmacy.  Thank you for choosing [facility]!   

## 2017-03-08 DIAGNOSIS — E1165 Type 2 diabetes mellitus with hyperglycemia: Secondary | ICD-10-CM | POA: Diagnosis not present

## 2017-03-08 DIAGNOSIS — I10 Essential (primary) hypertension: Secondary | ICD-10-CM | POA: Diagnosis not present

## 2017-03-08 DIAGNOSIS — E784 Other hyperlipidemia: Secondary | ICD-10-CM | POA: Diagnosis not present

## 2017-03-13 ENCOUNTER — Telehealth: Payer: Self-pay | Admitting: *Deleted

## 2017-03-13 ENCOUNTER — Ambulatory Visit (HOSPITAL_COMMUNITY)
Admission: RE | Admit: 2017-03-13 | Discharge: 2017-03-13 | Disposition: A | Discharge status: Home / Self Care | Payer: Medicare Other | Source: Ambulatory Visit | Attending: Adult Health | Admitting: Adult Health

## 2017-03-13 DIAGNOSIS — I5030 Unspecified diastolic (congestive) heart failure: Secondary | ICD-10-CM | POA: Insufficient documentation

## 2017-03-13 DIAGNOSIS — I5181 Takotsubo syndrome: Secondary | ICD-10-CM | POA: Diagnosis not present

## 2017-03-13 DIAGNOSIS — I420 Dilated cardiomyopathy: Secondary | ICD-10-CM | POA: Insufficient documentation

## 2017-03-13 DIAGNOSIS — I081 Rheumatic disorders of both mitral and tricuspid valves: Secondary | ICD-10-CM | POA: Insufficient documentation

## 2017-03-13 NOTE — Progress Notes (Signed)
*  PRELIMINARY RESULTS* Echocardiogram 2D Echocardiogram has been performed.  Debra Villarreal 03/13/2017, 11:18 AM

## 2017-03-13 NOTE — Telephone Encounter (Signed)
-----   Message from Lendon Colonel, NP sent at 03/13/2017 12:52 PM EDT ----- Echocardiogram is essentially normal with some relaxation stiffening, mild. Keeping BP well controlled is main goal. No changes in regimen.

## 2017-03-13 NOTE — Telephone Encounter (Signed)
Called patient with test results. No answer. Left message to call back.  

## 2017-03-14 NOTE — Telephone Encounter (Signed)
Patient given test results and voiced understanding.  

## 2017-03-14 NOTE — Telephone Encounter (Signed)
[phone] please call pt back w/ results

## 2017-03-19 ENCOUNTER — Telehealth: Payer: Self-pay | Admitting: Cardiology

## 2017-03-19 NOTE — Telephone Encounter (Signed)
Received call from Dr. Case office wanting to know if Debra Villarreal has been cleared for surgery.   [phone].

## 2017-03-22 ENCOUNTER — Telehealth: Payer: Self-pay | Admitting: Cardiology

## 2017-03-22 NOTE — Telephone Encounter (Signed)
Needs surgical clearance / faxing form over to [facility] office / tg

## 2017-03-23 NOTE — Progress Notes (Signed)
Patient seen by Ms. Lawrence DNP on September 25 for preoperative cardiac clearance prior to elective knee replacement with Dr. Case. She has a history of Tako-tsubo cardiomyopathy with normal coronary arteries documented at cardiac catheterization in 2013. LVEF had been reduced in the range of 25-30% as of echocardiogram in July 2017, however recent repeat study on October 2 shows normal LVEF at 55-60% without wall motion abnormalities. She is currently on aspirin and Zocor, no other cardiac medications on a standing basis. In the absence of recurring chest pain or breathlessness, no additional cardiac testing is indicated at this time. She should be able to proceed with planned knee replacement at overall low to intermediate perioperative cardiac risk.

## 2017-04-09 DIAGNOSIS — I10 Essential (primary) hypertension: Secondary | ICD-10-CM | POA: Diagnosis not present

## 2017-04-09 DIAGNOSIS — E7849 Other hyperlipidemia: Secondary | ICD-10-CM | POA: Diagnosis not present

## 2017-04-09 DIAGNOSIS — E119 Type 2 diabetes mellitus without complications: Secondary | ICD-10-CM | POA: Diagnosis not present

## 2017-04-10 DIAGNOSIS — I10 Essential (primary) hypertension: Secondary | ICD-10-CM | POA: Diagnosis not present

## 2017-04-10 DIAGNOSIS — Z7984 Long term (current) use of oral hypoglycemic drugs: Secondary | ICD-10-CM | POA: Diagnosis not present

## 2017-04-10 DIAGNOSIS — E119 Type 2 diabetes mellitus without complications: Secondary | ICD-10-CM | POA: Diagnosis not present

## 2017-04-10 DIAGNOSIS — Z01818 Encounter for other preprocedural examination: Secondary | ICD-10-CM | POA: Diagnosis not present

## 2017-04-10 DIAGNOSIS — Z7982 Long term (current) use of aspirin: Secondary | ICD-10-CM | POA: Diagnosis not present

## 2017-04-10 DIAGNOSIS — Z79899 Other long term (current) drug therapy: Secondary | ICD-10-CM | POA: Diagnosis not present

## 2017-04-10 DIAGNOSIS — M1611 Unilateral primary osteoarthritis, right hip: Secondary | ICD-10-CM | POA: Diagnosis not present

## 2017-04-12 DIAGNOSIS — M1611 Unilateral primary osteoarthritis, right hip: Secondary | ICD-10-CM | POA: Diagnosis not present

## 2017-04-16 DIAGNOSIS — Z96641 Presence of right artificial hip joint: Secondary | ICD-10-CM | POA: Diagnosis not present

## 2017-04-16 DIAGNOSIS — Z789 Other specified health status: Secondary | ICD-10-CM | POA: Diagnosis not present

## 2017-04-16 DIAGNOSIS — Z471 Aftercare following joint replacement surgery: Secondary | ICD-10-CM | POA: Diagnosis not present

## 2017-04-17 DIAGNOSIS — M25551 Pain in right hip: Secondary | ICD-10-CM | POA: Diagnosis not present

## 2017-04-17 DIAGNOSIS — M898X8 Other specified disorders of bone, other site: Secondary | ICD-10-CM | POA: Diagnosis not present

## 2017-04-17 DIAGNOSIS — Z7984 Long term (current) use of oral hypoglycemic drugs: Secondary | ICD-10-CM | POA: Diagnosis not present

## 2017-04-17 DIAGNOSIS — Z79899 Other long term (current) drug therapy: Secondary | ICD-10-CM | POA: Diagnosis not present

## 2017-04-17 DIAGNOSIS — E785 Hyperlipidemia, unspecified: Secondary | ICD-10-CM | POA: Diagnosis not present

## 2017-04-17 DIAGNOSIS — Z905 Acquired absence of kidney: Secondary | ICD-10-CM | POA: Diagnosis not present

## 2017-04-17 DIAGNOSIS — M6281 Muscle weakness (generalized): Secondary | ICD-10-CM | POA: Diagnosis not present

## 2017-04-17 DIAGNOSIS — R262 Difficulty in walking, not elsewhere classified: Secondary | ICD-10-CM | POA: Diagnosis not present

## 2017-04-17 DIAGNOSIS — Z789 Other specified health status: Secondary | ICD-10-CM | POA: Diagnosis not present

## 2017-04-17 DIAGNOSIS — Z471 Aftercare following joint replacement surgery: Secondary | ICD-10-CM | POA: Diagnosis not present

## 2017-04-17 DIAGNOSIS — K449 Diaphragmatic hernia without obstruction or gangrene: Secondary | ICD-10-CM | POA: Diagnosis not present

## 2017-04-17 DIAGNOSIS — Z96641 Presence of right artificial hip joint: Secondary | ICD-10-CM | POA: Diagnosis not present

## 2017-04-17 DIAGNOSIS — E119 Type 2 diabetes mellitus without complications: Secondary | ICD-10-CM | POA: Diagnosis not present

## 2017-04-17 DIAGNOSIS — Z7982 Long term (current) use of aspirin: Secondary | ICD-10-CM | POA: Diagnosis not present

## 2017-04-17 DIAGNOSIS — M1611 Unilateral primary osteoarthritis, right hip: Secondary | ICD-10-CM | POA: Diagnosis not present

## 2017-04-17 DIAGNOSIS — D62 Acute posthemorrhagic anemia: Secondary | ICD-10-CM | POA: Diagnosis not present

## 2017-04-17 DIAGNOSIS — I10 Essential (primary) hypertension: Secondary | ICD-10-CM | POA: Diagnosis not present

## 2017-04-20 DIAGNOSIS — M1611 Unilateral primary osteoarthritis, right hip: Secondary | ICD-10-CM | POA: Diagnosis not present

## 2017-04-20 DIAGNOSIS — R262 Difficulty in walking, not elsewhere classified: Secondary | ICD-10-CM | POA: Diagnosis not present

## 2017-04-20 DIAGNOSIS — M6281 Muscle weakness (generalized): Secondary | ICD-10-CM | POA: Diagnosis not present

## 2017-04-20 DIAGNOSIS — E785 Hyperlipidemia, unspecified: Secondary | ICD-10-CM | POA: Diagnosis not present

## 2017-04-20 DIAGNOSIS — M25551 Pain in right hip: Secondary | ICD-10-CM | POA: Diagnosis not present

## 2017-04-20 DIAGNOSIS — Z471 Aftercare following joint replacement surgery: Secondary | ICD-10-CM | POA: Diagnosis not present

## 2017-04-20 DIAGNOSIS — D62 Acute posthemorrhagic anemia: Secondary | ICD-10-CM | POA: Diagnosis not present

## 2017-04-20 DIAGNOSIS — I10 Essential (primary) hypertension: Secondary | ICD-10-CM | POA: Diagnosis not present

## 2017-04-20 DIAGNOSIS — Z96641 Presence of right artificial hip joint: Secondary | ICD-10-CM | POA: Diagnosis not present

## 2017-04-20 DIAGNOSIS — E119 Type 2 diabetes mellitus without complications: Secondary | ICD-10-CM | POA: Diagnosis not present

## 2017-04-24 ENCOUNTER — Ambulatory Visit: Payer: Self-pay | Admitting: Cardiology

## 2017-04-25 DIAGNOSIS — Z96641 Presence of right artificial hip joint: Secondary | ICD-10-CM | POA: Insufficient documentation

## 2017-04-30 DIAGNOSIS — M1611 Unilateral primary osteoarthritis, right hip: Secondary | ICD-10-CM | POA: Diagnosis not present

## 2017-05-11 ENCOUNTER — Other Ambulatory Visit: Payer: Self-pay

## 2017-05-11 DIAGNOSIS — K5900 Constipation, unspecified: Secondary | ICD-10-CM | POA: Diagnosis not present

## 2017-05-11 DIAGNOSIS — Z79891 Long term (current) use of opiate analgesic: Secondary | ICD-10-CM | POA: Diagnosis not present

## 2017-05-11 DIAGNOSIS — D62 Acute posthemorrhagic anemia: Secondary | ICD-10-CM | POA: Diagnosis not present

## 2017-05-11 DIAGNOSIS — E119 Type 2 diabetes mellitus without complications: Secondary | ICD-10-CM | POA: Diagnosis not present

## 2017-05-11 DIAGNOSIS — Z96641 Presence of right artificial hip joint: Secondary | ICD-10-CM | POA: Diagnosis not present

## 2017-05-11 DIAGNOSIS — Z7982 Long term (current) use of aspirin: Secondary | ICD-10-CM | POA: Diagnosis not present

## 2017-05-11 DIAGNOSIS — I10 Essential (primary) hypertension: Secondary | ICD-10-CM | POA: Diagnosis not present

## 2017-05-11 DIAGNOSIS — E785 Hyperlipidemia, unspecified: Secondary | ICD-10-CM | POA: Diagnosis not present

## 2017-05-11 DIAGNOSIS — Z471 Aftercare following joint replacement surgery: Secondary | ICD-10-CM | POA: Diagnosis not present

## 2017-05-11 DIAGNOSIS — Z791 Long term (current) use of non-steroidal anti-inflammatories (NSAID): Secondary | ICD-10-CM | POA: Diagnosis not present

## 2017-05-11 NOTE — Patient Outreach (Signed)
[facility]) Care Management  05/11/2017  Debra Villarreal [birth year] [id number]   Medication adherence call to Debra Villarreal patient is showing past due under [facility] Ins.on Metformin 500 mg, Benazepril 5 mg, and Simvastatin 5 mg spoke with patients daughter she said Debra Villarreal has been in the hospital and then send to a nursing home, but now she is home and needs all her medication and if we can call [facility] and order all three medication call [facility] they will mail all her medication patient should expect them in seven business day.call patient's daughter back to let her know when to expect her medication.   [place] Management Direct Dial [phone]  Fax [phone] [email or web address]

## 2017-05-14 DIAGNOSIS — E119 Type 2 diabetes mellitus without complications: Secondary | ICD-10-CM | POA: Diagnosis not present

## 2017-05-14 DIAGNOSIS — Z471 Aftercare following joint replacement surgery: Secondary | ICD-10-CM | POA: Diagnosis not present

## 2017-05-14 DIAGNOSIS — Z79891 Long term (current) use of opiate analgesic: Secondary | ICD-10-CM | POA: Diagnosis not present

## 2017-05-14 DIAGNOSIS — Z96641 Presence of right artificial hip joint: Secondary | ICD-10-CM | POA: Diagnosis not present

## 2017-05-14 DIAGNOSIS — Z791 Long term (current) use of non-steroidal anti-inflammatories (NSAID): Secondary | ICD-10-CM | POA: Diagnosis not present

## 2017-05-14 DIAGNOSIS — K5900 Constipation, unspecified: Secondary | ICD-10-CM | POA: Diagnosis not present

## 2017-05-14 DIAGNOSIS — E785 Hyperlipidemia, unspecified: Secondary | ICD-10-CM | POA: Diagnosis not present

## 2017-05-14 DIAGNOSIS — Z7982 Long term (current) use of aspirin: Secondary | ICD-10-CM | POA: Diagnosis not present

## 2017-05-14 DIAGNOSIS — I10 Essential (primary) hypertension: Secondary | ICD-10-CM | POA: Diagnosis not present

## 2017-05-14 DIAGNOSIS — D62 Acute posthemorrhagic anemia: Secondary | ICD-10-CM | POA: Diagnosis not present

## 2017-05-15 DIAGNOSIS — Z791 Long term (current) use of non-steroidal anti-inflammatories (NSAID): Secondary | ICD-10-CM | POA: Diagnosis not present

## 2017-05-15 DIAGNOSIS — K5900 Constipation, unspecified: Secondary | ICD-10-CM | POA: Diagnosis not present

## 2017-05-15 DIAGNOSIS — Z96641 Presence of right artificial hip joint: Secondary | ICD-10-CM | POA: Diagnosis not present

## 2017-05-15 DIAGNOSIS — Z471 Aftercare following joint replacement surgery: Secondary | ICD-10-CM | POA: Diagnosis not present

## 2017-05-15 DIAGNOSIS — Z7982 Long term (current) use of aspirin: Secondary | ICD-10-CM | POA: Diagnosis not present

## 2017-05-15 DIAGNOSIS — D62 Acute posthemorrhagic anemia: Secondary | ICD-10-CM | POA: Diagnosis not present

## 2017-05-15 DIAGNOSIS — E119 Type 2 diabetes mellitus without complications: Secondary | ICD-10-CM | POA: Diagnosis not present

## 2017-05-15 DIAGNOSIS — E785 Hyperlipidemia, unspecified: Secondary | ICD-10-CM | POA: Diagnosis not present

## 2017-05-15 DIAGNOSIS — Z79891 Long term (current) use of opiate analgesic: Secondary | ICD-10-CM | POA: Diagnosis not present

## 2017-05-15 DIAGNOSIS — I10 Essential (primary) hypertension: Secondary | ICD-10-CM | POA: Diagnosis not present

## 2017-05-17 DIAGNOSIS — E7849 Other hyperlipidemia: Secondary | ICD-10-CM | POA: Diagnosis not present

## 2017-05-17 DIAGNOSIS — Z471 Aftercare following joint replacement surgery: Secondary | ICD-10-CM | POA: Diagnosis not present

## 2017-05-17 DIAGNOSIS — Z7982 Long term (current) use of aspirin: Secondary | ICD-10-CM | POA: Diagnosis not present

## 2017-05-17 DIAGNOSIS — Z6821 Body mass index (BMI) 21.0-21.9, adult: Secondary | ICD-10-CM | POA: Diagnosis not present

## 2017-05-17 DIAGNOSIS — Z791 Long term (current) use of non-steroidal anti-inflammatories (NSAID): Secondary | ICD-10-CM | POA: Diagnosis not present

## 2017-05-17 DIAGNOSIS — E119 Type 2 diabetes mellitus without complications: Secondary | ICD-10-CM | POA: Diagnosis not present

## 2017-05-17 DIAGNOSIS — K5900 Constipation, unspecified: Secondary | ICD-10-CM | POA: Diagnosis not present

## 2017-05-17 DIAGNOSIS — E785 Hyperlipidemia, unspecified: Secondary | ICD-10-CM | POA: Diagnosis not present

## 2017-05-17 DIAGNOSIS — Z79891 Long term (current) use of opiate analgesic: Secondary | ICD-10-CM | POA: Diagnosis not present

## 2017-05-17 DIAGNOSIS — I10 Essential (primary) hypertension: Secondary | ICD-10-CM | POA: Diagnosis not present

## 2017-05-17 DIAGNOSIS — D62 Acute posthemorrhagic anemia: Secondary | ICD-10-CM | POA: Diagnosis not present

## 2017-05-17 DIAGNOSIS — Z96641 Presence of right artificial hip joint: Secondary | ICD-10-CM | POA: Diagnosis not present

## 2017-05-18 ENCOUNTER — Ambulatory Visit (INDEPENDENT_AMBULATORY_CARE_PROVIDER_SITE_OTHER): Payer: Medicare Other | Admitting: Cardiology

## 2017-05-18 ENCOUNTER — Encounter: Payer: Self-pay | Admitting: Cardiology

## 2017-05-18 VITALS — BP 100/64 | HR 76 | Ht 64.0 in | Wt 125.0 lb

## 2017-05-18 DIAGNOSIS — I38 Endocarditis, valve unspecified: Secondary | ICD-10-CM | POA: Diagnosis not present

## 2017-05-18 DIAGNOSIS — Z791 Long term (current) use of non-steroidal anti-inflammatories (NSAID): Secondary | ICD-10-CM | POA: Diagnosis not present

## 2017-05-18 DIAGNOSIS — Z471 Aftercare following joint replacement surgery: Secondary | ICD-10-CM | POA: Diagnosis not present

## 2017-05-18 DIAGNOSIS — Z79891 Long term (current) use of opiate analgesic: Secondary | ICD-10-CM | POA: Diagnosis not present

## 2017-05-18 DIAGNOSIS — K5900 Constipation, unspecified: Secondary | ICD-10-CM | POA: Diagnosis not present

## 2017-05-18 DIAGNOSIS — E119 Type 2 diabetes mellitus without complications: Secondary | ICD-10-CM | POA: Diagnosis not present

## 2017-05-18 DIAGNOSIS — Z96641 Presence of right artificial hip joint: Secondary | ICD-10-CM | POA: Diagnosis not present

## 2017-05-18 DIAGNOSIS — Z8679 Personal history of other diseases of the circulatory system: Secondary | ICD-10-CM | POA: Diagnosis not present

## 2017-05-18 DIAGNOSIS — E785 Hyperlipidemia, unspecified: Secondary | ICD-10-CM | POA: Diagnosis not present

## 2017-05-18 DIAGNOSIS — D62 Acute posthemorrhagic anemia: Secondary | ICD-10-CM | POA: Diagnosis not present

## 2017-05-18 DIAGNOSIS — I10 Essential (primary) hypertension: Secondary | ICD-10-CM | POA: Diagnosis not present

## 2017-05-18 DIAGNOSIS — Z7982 Long term (current) use of aspirin: Secondary | ICD-10-CM | POA: Diagnosis not present

## 2017-05-18 NOTE — Progress Notes (Signed)
Cardiology Office Note  Date: 05/18/2017   ID: Debra Villarreal, DOB 01-01-1932, MRN [id number]  PCP: Debra Burly, MD  Primary Cardiologist: Debra Lesches, MD   Chief Complaint  Patient presents with  . History of cardiomyopathy    History of Present Illness: Debra Villarreal is an 83 y.o. female last seen by Debra Villarreal in September.  She presents for a routine office visit, has since undergone right hip surgery with Dr. Case in [place].  She had a rehabilitation stay for about 3 weeks and has been back home in the last week.  She is using a walker, continues to progress.  She reports no chest pain, palpitations, or syncope.  She does have lower leg edema, right worse than left.  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  Recent LVEF was in normal range at 55-60% with mild diastolic dysfunction, also moderate mitral regurgitation and tricuspid regurgitation.  I went over her current medications which are outlined below.  She has not required standing diuretic therapy.  She follows with Dr. Sherrie Sport for primary care.  Past Medical History:  Diagnosis Date  . Anxiety   . Arthritis   . Bilateral cataracts   . Diabetes mellitus without complication ([place])   . Hypertension   . Takotsubo cardiomyopathy 2007   Normary coronaries 2007, initial EF 18% improved to 60%   . Uterine cancer [facility])     Past Surgical History:  Procedure Laterality Date  . ABDOMINAL HYSTERECTOMY     1966  . APPENDECTOMY    . CATARACT EXTRACTION W/PHACO  10/12/2011   Procedure: CATARACT EXTRACTION PHACO AND INTRAOCULAR LENS PLACEMENT (IOC);  Surgeon: Debra Branch, MD;  Location: [facility] ORS;  Service: Ophthalmology;  Laterality: Left;  CDE:18.57  . CATARACT EXTRACTION W/PHACO  10/23/2011   Procedure: CATARACT EXTRACTION PHACO AND INTRAOCULAR LENS PLACEMENT (IOC);  Surgeon: Debra Branch, MD;  Location: [facility] ORS;  Service: Ophthalmology;   Laterality: Right;  CDE 18.09  . COLONOSCOPY  01/31/2012   Procedure: COLONOSCOPY;  Surgeon: Debra Dolin, MD;  Location: [facility] ENDO SUITE;  Service: Endoscopy;  Laterality: N/A;  1:45  . LEFT HEART CATHETERIZATION WITH CORONARY ANGIOGRAM N/A 02/09/2012   Procedure: LEFT HEART CATHETERIZATION WITH CORONARY ANGIOGRAM;  Surgeon: Debra Headings, MD;  Location: [facility] C[facility] LAB;  Service: Cardiovascular;  Laterality: N/A;    Current Outpatient Medications  Medication Sig Dispense Refill  . acetaminophen (TYLENOL) 325 MG tablet Take 325 mg by mouth every 6 (six) hours as needed for mild pain or moderate pain.     Marland Kitchen aspirin EC 81 MG tablet Take 81 mg by mouth daily.    . busPIRone (BUSPAR) 5 MG tablet Take 1 tablet (5 mg total) by mouth 2 (two) times daily. 60 tablet 11  . Calcium Citrate-Vitamin D (CALCIUM + D PO) Take 1 tablet by mouth 2 (two) times daily.    Marland Kitchen escitalopram (LEXAPRO) 5 MG tablet Take 1 tablet (5 mg total) by mouth at bedtime. 30 tablet 11  . Flaxseed, Linseed, (FLAX SEED OIL) 1000 MG CAPS Take 1 capsule by mouth daily.    . meloxicam (MOBIC) 15 MG tablet Take 1 tablet (15 mg total) by mouth daily. 30 tablet 4  . metFORMIN (GLUCOPHAGE) 500 MG tablet Take 500 mg by mouth at bedtime.    . nitroGLYCERIN (NITROSTAT) 0.4 MG SL tablet Place 1 tablet (0.4 mg total) under the  tongue every 5 (five) minutes as needed. Up to 3 doses. If no relief after 3rd dose, proceed to ED for evaluation 25 tablet 2  . Omega-3 Fatty Acids (FISH OIL PO) Take 1 capsule by mouth daily with breakfast.    . simvastatin (ZOCOR) 5 MG tablet Take 5 mg by mouth daily.    . VESICARE 5 MG tablet TAKE ONE TABLET BY MOUTH ONCE DAILY 90 tablet 0   No current facility-administered medications for this visit.    Allergies:  Patient has no known allergies.   Social History: The patient  reports that  has never smoked. she has never used smokeless tobacco. She reports that she does not drink alcohol or use drugs.   ROS:   Please see the history of present illness. Otherwise, complete review of systems is positive for improving left hip stiffness.  All other systems are reviewed and negative.   Physical Exam: VS:  BP 100/64   Pulse 76   Ht 5\' 4"  (1.626 m)   Wt 125 lb (56.7 kg)   SpO2 98%   BMI 21.46 kg/m , BMI Body mass index is 21.46 kg/m.  Wt Readings from Last 3 Encounters:  05/18/17 125 lb (56.7 kg)  03/06/17 116 lb (52.6 kg)  03/17/16 111 lb (50.3 kg)    General: Elderly woman, appears comfortable at rest. HEENT: Conjunctiva and lids normal, oropharynx clear. Neck: Supple, no elevated JVP or carotid bruits, no thyromegaly. Lungs: Clear to auscultation, nonlabored breathing at rest. Cardiac: Regular rate and rhythm, no S3 or significant systolic murmur, no pericardial rub. Abdomen: Soft, nontender, bowel sounds present. Extremities: Lower leg edema, right greater than left, distal pulses 2+. Skin: Warm and dry. Musculoskeletal: No kyphosis. Neuropsychiatric: Alert and oriented x3, affect grossly appropriate.  ECG: I personally reviewed the tracing from 03/06/2017 which showed sinus tachycardia.  Recent Labwork:    Component Value Date/Time   CHOL 112 01/03/2016 0512   TRIG 70 01/03/2016 0512   HDL 49 01/03/2016 0512   CHOLHDL 2.3 01/03/2016 0512   VLDL 14 01/03/2016 0512   LDLCALC 49 01/03/2016 0512    Other Studies Reviewed Today:  Echocardiogram 03/13/2017: Study Conclusions  - Left ventricle: The cavity size was normal. Wall thickness was   normal. Systolic function was normal. The estimated ejection   fraction was in the range of 55% to 60%. Wall motion was normal;   there were no regional wall motion abnormalities. Doppler   parameters are consistent with abnormal left ventricular   relaxation (grade 1 diastolic dysfunction). - Mitral valve: There was moderate regurgitation. - Right atrium: The atrium was mildly dilated. - Tricuspid valve: There was moderate  regurgitation. - Pulmonary arteries: PA peak pressure: 32 mm Hg (S).  Assessment and Plan:  1.  History of Tako-tsubo cardiomyopathy with normalization of LVEF, recently reassessed by echocardiogram in October.  At this point would continue with observation.  2.  Moderate mitral and tricuspid regurgitation.  Can consider a follow-up echocardiogram in 1 year's time.  Current medicines were reviewed with the patient today.  Disposition: Follow-up in 1 year.  Signed, Satira Sark, MD, [facility] 05/18/2017 12:29 PM    [facility] [place]. [address] [place],  [address] Phone: [phone]; Fax: [phone]

## 2017-05-18 NOTE — Patient Instructions (Signed)
Your physician wants you to follow-up in: 1 year with Dr Ferne Reus will receive a reminder letter in the mail two months in advance. If you don't receive a letter, please call our office to schedule the follow-up appointment.    Your physician recommends that you continue on your current medications as directed. Please refer to the Current Medication list given to you today.   If you need a refill on your cardiac medications before your next appointment, please call your pharmacy.     No tests or lab work ordered today.     Thank you for choosing [facility] !

## 2017-05-22 DIAGNOSIS — I10 Essential (primary) hypertension: Secondary | ICD-10-CM | POA: Diagnosis not present

## 2017-05-22 DIAGNOSIS — Z791 Long term (current) use of non-steroidal anti-inflammatories (NSAID): Secondary | ICD-10-CM | POA: Diagnosis not present

## 2017-05-22 DIAGNOSIS — Z79891 Long term (current) use of opiate analgesic: Secondary | ICD-10-CM | POA: Diagnosis not present

## 2017-05-22 DIAGNOSIS — E785 Hyperlipidemia, unspecified: Secondary | ICD-10-CM | POA: Diagnosis not present

## 2017-05-22 DIAGNOSIS — D62 Acute posthemorrhagic anemia: Secondary | ICD-10-CM | POA: Diagnosis not present

## 2017-05-22 DIAGNOSIS — Z471 Aftercare following joint replacement surgery: Secondary | ICD-10-CM | POA: Diagnosis not present

## 2017-05-22 DIAGNOSIS — K5900 Constipation, unspecified: Secondary | ICD-10-CM | POA: Diagnosis not present

## 2017-05-22 DIAGNOSIS — Z7982 Long term (current) use of aspirin: Secondary | ICD-10-CM | POA: Diagnosis not present

## 2017-05-22 DIAGNOSIS — E119 Type 2 diabetes mellitus without complications: Secondary | ICD-10-CM | POA: Diagnosis not present

## 2017-05-22 DIAGNOSIS — Z96641 Presence of right artificial hip joint: Secondary | ICD-10-CM | POA: Diagnosis not present

## 2017-05-23 DIAGNOSIS — Z79891 Long term (current) use of opiate analgesic: Secondary | ICD-10-CM | POA: Diagnosis not present

## 2017-05-23 DIAGNOSIS — E785 Hyperlipidemia, unspecified: Secondary | ICD-10-CM | POA: Diagnosis not present

## 2017-05-23 DIAGNOSIS — Z7982 Long term (current) use of aspirin: Secondary | ICD-10-CM | POA: Diagnosis not present

## 2017-05-23 DIAGNOSIS — Z96641 Presence of right artificial hip joint: Secondary | ICD-10-CM | POA: Diagnosis not present

## 2017-05-23 DIAGNOSIS — E119 Type 2 diabetes mellitus without complications: Secondary | ICD-10-CM | POA: Diagnosis not present

## 2017-05-23 DIAGNOSIS — D62 Acute posthemorrhagic anemia: Secondary | ICD-10-CM | POA: Diagnosis not present

## 2017-05-23 DIAGNOSIS — I10 Essential (primary) hypertension: Secondary | ICD-10-CM | POA: Diagnosis not present

## 2017-05-23 DIAGNOSIS — Z791 Long term (current) use of non-steroidal anti-inflammatories (NSAID): Secondary | ICD-10-CM | POA: Diagnosis not present

## 2017-05-23 DIAGNOSIS — Z471 Aftercare following joint replacement surgery: Secondary | ICD-10-CM | POA: Diagnosis not present

## 2017-05-23 DIAGNOSIS — K5900 Constipation, unspecified: Secondary | ICD-10-CM | POA: Diagnosis not present

## 2017-05-24 DIAGNOSIS — E119 Type 2 diabetes mellitus without complications: Secondary | ICD-10-CM | POA: Diagnosis not present

## 2017-05-24 DIAGNOSIS — Z471 Aftercare following joint replacement surgery: Secondary | ICD-10-CM | POA: Diagnosis not present

## 2017-05-24 DIAGNOSIS — E785 Hyperlipidemia, unspecified: Secondary | ICD-10-CM | POA: Diagnosis not present

## 2017-05-24 DIAGNOSIS — D62 Acute posthemorrhagic anemia: Secondary | ICD-10-CM | POA: Diagnosis not present

## 2017-05-24 DIAGNOSIS — Z79891 Long term (current) use of opiate analgesic: Secondary | ICD-10-CM | POA: Diagnosis not present

## 2017-05-24 DIAGNOSIS — K5900 Constipation, unspecified: Secondary | ICD-10-CM | POA: Diagnosis not present

## 2017-05-24 DIAGNOSIS — Z7982 Long term (current) use of aspirin: Secondary | ICD-10-CM | POA: Diagnosis not present

## 2017-05-24 DIAGNOSIS — I10 Essential (primary) hypertension: Secondary | ICD-10-CM | POA: Diagnosis not present

## 2017-05-24 DIAGNOSIS — Z791 Long term (current) use of non-steroidal anti-inflammatories (NSAID): Secondary | ICD-10-CM | POA: Diagnosis not present

## 2017-05-24 DIAGNOSIS — Z96641 Presence of right artificial hip joint: Secondary | ICD-10-CM | POA: Diagnosis not present

## 2017-05-25 DIAGNOSIS — Z96641 Presence of right artificial hip joint: Secondary | ICD-10-CM | POA: Diagnosis not present

## 2017-05-25 DIAGNOSIS — Z471 Aftercare following joint replacement surgery: Secondary | ICD-10-CM | POA: Diagnosis not present

## 2017-05-25 DIAGNOSIS — Z791 Long term (current) use of non-steroidal anti-inflammatories (NSAID): Secondary | ICD-10-CM | POA: Diagnosis not present

## 2017-05-25 DIAGNOSIS — Z79891 Long term (current) use of opiate analgesic: Secondary | ICD-10-CM | POA: Diagnosis not present

## 2017-05-25 DIAGNOSIS — I10 Essential (primary) hypertension: Secondary | ICD-10-CM | POA: Diagnosis not present

## 2017-05-25 DIAGNOSIS — D62 Acute posthemorrhagic anemia: Secondary | ICD-10-CM | POA: Diagnosis not present

## 2017-05-25 DIAGNOSIS — Z7982 Long term (current) use of aspirin: Secondary | ICD-10-CM | POA: Diagnosis not present

## 2017-05-25 DIAGNOSIS — K5900 Constipation, unspecified: Secondary | ICD-10-CM | POA: Diagnosis not present

## 2017-05-25 DIAGNOSIS — E119 Type 2 diabetes mellitus without complications: Secondary | ICD-10-CM | POA: Diagnosis not present

## 2017-05-25 DIAGNOSIS — E785 Hyperlipidemia, unspecified: Secondary | ICD-10-CM | POA: Diagnosis not present

## 2017-05-28 DIAGNOSIS — K5900 Constipation, unspecified: Secondary | ICD-10-CM | POA: Diagnosis not present

## 2017-05-28 DIAGNOSIS — E119 Type 2 diabetes mellitus without complications: Secondary | ICD-10-CM | POA: Diagnosis not present

## 2017-05-28 DIAGNOSIS — Z79891 Long term (current) use of opiate analgesic: Secondary | ICD-10-CM | POA: Diagnosis not present

## 2017-05-28 DIAGNOSIS — Z791 Long term (current) use of non-steroidal anti-inflammatories (NSAID): Secondary | ICD-10-CM | POA: Diagnosis not present

## 2017-05-28 DIAGNOSIS — Z471 Aftercare following joint replacement surgery: Secondary | ICD-10-CM | POA: Diagnosis not present

## 2017-05-28 DIAGNOSIS — I10 Essential (primary) hypertension: Secondary | ICD-10-CM | POA: Diagnosis not present

## 2017-05-28 DIAGNOSIS — Z96641 Presence of right artificial hip joint: Secondary | ICD-10-CM | POA: Diagnosis not present

## 2017-05-28 DIAGNOSIS — D62 Acute posthemorrhagic anemia: Secondary | ICD-10-CM | POA: Diagnosis not present

## 2017-05-28 DIAGNOSIS — E785 Hyperlipidemia, unspecified: Secondary | ICD-10-CM | POA: Diagnosis not present

## 2017-05-28 DIAGNOSIS — Z7982 Long term (current) use of aspirin: Secondary | ICD-10-CM | POA: Diagnosis not present

## 2017-05-30 DIAGNOSIS — Z791 Long term (current) use of non-steroidal anti-inflammatories (NSAID): Secondary | ICD-10-CM | POA: Diagnosis not present

## 2017-05-30 DIAGNOSIS — Z79891 Long term (current) use of opiate analgesic: Secondary | ICD-10-CM | POA: Diagnosis not present

## 2017-05-30 DIAGNOSIS — E785 Hyperlipidemia, unspecified: Secondary | ICD-10-CM | POA: Diagnosis not present

## 2017-05-30 DIAGNOSIS — E119 Type 2 diabetes mellitus without complications: Secondary | ICD-10-CM | POA: Diagnosis not present

## 2017-05-30 DIAGNOSIS — K5900 Constipation, unspecified: Secondary | ICD-10-CM | POA: Diagnosis not present

## 2017-05-30 DIAGNOSIS — I10 Essential (primary) hypertension: Secondary | ICD-10-CM | POA: Diagnosis not present

## 2017-05-30 DIAGNOSIS — Z7982 Long term (current) use of aspirin: Secondary | ICD-10-CM | POA: Diagnosis not present

## 2017-05-30 DIAGNOSIS — Z471 Aftercare following joint replacement surgery: Secondary | ICD-10-CM | POA: Diagnosis not present

## 2017-05-30 DIAGNOSIS — Z96641 Presence of right artificial hip joint: Secondary | ICD-10-CM | POA: Diagnosis not present

## 2017-05-30 DIAGNOSIS — D62 Acute posthemorrhagic anemia: Secondary | ICD-10-CM | POA: Diagnosis not present

## 2017-05-31 DIAGNOSIS — Z79891 Long term (current) use of opiate analgesic: Secondary | ICD-10-CM | POA: Diagnosis not present

## 2017-05-31 DIAGNOSIS — Z471 Aftercare following joint replacement surgery: Secondary | ICD-10-CM | POA: Diagnosis not present

## 2017-05-31 DIAGNOSIS — E785 Hyperlipidemia, unspecified: Secondary | ICD-10-CM | POA: Diagnosis not present

## 2017-05-31 DIAGNOSIS — E119 Type 2 diabetes mellitus without complications: Secondary | ICD-10-CM | POA: Diagnosis not present

## 2017-05-31 DIAGNOSIS — I10 Essential (primary) hypertension: Secondary | ICD-10-CM | POA: Diagnosis not present

## 2017-05-31 DIAGNOSIS — Z791 Long term (current) use of non-steroidal anti-inflammatories (NSAID): Secondary | ICD-10-CM | POA: Diagnosis not present

## 2017-05-31 DIAGNOSIS — Z7982 Long term (current) use of aspirin: Secondary | ICD-10-CM | POA: Diagnosis not present

## 2017-05-31 DIAGNOSIS — K5900 Constipation, unspecified: Secondary | ICD-10-CM | POA: Diagnosis not present

## 2017-05-31 DIAGNOSIS — D62 Acute posthemorrhagic anemia: Secondary | ICD-10-CM | POA: Diagnosis not present

## 2017-05-31 DIAGNOSIS — Z96641 Presence of right artificial hip joint: Secondary | ICD-10-CM | POA: Diagnosis not present

## 2017-06-01 DIAGNOSIS — Z7982 Long term (current) use of aspirin: Secondary | ICD-10-CM | POA: Diagnosis not present

## 2017-06-01 DIAGNOSIS — D62 Acute posthemorrhagic anemia: Secondary | ICD-10-CM | POA: Diagnosis not present

## 2017-06-01 DIAGNOSIS — E119 Type 2 diabetes mellitus without complications: Secondary | ICD-10-CM | POA: Diagnosis not present

## 2017-06-01 DIAGNOSIS — E785 Hyperlipidemia, unspecified: Secondary | ICD-10-CM | POA: Diagnosis not present

## 2017-06-01 DIAGNOSIS — K5900 Constipation, unspecified: Secondary | ICD-10-CM | POA: Diagnosis not present

## 2017-06-01 DIAGNOSIS — Z471 Aftercare following joint replacement surgery: Secondary | ICD-10-CM | POA: Diagnosis not present

## 2017-06-01 DIAGNOSIS — Z96641 Presence of right artificial hip joint: Secondary | ICD-10-CM | POA: Diagnosis not present

## 2017-06-01 DIAGNOSIS — Z79891 Long term (current) use of opiate analgesic: Secondary | ICD-10-CM | POA: Diagnosis not present

## 2017-06-01 DIAGNOSIS — Z791 Long term (current) use of non-steroidal anti-inflammatories (NSAID): Secondary | ICD-10-CM | POA: Diagnosis not present

## 2017-06-01 DIAGNOSIS — I10 Essential (primary) hypertension: Secondary | ICD-10-CM | POA: Diagnosis not present

## 2017-06-13 DIAGNOSIS — Z96641 Presence of right artificial hip joint: Secondary | ICD-10-CM | POA: Diagnosis not present

## 2017-06-14 DIAGNOSIS — E119 Type 2 diabetes mellitus without complications: Secondary | ICD-10-CM | POA: Diagnosis not present

## 2017-06-14 DIAGNOSIS — I10 Essential (primary) hypertension: Secondary | ICD-10-CM | POA: Diagnosis not present

## 2017-06-14 DIAGNOSIS — E7849 Other hyperlipidemia: Secondary | ICD-10-CM | POA: Diagnosis not present

## 2017-08-15 DIAGNOSIS — E7849 Other hyperlipidemia: Secondary | ICD-10-CM | POA: Diagnosis not present

## 2017-08-15 DIAGNOSIS — I10 Essential (primary) hypertension: Secondary | ICD-10-CM | POA: Diagnosis not present

## 2017-08-15 DIAGNOSIS — Z6821 Body mass index (BMI) 21.0-21.9, adult: Secondary | ICD-10-CM | POA: Diagnosis not present

## 2017-08-15 DIAGNOSIS — E119 Type 2 diabetes mellitus without complications: Secondary | ICD-10-CM | POA: Diagnosis not present

## 2017-08-20 DIAGNOSIS — E119 Type 2 diabetes mellitus without complications: Secondary | ICD-10-CM | POA: Diagnosis not present

## 2017-08-20 DIAGNOSIS — Z6821 Body mass index (BMI) 21.0-21.9, adult: Secondary | ICD-10-CM | POA: Diagnosis not present

## 2017-08-20 DIAGNOSIS — E7849 Other hyperlipidemia: Secondary | ICD-10-CM | POA: Diagnosis not present

## 2017-08-20 DIAGNOSIS — I10 Essential (primary) hypertension: Secondary | ICD-10-CM | POA: Diagnosis not present

## 2017-09-07 DIAGNOSIS — I10 Essential (primary) hypertension: Secondary | ICD-10-CM | POA: Diagnosis not present

## 2017-09-07 DIAGNOSIS — E119 Type 2 diabetes mellitus without complications: Secondary | ICD-10-CM | POA: Diagnosis not present

## 2017-09-07 DIAGNOSIS — E7849 Other hyperlipidemia: Secondary | ICD-10-CM | POA: Diagnosis not present

## 2017-11-27 DIAGNOSIS — Z6821 Body mass index (BMI) 21.0-21.9, adult: Secondary | ICD-10-CM | POA: Diagnosis not present

## 2017-11-27 DIAGNOSIS — E119 Type 2 diabetes mellitus without complications: Secondary | ICD-10-CM | POA: Diagnosis not present

## 2017-11-27 DIAGNOSIS — I10 Essential (primary) hypertension: Secondary | ICD-10-CM | POA: Diagnosis not present

## 2017-11-27 DIAGNOSIS — E7849 Other hyperlipidemia: Secondary | ICD-10-CM | POA: Diagnosis not present

## 2017-12-11 DIAGNOSIS — I10 Essential (primary) hypertension: Secondary | ICD-10-CM | POA: Diagnosis not present

## 2017-12-11 DIAGNOSIS — E119 Type 2 diabetes mellitus without complications: Secondary | ICD-10-CM | POA: Diagnosis not present

## 2017-12-11 DIAGNOSIS — E7849 Other hyperlipidemia: Secondary | ICD-10-CM | POA: Diagnosis not present

## 2018-02-08 DIAGNOSIS — H353133 Nonexudative age-related macular degeneration, bilateral, advanced atrophic without subfoveal involvement: Secondary | ICD-10-CM | POA: Diagnosis not present

## 2018-03-01 DIAGNOSIS — E7849 Other hyperlipidemia: Secondary | ICD-10-CM | POA: Diagnosis not present

## 2018-03-01 DIAGNOSIS — I10 Essential (primary) hypertension: Secondary | ICD-10-CM | POA: Diagnosis not present

## 2018-03-01 DIAGNOSIS — E119 Type 2 diabetes mellitus without complications: Secondary | ICD-10-CM | POA: Diagnosis not present

## 2018-03-13 DIAGNOSIS — E119 Type 2 diabetes mellitus without complications: Secondary | ICD-10-CM | POA: Diagnosis not present

## 2018-03-13 DIAGNOSIS — Z6821 Body mass index (BMI) 21.0-21.9, adult: Secondary | ICD-10-CM | POA: Diagnosis not present

## 2018-03-13 DIAGNOSIS — Z0000 Encounter for general adult medical examination without abnormal findings: Secondary | ICD-10-CM | POA: Diagnosis not present

## 2018-03-13 DIAGNOSIS — Z1389 Encounter for screening for other disorder: Secondary | ICD-10-CM | POA: Diagnosis not present

## 2018-03-13 DIAGNOSIS — E7849 Other hyperlipidemia: Secondary | ICD-10-CM | POA: Diagnosis not present

## 2018-03-13 DIAGNOSIS — Z6823 Body mass index (BMI) 23.0-23.9, adult: Secondary | ICD-10-CM | POA: Diagnosis not present

## 2018-03-13 DIAGNOSIS — I10 Essential (primary) hypertension: Secondary | ICD-10-CM | POA: Diagnosis not present

## 2018-05-07 DIAGNOSIS — I10 Essential (primary) hypertension: Secondary | ICD-10-CM | POA: Diagnosis not present

## 2018-05-07 DIAGNOSIS — E119 Type 2 diabetes mellitus without complications: Secondary | ICD-10-CM | POA: Diagnosis not present

## 2018-05-07 DIAGNOSIS — E7849 Other hyperlipidemia: Secondary | ICD-10-CM | POA: Diagnosis not present

## 2018-06-18 DIAGNOSIS — E119 Type 2 diabetes mellitus without complications: Secondary | ICD-10-CM | POA: Diagnosis not present

## 2018-06-18 DIAGNOSIS — E7849 Other hyperlipidemia: Secondary | ICD-10-CM | POA: Diagnosis not present

## 2018-06-18 DIAGNOSIS — Z0000 Encounter for general adult medical examination without abnormal findings: Secondary | ICD-10-CM | POA: Diagnosis not present

## 2018-06-18 DIAGNOSIS — Z6824 Body mass index (BMI) 24.0-24.9, adult: Secondary | ICD-10-CM | POA: Diagnosis not present

## 2018-06-18 DIAGNOSIS — I10 Essential (primary) hypertension: Secondary | ICD-10-CM | POA: Diagnosis not present

## 2018-08-02 DIAGNOSIS — E7849 Other hyperlipidemia: Secondary | ICD-10-CM | POA: Diagnosis not present

## 2018-08-02 DIAGNOSIS — I10 Essential (primary) hypertension: Secondary | ICD-10-CM | POA: Diagnosis not present

## 2018-08-02 DIAGNOSIS — E119 Type 2 diabetes mellitus without complications: Secondary | ICD-10-CM | POA: Diagnosis not present

## 2018-08-28 DIAGNOSIS — I10 Essential (primary) hypertension: Secondary | ICD-10-CM | POA: Diagnosis not present

## 2018-08-28 DIAGNOSIS — E7849 Other hyperlipidemia: Secondary | ICD-10-CM | POA: Diagnosis not present

## 2018-08-28 DIAGNOSIS — E119 Type 2 diabetes mellitus without complications: Secondary | ICD-10-CM | POA: Diagnosis not present

## 2018-09-25 DIAGNOSIS — E119 Type 2 diabetes mellitus without complications: Secondary | ICD-10-CM | POA: Diagnosis not present

## 2018-09-25 DIAGNOSIS — E7849 Other hyperlipidemia: Secondary | ICD-10-CM | POA: Diagnosis not present

## 2018-09-25 DIAGNOSIS — I10 Essential (primary) hypertension: Secondary | ICD-10-CM | POA: Diagnosis not present

## 2018-10-01 DIAGNOSIS — I10 Essential (primary) hypertension: Secondary | ICD-10-CM | POA: Diagnosis not present

## 2018-10-01 DIAGNOSIS — Z0000 Encounter for general adult medical examination without abnormal findings: Secondary | ICD-10-CM | POA: Diagnosis not present

## 2018-10-01 DIAGNOSIS — E119 Type 2 diabetes mellitus without complications: Secondary | ICD-10-CM | POA: Diagnosis not present

## 2018-10-01 DIAGNOSIS — Z6824 Body mass index (BMI) 24.0-24.9, adult: Secondary | ICD-10-CM | POA: Diagnosis not present

## 2018-10-01 DIAGNOSIS — Z6823 Body mass index (BMI) 23.0-23.9, adult: Secondary | ICD-10-CM | POA: Diagnosis not present

## 2018-10-01 DIAGNOSIS — Z1389 Encounter for screening for other disorder: Secondary | ICD-10-CM | POA: Diagnosis not present

## 2018-10-01 DIAGNOSIS — E7849 Other hyperlipidemia: Secondary | ICD-10-CM | POA: Diagnosis not present

## 2018-11-07 DIAGNOSIS — I10 Essential (primary) hypertension: Secondary | ICD-10-CM | POA: Diagnosis not present

## 2018-11-07 DIAGNOSIS — E7849 Other hyperlipidemia: Secondary | ICD-10-CM | POA: Diagnosis not present

## 2018-11-07 DIAGNOSIS — E119 Type 2 diabetes mellitus without complications: Secondary | ICD-10-CM | POA: Diagnosis not present

## 2018-11-12 DIAGNOSIS — M810 Age-related osteoporosis without current pathological fracture: Secondary | ICD-10-CM | POA: Diagnosis not present

## 2018-11-12 DIAGNOSIS — M85832 Other specified disorders of bone density and structure, left forearm: Secondary | ICD-10-CM | POA: Diagnosis not present

## 2018-12-12 DIAGNOSIS — I10 Essential (primary) hypertension: Secondary | ICD-10-CM | POA: Diagnosis not present

## 2018-12-12 DIAGNOSIS — E119 Type 2 diabetes mellitus without complications: Secondary | ICD-10-CM | POA: Diagnosis not present

## 2018-12-12 DIAGNOSIS — E7849 Other hyperlipidemia: Secondary | ICD-10-CM | POA: Diagnosis not present

## 2018-12-19 DIAGNOSIS — H353134 Nonexudative age-related macular degeneration, bilateral, advanced atrophic with subfoveal involvement: Secondary | ICD-10-CM | POA: Diagnosis not present

## 2018-12-19 DIAGNOSIS — Z961 Presence of intraocular lens: Secondary | ICD-10-CM | POA: Diagnosis not present

## 2018-12-19 DIAGNOSIS — H26492 Other secondary cataract, left eye: Secondary | ICD-10-CM | POA: Diagnosis not present

## 2019-01-27 ENCOUNTER — Other Ambulatory Visit: Payer: Self-pay

## 2019-01-27 NOTE — Patient Outreach (Signed)
[facility]) Care Management  01/27/2019  CAILYN HOUDEK 30-Jan-1932 [id number]   Medication Adherence call to Mrs. Gus Puma Telephone call to Patient regarding Medication Adherence unable to reach patient. Mrs. Goding is showing pats due on Metformin 500 mg,Simvastatin 5 mg and Benazepril 5 mg under [place].  [facility] Management Direct Dial [phone]  Fax [phone] [email or web address]

## 2019-01-29 DIAGNOSIS — E7849 Other hyperlipidemia: Secondary | ICD-10-CM | POA: Diagnosis not present

## 2019-01-29 DIAGNOSIS — E119 Type 2 diabetes mellitus without complications: Secondary | ICD-10-CM | POA: Diagnosis not present

## 2019-01-29 DIAGNOSIS — Z6824 Body mass index (BMI) 24.0-24.9, adult: Secondary | ICD-10-CM | POA: Diagnosis not present

## 2019-01-29 DIAGNOSIS — I10 Essential (primary) hypertension: Secondary | ICD-10-CM | POA: Diagnosis not present

## 2019-04-03 ENCOUNTER — Other Ambulatory Visit: Payer: Self-pay

## 2019-04-03 ENCOUNTER — Ambulatory Visit (INDEPENDENT_AMBULATORY_CARE_PROVIDER_SITE_OTHER): Payer: Medicare Other | Admitting: Cardiology

## 2019-04-03 ENCOUNTER — Encounter: Payer: Self-pay | Admitting: Cardiology

## 2019-04-03 VITALS — BP 150/78 | HR 82 | Temp 97.2°F | Ht 64.0 in | Wt 135.0 lb

## 2019-04-03 DIAGNOSIS — I340 Nonrheumatic mitral (valve) insufficiency: Secondary | ICD-10-CM | POA: Diagnosis not present

## 2019-04-03 DIAGNOSIS — Z8679 Personal history of other diseases of the circulatory system: Secondary | ICD-10-CM

## 2019-04-03 DIAGNOSIS — I38 Endocarditis, valve unspecified: Secondary | ICD-10-CM

## 2019-04-03 NOTE — Patient Instructions (Signed)
Medication Instructions:  Your physician recommends that you continue on your current medications as directed. Please refer to the Current Medication list given to you today.  *If you need a refill on your cardiac medications before your next appointment, please call your pharmacy*  Lab Work: None today If you have labs (blood work) drawn today and your tests are completely normal, you will receive your results only by: . MyChart Message (if you have MyChart) OR . A paper copy in the mail If you have any lab test that is abnormal or we need to change your treatment, we will call you to review the results.  Testing/Procedures: Your physician has requested that you have an echocardiogram. Echocardiography is a painless test that uses sound waves to create images of your heart. It provides your doctor with information about the size and shape of your heart and how well your heart's chambers and valves are working. This procedure takes approximately one hour. There are no restrictions for this procedure.    Follow-Up: At [facility], you and your health needs are our priority.  As part of our continuing mission to provide you with exceptional heart care, we have created designated Provider Care Teams.  These Care Teams include your primary Cardiologist (physician) and Advanced Practice Providers (APPs -  Physician Assistants and Nurse Practitioners) who all work together to provide you with the care you need, when you need it.  Your next appointment:   12 months  The format for your next appointment:   In Person  Provider:   Samuel McDowell, MD  Other Instructions None     Thank you for choosing [facility] !         

## 2019-04-03 NOTE — Progress Notes (Signed)
Cardiology Office Note  Date: 04/03/2019   ID: Debra Villarreal, DOB Jan 30, 1932, MRN [id number]  PCP:  Neale Burly, MD  Cardiologist:  Rozann Lesches, MD Electrophysiologist:  None   Chief Complaint  Patient presents with  . Cardiac follow-up    History of Present Illness: Debra Villarreal is an 85 y.o. female last seen in December 2018.  She presents to the office for a follow-up visit.  She tells me that she has been doing well overall, did have an episode of chest pain several weeks ago that occurred at night when she was upset, was worried about one of her children.  She felt a "tingling" sensation in her chest and took a nitroglycerin.  She has had no recurrences.  Last echocardiogram was in October 2018 at which point LVEF was 55 to 60% and she had moderate mitral and tricuspid regurgitation.  She has not had a follow-up study since that time.  She still sees Dr. Sherrie Sport for primary care.  I reviewed her medications which are listed below.  She reports compliance.  I personally reviewed her ECG today which shows sinus rhythm with low voltage in the precordial leads.  Past Medical History:  Diagnosis Date  . Anxiety   . Arthritis   . Bilateral cataracts   . Diabetes mellitus without complication ([place])   . Hypertension   . Takotsubo cardiomyopathy 2007   Normary coronaries 2007, initial EF 18% improved to 60%   . Uterine cancer [facility])     Past Surgical History:  Procedure Laterality Date  . ABDOMINAL HYSTERECTOMY     1966  . APPENDECTOMY    . CATARACT EXTRACTION W/PHACO  10/12/2011   Procedure: CATARACT EXTRACTION PHACO AND INTRAOCULAR LENS PLACEMENT (IOC);  Surgeon: Tonny Branch, MD;  Location: [facility] ORS;  Service: Ophthalmology;  Laterality: Left;  CDE:18.57  . CATARACT EXTRACTION W/PHACO  10/23/2011   Procedure: CATARACT EXTRACTION PHACO AND INTRAOCULAR LENS PLACEMENT (IOC);  Surgeon: Tonny Branch, MD;  Location: [facility] ORS;  Service: Ophthalmology;  Laterality: Right;   CDE 18.09  . COLONOSCOPY  01/31/2012   Procedure: COLONOSCOPY;  Surgeon: Daneil Dolin, MD;  Location: [facility] ENDO SUITE;  Service: Endoscopy;  Laterality: N/A;  1:45  . LEFT HEART CATHETERIZATION WITH CORONARY ANGIOGRAM N/A 02/09/2012   Procedure: LEFT HEART CATHETERIZATION WITH CORONARY ANGIOGRAM;  Surgeon: Thayer Headings, MD;  Location: [facility] CATH LAB;  Service: Cardiovascular;  Laterality: N/A;    Current Outpatient Medications  Medication Sig Dispense Refill  . acetaminophen (TYLENOL) 325 MG tablet Take 325 mg by mouth every 6 (six) hours as needed for mild pain or moderate pain.     Marland Kitchen aspirin EC 81 MG tablet Take 81 mg by mouth daily.    . busPIRone (BUSPAR) 5 MG tablet Take 1 tablet (5 mg total) by mouth 2 (two) times daily. 60 tablet 11  . Calcium Citrate-Vitamin D (CALCIUM + D PO) Take 1 tablet by mouth 2 (two) times daily.    Marland Kitchen escitalopram (LEXAPRO) 5 MG tablet Take 1 tablet (5 mg total) by mouth at bedtime. 30 tablet 11  . Flaxseed, Linseed, (FLAX SEED OIL) 1000 MG CAPS Take 1 capsule by mouth daily.    . meloxicam (MOBIC) 15 MG tablet Take 1 tablet (15 mg total) by mouth daily. 30 tablet 4  . metFORMIN (GLUCOPHAGE) 500 MG tablet Take 500 mg by mouth at bedtime.    . nitroGLYCERIN (NITROSTAT) 0.4 MG SL tablet Place 1 tablet (0.4  mg total) under the tongue every 5 (five) minutes as needed. Up to 3 doses. If no relief after 3rd dose, proceed to ED for evaluation 25 tablet 2  . Omega-3 Fatty Acids (FISH OIL PO) Take 1 capsule by mouth daily with breakfast.    . simvastatin (ZOCOR) 5 MG tablet Take 5 mg by mouth daily.    . VESICARE 5 MG tablet TAKE ONE TABLET BY MOUTH ONCE DAILY 90 tablet 0   No current facility-administered medications for this visit.    Allergies:  Patient has no known allergies.   Social History: The patient  reports that she has never smoked. She has never used smokeless tobacco. She reports that she does not drink alcohol or use drugs.   ROS:  Please see the  history of present illness. Otherwise, complete review of systems is positive for hearing loss.  All other systems are reviewed and negative.   Physical Exam: VS:  BP (!) 150/78   Pulse 82   Temp (!) 97.2 F (36.2 C)   Ht 5\' 4"  (1.626 m)   Wt 135 lb (61.2 kg)   SpO2 94%   BMI 23.17 kg/m , BMI Body mass index is 23.17 kg/m.  Wt Readings from Last 3 Encounters:  04/03/19 135 lb (61.2 kg)  05/18/17 125 lb (56.7 kg)  03/06/17 116 lb (52.6 kg)    General: Elderly woman, appears comfortable at rest. HEENT: Conjunctiva and lids normal, wearing a mask. Neck: Supple, no elevated JVP or carotid bruits, no thyromegaly. Lungs: Clear to auscultation, nonlabored breathing at rest. Cardiac: Regular rate and rhythm, no S3 or significant systolic murmur, no pericardial rub. Abdomen: Soft, nontender, bowel sounds present. Extremities: Trace ankle edema, distal pulses 2+. Skin: Warm and dry. Musculoskeletal: No kyphosis. Neuropsychiatric: Alert and oriented x3, affect grossly appropriate.  ECG:  An ECG dated 03/06/2017 was personally reviewed today and demonstrated:  Sinus tachycardia.  Recent Labwork:  No interval lab work for review.  Other Studies Reviewed Today:  Echocardiogram 03/13/2017: Study Conclusions  - Left ventricle: The cavity size was normal. Wall thickness was   normal. Systolic function was normal. The estimated ejection   fraction was in the range of 55% to 60%. Wall motion was normal;   there were no regional wall motion abnormalities. Doppler   parameters are consistent with abnormal left ventricular   relaxation (grade 1 diastolic dysfunction). - Mitral valve: There was moderate regurgitation. - Right atrium: The atrium was mildly dilated. - Tricuspid valve: There was moderate regurgitation. - Pulmonary arteries: PA peak pressure: 32 mm Hg (S).  Assessment and Plan:  1.  Recent episode of atypical chest pain.  ECG reviewed and stable.  She has a history of  normal coronary arteries and previous stress-induced cardiomyopathy.  We will plan a follow-up echocardiogram to ensure stability in LVEF.  2.  Moderate mitral regurgitation and tricuspid regurgitation by evaluation in 2018.  Follow-up echocardiogram will be obtained.  Medication Adjustments/Labs and Tests Ordered: Current medicines are reviewed at length with the patient today.  Concerns regarding medicines are outlined above.   Tests Ordered: Orders Placed This Encounter  Procedures  . EKG 12-Lead  . ECHOCARDIOGRAM COMPLETE    Medication Changes: No orders of the defined types were placed in this encounter.   Disposition:  Follow up 1 year in the [place] office.  Signed, Satira Sark, MD, [facility] 04/03/2019 1:23 PM    [facility] at [facility] [address],  [place], [place] [address] Phone: [phone]; Fax: [phone]

## 2019-04-16 ENCOUNTER — Ambulatory Visit (HOSPITAL_COMMUNITY): Payer: Medicare Other | Attending: Cardiology

## 2019-04-22 ENCOUNTER — Telehealth: Payer: Self-pay

## 2019-04-22 DIAGNOSIS — I38 Endocarditis, valve unspecified: Secondary | ICD-10-CM

## 2019-04-22 NOTE — Telephone Encounter (Signed)
New order placed

## 2019-04-22 NOTE — Telephone Encounter (Signed)
New message    Patient is ready to schedule echo for December, needs new order put in

## 2019-04-24 DIAGNOSIS — E7849 Other hyperlipidemia: Secondary | ICD-10-CM | POA: Diagnosis not present

## 2019-04-24 DIAGNOSIS — I10 Essential (primary) hypertension: Secondary | ICD-10-CM | POA: Diagnosis not present

## 2019-04-24 DIAGNOSIS — E119 Type 2 diabetes mellitus without complications: Secondary | ICD-10-CM | POA: Diagnosis not present

## 2019-05-06 DIAGNOSIS — E7849 Other hyperlipidemia: Secondary | ICD-10-CM | POA: Diagnosis not present

## 2019-05-06 DIAGNOSIS — Z6824 Body mass index (BMI) 24.0-24.9, adult: Secondary | ICD-10-CM | POA: Diagnosis not present

## 2019-05-06 DIAGNOSIS — I10 Essential (primary) hypertension: Secondary | ICD-10-CM | POA: Diagnosis not present

## 2019-05-06 DIAGNOSIS — E119 Type 2 diabetes mellitus without complications: Secondary | ICD-10-CM | POA: Diagnosis not present

## 2019-05-14 ENCOUNTER — Ambulatory Visit (HOSPITAL_COMMUNITY)
Admission: RE | Admit: 2019-05-14 | Discharge: 2019-05-14 | Disposition: A | Discharge status: Home / Self Care | Payer: Medicare Other | Source: Ambulatory Visit | Attending: Cardiology | Admitting: Cardiology

## 2019-05-14 ENCOUNTER — Other Ambulatory Visit: Payer: Self-pay

## 2019-05-14 DIAGNOSIS — I340 Nonrheumatic mitral (valve) insufficiency: Secondary | ICD-10-CM | POA: Diagnosis not present

## 2019-05-14 NOTE — Progress Notes (Signed)
*  PRELIMINARY RESULTS* Echocardiogram 2D Echocardiogram has been performed.  Debra Villarreal 05/14/2019, 3:49 PM

## 2019-05-23 DIAGNOSIS — E7849 Other hyperlipidemia: Secondary | ICD-10-CM | POA: Diagnosis not present

## 2019-05-23 DIAGNOSIS — E119 Type 2 diabetes mellitus without complications: Secondary | ICD-10-CM | POA: Diagnosis not present

## 2019-05-23 DIAGNOSIS — I10 Essential (primary) hypertension: Secondary | ICD-10-CM | POA: Diagnosis not present

## 2019-05-26 ENCOUNTER — Telehealth: Payer: Self-pay | Admitting: Cardiology

## 2019-05-26 NOTE — Telephone Encounter (Signed)
Asking about clinic the doctor wants her to go to

## 2019-05-26 NOTE — Telephone Encounter (Signed)
Returned pt's daughters call. Informed her that she was only asked to have repeat echo (which was normal) and follow up with Dr. Domenic Polite in one year, per LOV. She voiced understanding of plan and thanked me for returning call.

## 2019-07-05 DIAGNOSIS — W19XXXA Unspecified fall, initial encounter: Secondary | ICD-10-CM | POA: Diagnosis not present

## 2019-07-05 DIAGNOSIS — F419 Anxiety disorder, unspecified: Secondary | ICD-10-CM | POA: Diagnosis not present

## 2019-07-05 DIAGNOSIS — I10 Essential (primary) hypertension: Secondary | ICD-10-CM | POA: Diagnosis not present

## 2019-07-05 DIAGNOSIS — F4320 Adjustment disorder, unspecified: Secondary | ICD-10-CM | POA: Diagnosis not present

## 2019-07-05 DIAGNOSIS — R404 Transient alteration of awareness: Secondary | ICD-10-CM | POA: Diagnosis not present

## 2019-07-05 DIAGNOSIS — R69 Illness, unspecified: Secondary | ICD-10-CM | POA: Diagnosis not present

## 2019-07-05 DIAGNOSIS — R55 Syncope and collapse: Secondary | ICD-10-CM | POA: Diagnosis not present

## 2019-07-05 DIAGNOSIS — Z743 Need for continuous supervision: Secondary | ICD-10-CM | POA: Diagnosis not present

## 2019-07-05 DIAGNOSIS — Z79899 Other long term (current) drug therapy: Secondary | ICD-10-CM | POA: Diagnosis not present

## 2019-07-05 DIAGNOSIS — Z7982 Long term (current) use of aspirin: Secondary | ICD-10-CM | POA: Diagnosis not present

## 2019-07-05 DIAGNOSIS — Z7984 Long term (current) use of oral hypoglycemic drugs: Secondary | ICD-10-CM | POA: Diagnosis not present

## 2019-07-05 DIAGNOSIS — E119 Type 2 diabetes mellitus without complications: Secondary | ICD-10-CM | POA: Diagnosis not present

## 2019-07-14 DIAGNOSIS — E7849 Other hyperlipidemia: Secondary | ICD-10-CM | POA: Diagnosis not present

## 2019-07-14 DIAGNOSIS — Z1389 Encounter for screening for other disorder: Secondary | ICD-10-CM | POA: Diagnosis not present

## 2019-07-14 DIAGNOSIS — Z0000 Encounter for general adult medical examination without abnormal findings: Secondary | ICD-10-CM | POA: Diagnosis not present

## 2019-07-14 DIAGNOSIS — I10 Essential (primary) hypertension: Secondary | ICD-10-CM | POA: Diagnosis not present

## 2019-07-14 DIAGNOSIS — E119 Type 2 diabetes mellitus without complications: Secondary | ICD-10-CM | POA: Diagnosis not present

## 2019-07-30 DIAGNOSIS — I10 Essential (primary) hypertension: Secondary | ICD-10-CM | POA: Diagnosis not present

## 2019-07-30 DIAGNOSIS — E119 Type 2 diabetes mellitus without complications: Secondary | ICD-10-CM | POA: Diagnosis not present

## 2019-07-30 DIAGNOSIS — E7849 Other hyperlipidemia: Secondary | ICD-10-CM | POA: Diagnosis not present

## 2019-08-25 DIAGNOSIS — I10 Essential (primary) hypertension: Secondary | ICD-10-CM | POA: Diagnosis not present

## 2019-08-25 DIAGNOSIS — E7849 Other hyperlipidemia: Secondary | ICD-10-CM | POA: Diagnosis not present

## 2019-08-25 DIAGNOSIS — E119 Type 2 diabetes mellitus without complications: Secondary | ICD-10-CM | POA: Diagnosis not present

## 2019-09-08 DIAGNOSIS — H353134 Nonexudative age-related macular degeneration, bilateral, advanced atrophic with subfoveal involvement: Secondary | ICD-10-CM | POA: Diagnosis not present

## 2019-09-08 DIAGNOSIS — H16223 Keratoconjunctivitis sicca, not specified as Sjogren's, bilateral: Secondary | ICD-10-CM | POA: Diagnosis not present

## 2019-09-08 DIAGNOSIS — H04123 Dry eye syndrome of bilateral lacrimal glands: Secondary | ICD-10-CM | POA: Diagnosis not present

## 2019-09-08 DIAGNOSIS — Z961 Presence of intraocular lens: Secondary | ICD-10-CM | POA: Diagnosis not present

## 2019-09-09 DIAGNOSIS — M7071 Other bursitis of hip, right hip: Secondary | ICD-10-CM | POA: Diagnosis not present

## 2019-09-09 DIAGNOSIS — M25551 Pain in right hip: Secondary | ICD-10-CM | POA: Diagnosis not present

## 2019-09-09 DIAGNOSIS — L03818 Cellulitis of other sites: Secondary | ICD-10-CM | POA: Diagnosis not present

## 2019-09-18 ENCOUNTER — Inpatient Hospital Stay
Admission: AD | Admit: 2019-09-18 | Payer: Medicare Other | Source: Other Acute Inpatient Hospital | Admitting: Cardiology

## 2019-09-18 DIAGNOSIS — Z79899 Other long term (current) drug therapy: Secondary | ICD-10-CM | POA: Diagnosis not present

## 2019-09-18 DIAGNOSIS — N179 Acute kidney failure, unspecified: Secondary | ICD-10-CM | POA: Diagnosis not present

## 2019-09-18 DIAGNOSIS — R079 Chest pain, unspecified: Secondary | ICD-10-CM | POA: Diagnosis not present

## 2019-09-18 DIAGNOSIS — Z96641 Presence of right artificial hip joint: Secondary | ICD-10-CM | POA: Diagnosis not present

## 2019-09-18 DIAGNOSIS — E119 Type 2 diabetes mellitus without complications: Secondary | ICD-10-CM | POA: Diagnosis not present

## 2019-09-18 DIAGNOSIS — Z20822 Contact with and (suspected) exposure to covid-19: Secondary | ICD-10-CM | POA: Diagnosis not present

## 2019-09-18 DIAGNOSIS — Z7984 Long term (current) use of oral hypoglycemic drugs: Secondary | ICD-10-CM | POA: Diagnosis not present

## 2019-09-18 DIAGNOSIS — Z7982 Long term (current) use of aspirin: Secondary | ICD-10-CM | POA: Diagnosis not present

## 2019-09-18 DIAGNOSIS — M1990 Unspecified osteoarthritis, unspecified site: Secondary | ICD-10-CM | POA: Diagnosis not present

## 2019-09-18 DIAGNOSIS — I214 Non-ST elevation (NSTEMI) myocardial infarction: Secondary | ICD-10-CM | POA: Diagnosis not present

## 2019-09-19 DIAGNOSIS — I272 Pulmonary hypertension, unspecified: Secondary | ICD-10-CM | POA: Diagnosis not present

## 2019-09-19 DIAGNOSIS — I10 Essential (primary) hypertension: Secondary | ICD-10-CM | POA: Diagnosis not present

## 2019-09-19 DIAGNOSIS — E119 Type 2 diabetes mellitus without complications: Secondary | ICD-10-CM | POA: Diagnosis not present

## 2019-09-19 DIAGNOSIS — Z20822 Contact with and (suspected) exposure to covid-19: Secondary | ICD-10-CM | POA: Diagnosis not present

## 2019-09-19 DIAGNOSIS — Z602 Problems related to living alone: Secondary | ICD-10-CM | POA: Diagnosis not present

## 2019-09-19 DIAGNOSIS — N179 Acute kidney failure, unspecified: Secondary | ICD-10-CM | POA: Diagnosis not present

## 2019-09-19 DIAGNOSIS — R778 Other specified abnormalities of plasma proteins: Secondary | ICD-10-CM | POA: Diagnosis not present

## 2019-09-19 DIAGNOSIS — Z79899 Other long term (current) drug therapy: Secondary | ICD-10-CM | POA: Diagnosis not present

## 2019-09-19 DIAGNOSIS — R55 Syncope and collapse: Secondary | ICD-10-CM | POA: Diagnosis not present

## 2019-09-19 DIAGNOSIS — Z7984 Long term (current) use of oral hypoglycemic drugs: Secondary | ICD-10-CM | POA: Diagnosis not present

## 2019-09-19 DIAGNOSIS — M1990 Unspecified osteoarthritis, unspecified site: Secondary | ICD-10-CM | POA: Diagnosis not present

## 2019-09-19 DIAGNOSIS — Z7982 Long term (current) use of aspirin: Secondary | ICD-10-CM | POA: Diagnosis not present

## 2019-09-19 DIAGNOSIS — I5181 Takotsubo syndrome: Secondary | ICD-10-CM | POA: Diagnosis not present

## 2019-09-19 DIAGNOSIS — I214 Non-ST elevation (NSTEMI) myocardial infarction: Secondary | ICD-10-CM | POA: Diagnosis not present

## 2019-09-19 DIAGNOSIS — I517 Cardiomegaly: Secondary | ICD-10-CM | POA: Diagnosis not present

## 2019-09-19 DIAGNOSIS — I499 Cardiac arrhythmia, unspecified: Secondary | ICD-10-CM | POA: Diagnosis not present

## 2019-09-19 DIAGNOSIS — I071 Rheumatic tricuspid insufficiency: Secondary | ICD-10-CM | POA: Diagnosis not present

## 2019-09-19 DIAGNOSIS — R079 Chest pain, unspecified: Secondary | ICD-10-CM | POA: Diagnosis not present

## 2019-09-19 DIAGNOSIS — Z96641 Presence of right artificial hip joint: Secondary | ICD-10-CM | POA: Diagnosis not present

## 2019-09-19 DIAGNOSIS — E785 Hyperlipidemia, unspecified: Secondary | ICD-10-CM | POA: Diagnosis not present

## 2019-09-23 DIAGNOSIS — R7689 Other specified abnormal immunological findings in serum: Secondary | ICD-10-CM | POA: Insufficient documentation

## 2019-09-23 DIAGNOSIS — E1165 Type 2 diabetes mellitus with hyperglycemia: Secondary | ICD-10-CM | POA: Diagnosis not present

## 2019-09-23 DIAGNOSIS — R768 Other specified abnormal immunological findings in serum: Secondary | ICD-10-CM | POA: Insufficient documentation

## 2019-09-23 DIAGNOSIS — E7849 Other hyperlipidemia: Secondary | ICD-10-CM | POA: Diagnosis not present

## 2019-09-23 DIAGNOSIS — I10 Essential (primary) hypertension: Secondary | ICD-10-CM | POA: Diagnosis not present

## 2019-10-10 ENCOUNTER — Other Ambulatory Visit: Payer: Self-pay

## 2019-10-10 ENCOUNTER — Encounter: Payer: Self-pay | Admitting: Cardiology

## 2019-10-10 ENCOUNTER — Ambulatory Visit (INDEPENDENT_AMBULATORY_CARE_PROVIDER_SITE_OTHER): Payer: Medicare Other | Admitting: Cardiology

## 2019-10-10 VITALS — BP 120/82 | HR 78 | Temp 98.3°F | Wt 136.0 lb

## 2019-10-10 DIAGNOSIS — I5181 Takotsubo syndrome: Secondary | ICD-10-CM | POA: Diagnosis not present

## 2019-10-10 DIAGNOSIS — I38 Endocarditis, valve unspecified: Secondary | ICD-10-CM | POA: Diagnosis not present

## 2019-10-10 DIAGNOSIS — E782 Mixed hyperlipidemia: Secondary | ICD-10-CM

## 2019-10-10 MED ORDER — BISOPROLOL FUMARATE 5 MG PO TABS: 2.5000 mg | ORAL_TABLET | Freq: Every day | ORAL | 3 refills | Status: DC

## 2019-10-10 NOTE — Patient Instructions (Signed)
Medication Instructions: START Bisoprolol 2.5 mg daily    Labwork: None today  Procedures/Testing: Your physician has requested that you have an echocardiogram in 2 months. Echocardiography is a painless test that uses sound waves to create images of your heart. It provides your doctor with information about the size and shape of your heart and how well your heart's chambers and valves are working. This procedure takes approximately one hour. There are no restrictions for this procedure.    Follow-Up: 2 months office visit with Bernerd Pho, PA-C  Any Additional Special Instructions Will Be Listed Below (If Applicable).     If you need a refill on your cardiac medications before your next appointment, please call your pharmacy.         Thank you for choosing [facility] !

## 2019-10-10 NOTE — Progress Notes (Signed)
Cardiology Office Note  Date: 10/10/2019   ID: Debra Villarreal, DOB Sep 17, 1931, MRN [id number]  PCP:  Neale Burly, MD  Cardiologist:  Rozann Lesches, MD Electrophysiologist:  None   Chief Complaint  Patient presents with  . Hospitalization Follow-up    History of Present Illness: Debra Villarreal is an 86 y.o. female last seen in October 2020.  I reviewed interval records.  She was hospitalized at [facility] earlier in April after an episode of chest pain and syncope that occurred while she was at her sister's funeral.  She was found to have a minimally elevated troponin level and underwent cardiac catheterization on April 9 which did not demonstrate any significant CAD.  Echocardiography was consistent with Tako-tsubo cardiomyopathy which in fact she has had in the past as well.  She was discharged on metoprolol and lisinopril as well as statin therapy.  She is here today with one of her daughters for follow-up.  She does not report any chest pain or progressive breathlessness at this time.  In reviewing her current medications I see that she is not on Lopressor, she states that she did not get this filled.  She is taking Zestril and Zocor.  She does not report any palpitations or syncope.  Past Medical History:  Diagnosis Date  . Anxiety   . Arthritis   . Bilateral cataracts   . Hypertension   . Takotsubo cardiomyopathy 2007   Normary coronaries 2007, initial EF 18% improved to 60%   . Type 2 diabetes mellitus ([place])   . Uterine cancer [facility])     Past Surgical History:  Procedure Laterality Date  . ABDOMINAL HYSTERECTOMY     1966  . APPENDECTOMY    . CATARACT EXTRACTION W/PHACO  10/12/2011   Procedure: CATARACT EXTRACTION PHACO AND INTRAOCULAR LENS PLACEMENT (IOC);  Surgeon: Tonny Branch, MD;  Location: [facility] OR[facility];  Service: Ophthalmology;  Laterality: Left;  CDE:18.57  . CATARACT EXTRACTION W/PHACO  10/23/2011   Procedure: CATARACT EXTRACTION PHACO AND INTRAOCULAR LENS PLACEMENT  (IOC);  Surgeon: Tonny Branch, MD;  Location: [facility] OR[facility];  Service: Ophthalmology;  Laterality: Right;  CDE 18.09  . COLONOSCOPY  01/31/2012   Procedure: COLONOSCOPY;  Surgeon: Daneil Dolin, MD;  Location: [facility] ENDO SUITE;  Service: Endoscopy;  Laterality: N/A;  1:45  . LEFT HEART CATHETERIZATION WITH CORONARY ANGIOGRAM N/A 02/09/2012   Procedure: LEFT HEART CATHETERIZATION WITH CORONARY ANGIOGRAM;  Surgeon: Thayer Headings, MD;  Location: [facility] CATH LAB;  Service: Cardiovascular;  Laterality: N/A;    Current Outpatient Medications  Medication Sig Dispense Refill  . acetaminophen (TYLENOL) 325 MG tablet Take 325 mg by mouth every 6 (six) hours as needed for mild pain or moderate pain.     Marland Kitchen aspirin EC 81 MG tablet Take 81 mg by mouth daily.    . busPIRone (BUSPAR) 5 MG tablet Take 1 tablet (5 mg total) by mouth 2 (two) times daily. 60 tablet 11  . Calcium Citrate-Vitamin D (CALCIUM + D PO) Take 1 tablet by mouth 2 (two) times daily.    Marland Kitchen escitalopram (LEXAPRO) 5 MG tablet Take 1 tablet (5 mg total) by mouth at bedtime. 30 tablet 11  . Flaxseed, Linseed, (FLAX SEED OIL) 1000 MG CAPS Take 1 capsule by mouth daily.    Marland Kitchen lisinopril (ZESTRIL) 2.5 MG tablet Take 2.5 mg by mouth daily.    . meloxicam (MOBIC) 15 MG tablet Take 1 tablet (15 mg total) by mouth daily. 30 tablet 4  .  metFORMIN (GLUCOPHAGE) 500 MG tablet Take 500 mg by mouth at bedtime.    . nitroGLYCERIN (NITROSTAT) 0.4 MG SL tablet Place 1 tablet (0.4 mg total) under the tongue every 5 (five) minutes as needed. Up to 3 doses. If no relief after 3rd dose, proceed to ED for evaluation 25 tablet 2  . Omega-3 Fatty Acids (FISH OIL PO) Take 1 capsule by mouth daily with breakfast.    . simvastatin (ZOCOR) 5 MG tablet Take 5 mg by mouth daily.    . VESICARE 5 MG tablet TAKE ONE TABLET BY MOUTH ONCE DAILY 90 tablet 0  . bisoprolol (ZEBETA) 5 MG tablet Take 0.5 tablets (2.5 mg total) by mouth daily. 45 tablet 3   No current facility-administered  medications for this visit.   Allergies:  Patient has no known allergies.   ROS:  Hearing loss.  Physical Exam: VS:  BP 120/82   Pulse 78   Temp 98.3 F (36.8 C)   Wt 136 lb (61.7 kg)   SpO2 96%   BMI 23.34 kg/m , BMI Body mass index is 23.34 kg/m.  Wt Readings from Last 3 Encounters:  10/10/19 136 lb (61.7 kg)  04/03/19 135 lb (61.2 kg)  05/18/17 125 lb (56.7 kg)    General: Elderly woman, appears comfortable at rest. HEENT: Conjunctiva and lids normal, wearing a mask. Neck: Supple, no elevated JVP or carotid bruits, no thyromegaly. Lungs: Clear to auscultation, nonlabored breathing at rest. Cardiac: Regular rate and rhythm, no S3 or significant systolic murmur, no pericardial rub. Abdomen: Soft, bowel sounds present. Extremities: No pitting edema, distal pulses 2+.  Right radial arteriotomy site stable.  ECG:  An ECG dated 04/03/2019 was personally reviewed today and demonstrated:  Sinus rhythm with small R' in lead V1 and V2.  Recent Labwork:  April 2021: Hemoglobin 12.4, BUN 25, creatinine 0.94, potassium 4.1, AST 32, ALT 8  Other Studies Reviewed Today:  Cardiac catheterization 09/19/2019 [facility]): FINDINGS Hemodynamics and Left Heart Catheterization  Aortic pressure: 113/71 mm Hg (mean 85 mm Hg)  Left ventricular filling pressure not obtained Left Ventriculogram  RAO Left Ventriculogram:Not Performed Coronary Angiography Dominance: Right Left Main: The left main coronary artery (LMCA) is a large-caliber vessel that originates from the left coronary sinus. It bifurcates into the left anterior descending (LAD) and left circumflex (LCx) arteries. There is no angiographic evidence of significant disease in the LMCA. LAD: The LAD is a large-caliber vessel that gives off 3 small-caliber diagonal (D) branches before it wraps around the apex. There is no angiographic evidence of significant disease in the LAD. Left Circumflex: The LCx is a large-caliber vessel that gives off 2  obtuse marginal (OM) branches and then continues as a small vessel in the AV groove. OM1 is a large-caliber vessel. OM2 is a small-caliber vessel. There is no angiographic evidence of significant disease in the LCx. Right Coronary: The right coronary artery (RCA) is a large-caliber vessel originating from the right coronary sinus. It bifurcates distally into the posterior descending artery (PDA) and a posterolateral (PL) branch consistent with a right dominant system. There is no angiographic evidence of significant disease in the RCA. Complications: None Blood loss: Minimal Specimen: None Device/Implants: N/A Pre-procedure Dx: Chest pain Post-procedure Dx: Takutsubo I personally spent 12 minutes continuously monitoring the patient during the administration of moderate sedation. Pre and post sedation activities have been reviewed.   Echocardiogram 09/20/2019 [facility]): Summary  1. The left ventricular systolic function is severely decreased, LVEF is  visually  estimated at 25-30%.  2. There is decreased contractile function involving the apical anterior,  apical lateral, apical inferior, mid inferolateral, mid inferior and mid  anteroseptal segment(s).  3. There is grade I diastolic dysfunction (impaired relaxation).  4. The left atrium is mildly dilated in size.  5. The right ventricle is upper normal in size, with normal systolic  function.  6. There is moderate tricuspid regurgitation.  7. There is moderate pulmonary hypertension, estimated pulmonary artery  systolic pressure is 47 mmHg.  8. Agitated saline study is negative.  9. No evidence of an interatrial communication or intrapulmonary shunt.   Assessment and Plan:  1.  Stress-induced cardiomyopathy as discussed above.  LVEF 25 to 30% by echocardiogram at [facility], no significant coronary disease by concurrent cardiac catheterization.  Would anticipate normalization of LVEF over time.  Continue lisinopril,  starting bisoprolol 2.5 mg daily.  She is clinically stable at this time.  Follow-up echocardiogram in 2 months with office visit.  2.  Valvular heart disease including moderate mitral and tricuspid regurgitation.  This will also be reevaluated by follow-up echocardiography noted above..  Medication Adjustments/Labs and Tests Ordered: Current medicines are reviewed at length with the patient today.  Concerns regarding medicines are outlined above.   Tests Ordered: Orders Placed This Encounter  Procedures  . ECHOCARDIOGRAM COMPLETE    Medication Changes: Meds ordered this encounter  Medications  . bisoprolol (ZEBETA) 5 MG tablet    Sig: Take 0.5 tablets (2.5 mg total) by mouth daily.    Dispense:  45 tablet    Refill:  3    Disposition:  Follow up 2 months with [place] in the [place] office.  Signed, Satira Sark, MD, [facility] 10/10/2019 10:21 AM    [facility]. [address] [place], [place] [address] Phone: [phone]; Fax: [phone]

## 2019-10-20 DIAGNOSIS — E7849 Other hyperlipidemia: Secondary | ICD-10-CM | POA: Diagnosis not present

## 2019-10-20 DIAGNOSIS — I10 Essential (primary) hypertension: Secondary | ICD-10-CM | POA: Diagnosis not present

## 2019-10-20 DIAGNOSIS — E1165 Type 2 diabetes mellitus with hyperglycemia: Secondary | ICD-10-CM | POA: Diagnosis not present

## 2019-11-17 DIAGNOSIS — E7849 Other hyperlipidemia: Secondary | ICD-10-CM | POA: Diagnosis not present

## 2019-11-17 DIAGNOSIS — E1165 Type 2 diabetes mellitus with hyperglycemia: Secondary | ICD-10-CM | POA: Diagnosis not present

## 2019-11-17 DIAGNOSIS — I10 Essential (primary) hypertension: Secondary | ICD-10-CM | POA: Diagnosis not present

## 2019-11-25 DIAGNOSIS — Z0000 Encounter for general adult medical examination without abnormal findings: Secondary | ICD-10-CM | POA: Diagnosis not present

## 2019-11-25 DIAGNOSIS — I10 Essential (primary) hypertension: Secondary | ICD-10-CM | POA: Diagnosis not present

## 2019-11-25 DIAGNOSIS — E119 Type 2 diabetes mellitus without complications: Secondary | ICD-10-CM | POA: Diagnosis not present

## 2019-11-25 DIAGNOSIS — M542 Cervicalgia: Secondary | ICD-10-CM | POA: Diagnosis not present

## 2019-11-25 DIAGNOSIS — I209 Angina pectoris, unspecified: Secondary | ICD-10-CM | POA: Diagnosis not present

## 2019-12-10 ENCOUNTER — Ambulatory Visit (HOSPITAL_COMMUNITY): Admission: RE | Admit: 2019-12-10 | Payer: Medicare Other | Source: Ambulatory Visit

## 2019-12-10 ENCOUNTER — Ambulatory Visit: Payer: Medicare Other | Admitting: Student

## 2019-12-19 DIAGNOSIS — E119 Type 2 diabetes mellitus without complications: Secondary | ICD-10-CM | POA: Diagnosis not present

## 2019-12-19 DIAGNOSIS — I10 Essential (primary) hypertension: Secondary | ICD-10-CM | POA: Diagnosis not present

## 2020-01-22 ENCOUNTER — Ambulatory Visit: Payer: Medicare Other | Admitting: Student

## 2020-01-22 DIAGNOSIS — E119 Type 2 diabetes mellitus without complications: Secondary | ICD-10-CM | POA: Diagnosis not present

## 2020-01-22 DIAGNOSIS — I10 Essential (primary) hypertension: Secondary | ICD-10-CM | POA: Diagnosis not present

## 2020-01-22 NOTE — Progress Notes (Deleted)
Cardiology Office Note    Date:  01/22/2020   ID:  LYSBETH DICOLA, DOB 11/06/1931, MRN [id number]  PCP:  Neale Burly, MD  Cardiologist: Rozann Lesches, MD    No chief complaint on file.   History of Present Illness:    Debra Villarreal is a 86 y.o. female with past medical history of takotsubo cardiomyopathy (occurring in 2007 with EF < 20% and cath showing normal cors, repeat episode in 09/2019 with EF at 25-30% and cath with normal cors), HTN, HLD and Type 2 DM who presents to the office today for 50-month follow-up.   She was last examined by Dr. Domenic Polite in 09/2019 following her recent hospitalization at [facility] for recurrent takotsubo cardiomyopathy. Denied any chest pain or progressive dyspnea on exertion at that time. She had previously self-discontinued Lopressor and was started on Bisoprolol 2.5mg  daily while being continued on Lisinopril. Was recommended to have a repeat echocardiogram in 2 months but this was not performed.     Past Medical History:  Diagnosis Date  . Anxiety   . Arthritis   . Bilateral cataracts   . Hypertension   . Takotsubo cardiomyopathy 2007   Normary coronaries 2007, initial EF 18% improved to 60%   . Type 2 diabetes mellitus ([place])   . Uterine cancer [facility])     Past Surgical History:  Procedure Laterality Date  . ABDOMINAL HYSTERECTOMY     1966  . APPENDECTOMY    . CATARACT EXTRACTION W/PHACO  10/12/2011   Procedure: CATARACT EXTRACTION PHACO AND INTRAOCULAR LENS PLACEMENT (IOC);  Surgeon: Tonny Branch, MD;  Location: [facility];  Service: Ophthalmology;  Laterality: Left;  CDE:18.57  . CATARACT EXTRACTION W/PHACO  10/23/2011   Procedure: CATARACT EXTRACTION PHACO AND INTRAOCULAR LENS PLACEMENT (IOC);  Surgeon: Tonny Branch, MD;  Location: [facility];  Service: Ophthalmology;  Laterality: Right;  CDE 18.09  . COLONOSCOPY  01/31/2012   Procedure: COLONOSCOPY;  Surgeon: Daneil Dolin, MD;  Location: [facility];  Service: Endoscopy;  Laterality: N/A;   1:45  . LEFT HEART CATHETERIZATION WITH CORONARY ANGIOGRAM N/A 02/09/2012   Procedure: LEFT HEART CATHETERIZATION WITH CORONARY ANGIOGRAM;  Surgeon: Thayer Headings, MD;  Location: [facility] CATH LAB;  Service: Cardiovascular;  Laterality: N/A;    Current Medications: Outpatient Medications Prior to Visit  Medication Sig Dispense Refill  . acetaminophen (TYLENOL) 325 MG tablet Take 325 mg by mouth every 6 (six) hours as needed for mild pain or moderate pain.     Marland Kitchen aspirin EC 81 MG tablet Take 81 mg by mouth daily.    . bisoprolol (ZEBETA) 5 MG tablet Take 0.5 tablets (2.5 mg total) by mouth daily. 45 tablet 3  . busPIRone (BUSPAR) 5 MG tablet Take 1 tablet (5 mg total) by mouth 2 (two) times daily. 60 tablet 11  . Calcium Citrate-Vitamin D (CALCIUM + D PO) Take 1 tablet by mouth 2 (two) times daily.    Marland Kitchen escitalopram (LEXAPRO) 5 MG tablet Take 1 tablet (5 mg total) by mouth at bedtime. 30 tablet 11  . Flaxseed, Linseed, (FLAX SEED OIL) 1000 MG CAPS Take 1 capsule by mouth daily.    Marland Kitchen lisinopril (ZESTRIL) 2.5 MG tablet Take 2.5 mg by mouth daily.    . meloxicam (MOBIC) 15 MG tablet Take 1 tablet (15 mg total) by mouth daily. 30 tablet 4  . metFORMIN (GLUCOPHAGE) 500 MG tablet Take 500 mg by mouth at bedtime.    . nitroGLYCERIN (NITROSTAT) 0.4 MG  SL tablet Place 1 tablet (0.4 mg total) under the tongue every 5 (five) minutes as needed. Up to 3 doses. If no relief after 3rd dose, proceed to ED for evaluation 25 tablet 2  . Omega-3 Fatty Acids (FISH OIL PO) Take 1 capsule by mouth daily with breakfast.    . simvastatin (ZOCOR) 5 MG tablet Take 5 mg by mouth daily.    . VESICARE 5 MG tablet TAKE ONE TABLET BY MOUTH ONCE DAILY 90 tablet 0   No facility-administered medications prior to visit.     Allergies:   Patient has no known allergies.   Social History   Socioeconomic History  . Marital status: Widowed    Spouse name: Not on file  . Number of children: Not on file  . Years of education: Not  on file  . Highest education level: Not on file  Occupational History  . Not on file  Tobacco Use  . Smoking status: Never Smoker  . Smokeless tobacco: Never Used  Vaping Use  . Vaping Use: Never used  Substance and Sexual Activity  . Alcohol use: No  . Drug use: No  . Sexual activity: Not on file  Other Topics Concern  . Not on file  Social History Narrative  . Not on file   Social Determinants of Health   Financial Resource Strain:   . Difficulty of Paying Living Expenses:   Food Insecurity:   . Worried About Charity fundraiser in the Last Year:   . Arboriculturist in the Last Year:   Transportation Needs:   . Film/video editor (Medical):   Marland Kitchen Lack of Transportation (Non-Medical):   Physical Activity:   . Days of Exercise per Week:   . Minutes of Exercise per Session:   Stress:   . Feeling of Stress :   Social Connections:   . Frequency of Communication with Friends and Family:   . Frequency of Social Gatherings with Friends and Family:   . Attends Religious Services:   . Active Member of Clubs or Organizations:   . Attends Archivist Meetings:   Marland Kitchen Marital Status:      Family History:  The patient's ***family history includes Cancer in her mother and sister; Heart attack in her father.   Review of Systems:   Please see the history of present illness.     General:  No chills, fever, night sweats or weight changes.  Cardiovascular:  No chest pain, dyspnea on exertion, edema, orthopnea, palpitations, paroxysmal nocturnal dyspnea. Dermatological: No rash, lesions/masses Respiratory: No cough, dyspnea Urologic: No hematuria, dysuria Abdominal:   No nausea, vomiting, diarrhea, bright red blood per rectum, melena, or hematemesis Neurologic:  No visual changes, wkns, changes in mental status. All other systems reviewed and are otherwise negative except as noted above.   Physical Exam:    VS:  There were no vitals taken for this visit.   General:  Well developed, well nourished,female appearing in no acute distress. Head: Normocephalic, atraumatic. Neck: No carotid bruits. JVD not elevated.  Lungs: Respirations regular and unlabored, without wheezes or rales.  Heart: ***Regular rate and rhythm. No S3 or S4.  No murmur, no rubs, or gallops appreciated. Abdomen: Appears non-distended. No obvious abdominal masses. Msk:  Strength and tone appear normal for age. No obvious joint deformities or effusions. Extremities: No clubbing or cyanosis. No edema.  Distal pedal pulses are 2+ bilaterally. Neuro: Alert and oriented X 3. Moves all extremities spontaneously.  No focal deficits noted. Psych:  Responds to questions appropriately with a normal affect. Skin: No rashes or lesions noted  Wt Readings from Last 3 Encounters:  10/10/19 136 lb (61.7 kg)  04/03/19 135 lb (61.2 kg)  05/18/17 125 lb (56.7 kg)        Studies/Labs Reviewed:   EKG:  EKG is*** ordered today.  The ekg ordered today demonstrates ***  Recent Labs: No results found for requested labs within last 8760 hours.   Lipid Panel    Component Value Date/Time   CHOL 112 01/03/2016 0512   TRIG 70 01/03/2016 0512   HDL 49 01/03/2016 0512   CHOLHDL 2.3 01/03/2016 0512   VLDL 14 01/03/2016 0512   LDLCALC 49 01/03/2016 0512    Additional studies/ records that were reviewed today include:   Echocardiogram: 09/2019 Summary  1. The left ventricular systolic function is severely decreased, LVEF is  visually estimated at 25-30%.  2. There is decreased contractile function involving the apical anterior,  apical lateral, apical inferior, mid inferolateral, mid inferior and mid  anteroseptal segment(s).  3. There is grade I diastolic dysfunction (impaired relaxation).  4. The left atrium is mildly dilated in size.  5. The right ventricle is upper normal in size, with normal systolic  function.  6. There is moderate tricuspid regurgitation.  7. There is  moderate pulmonary hypertension, estimated pulmonary artery  systolic pressure is 47 mmHg.  8. Agitated saline study is negative.  9. No evidence of an interatrial communication or intrapulmonary shunt.     Cardiac Catheterization: 09/2019 Coronary Angiography  Dominance: Right   Left Main: The left main coronary artery (LMCA) is a large-caliber vessel  that originates from the left coronary sinus. It bifurcates into the left  anterior descending (LAD) and left circumflex (LCx) arteries. There is no  angiographic evidence of significant disease in the LMCA.   LAD: The LAD is a large-caliber vessel that gives off 3 small-caliber  diagonal (D) branches before it wraps around the apex. There is no  angiographicevidence of significant disease in the LAD.   Left Circumflex: The LCx is a large-caliber vessel that gives off 2  obtuse marginal (OM) branches and then continues as a small vessel in the  AV groove. OM1 is a large-caliber vessel. OM2 is a small-caliber vessel.  There is no angiographic evidence of significant disease in the LCx.   Right Coronary: The right coronary artery (RCA) is a large-caliber vessel  originating from the right coronary sinus. It bifurcates distally into the  posterior descending artery (PDA) and a posterolateral (PL) branch  consistent with a right dominant system. There is no angiographic evidence  of significant disease in the RCA.   Complications: None  Blood loss: Minimal  Specimen: None  Device/Implants: N/A   Pre-procedure Dx: Chest pain  Post-procedure Dx: Takutsubo     Assessment:    No diagnosis found.   Plan:   In order of problems listed above:  1. ***    Medication Adjustments/Labs and Tests Ordered: Current medicines are reviewed at length with the patient today.  Concerns regarding medicines are outlined above.  Medication changes, Labs and Tests ordered today are listed in the Patient Instructions  below. There are no Patient Instructions on file for this visit.   Signed, Erma Heritage, PA-C  01/22/2020 7:58 AM    [place] [address] [place], [place] [address] Phone: [phone] Fax: [phone]

## 2020-02-12 DIAGNOSIS — E119 Type 2 diabetes mellitus without complications: Secondary | ICD-10-CM | POA: Diagnosis not present

## 2020-02-12 DIAGNOSIS — I10 Essential (primary) hypertension: Secondary | ICD-10-CM | POA: Diagnosis not present

## 2020-02-25 DIAGNOSIS — I209 Angina pectoris, unspecified: Secondary | ICD-10-CM | POA: Diagnosis not present

## 2020-02-25 DIAGNOSIS — E1169 Type 2 diabetes mellitus with other specified complication: Secondary | ICD-10-CM | POA: Diagnosis not present

## 2020-02-25 DIAGNOSIS — M542 Cervicalgia: Secondary | ICD-10-CM | POA: Diagnosis not present

## 2020-02-25 DIAGNOSIS — I10 Essential (primary) hypertension: Secondary | ICD-10-CM | POA: Diagnosis not present

## 2020-02-25 DIAGNOSIS — Z0000 Encounter for general adult medical examination without abnormal findings: Secondary | ICD-10-CM | POA: Diagnosis not present

## 2020-03-17 DIAGNOSIS — E1169 Type 2 diabetes mellitus with other specified complication: Secondary | ICD-10-CM | POA: Diagnosis not present

## 2020-03-17 DIAGNOSIS — I10 Essential (primary) hypertension: Secondary | ICD-10-CM | POA: Diagnosis not present

## 2020-03-25 DIAGNOSIS — Z961 Presence of intraocular lens: Secondary | ICD-10-CM | POA: Diagnosis not present

## 2020-03-25 DIAGNOSIS — H02831 Dermatochalasis of right upper eyelid: Secondary | ICD-10-CM | POA: Diagnosis not present

## 2020-03-25 DIAGNOSIS — H353134 Nonexudative age-related macular degeneration, bilateral, advanced atrophic with subfoveal involvement: Secondary | ICD-10-CM | POA: Diagnosis not present

## 2020-03-25 DIAGNOSIS — H16223 Keratoconjunctivitis sicca, not specified as Sjogren's, bilateral: Secondary | ICD-10-CM | POA: Diagnosis not present

## 2020-03-25 DIAGNOSIS — H02834 Dermatochalasis of left upper eyelid: Secondary | ICD-10-CM | POA: Diagnosis not present

## 2020-04-02 ENCOUNTER — Ambulatory Visit (HOSPITAL_COMMUNITY)
Admission: RE | Admit: 2020-04-02 | Discharge: 2020-04-02 | Disposition: A | Discharge status: Home / Self Care | Payer: Medicare Other | Source: Ambulatory Visit | Attending: Cardiology | Admitting: Cardiology

## 2020-04-02 ENCOUNTER — Other Ambulatory Visit: Payer: Self-pay

## 2020-04-02 ENCOUNTER — Ambulatory Visit: Payer: Medicare Other | Admitting: Physician Assistant

## 2020-04-02 DIAGNOSIS — I5181 Takotsubo syndrome: Secondary | ICD-10-CM | POA: Diagnosis not present

## 2020-04-02 LAB — ECHOCARDIOGRAM COMPLETE
AR max vel: 2.41 cm2
AV Area VTI: 2.83 cm2
AV Area mean vel: 2.62 cm2
AV Mean grad: 2.6 mmHg
AV Peak grad: 5.3 mmHg
Ao pk vel: 1.15 m/s
Area-P 1/2: 2.39 cm2
S' Lateral: 2.39 cm

## 2020-04-02 NOTE — Progress Notes (Signed)
*  PRELIMINARY RESULTS* Echocardiogram 2D Echocardiogram has been performed.  Samuel Germany 04/02/2020, 11:24 AM

## 2020-04-05 NOTE — Progress Notes (Deleted)
Cardiology Office Note    Date:  04/05/2020   ID:  Debra Villarreal, DOB December 17, 1931, MRN [id number]  PCP:  Neale Burly, MD  Cardiologist: Rozann Lesches, MD EPS: None  No chief complaint on file.   History of Present Illness:  Debra Villarreal is a 86 y.o. female with history of Takotsubo cardiomyopathy in 2007.  She had recurrent Takotsubo cardiomyopathy 09/2019 after an episode of chest pain and syncope while she was at her sister's funeral.  She was admitted to [facility] had minimally elevated troponins and underwent cardiac cath 09/19/2019 that did not demonstrate any significant CAD.  Echo was consistent with Takotsubo cardiomyopathy EF 25-30%, mod MR and TR .  Patient was started on lisinopril but now restart beta-blocker.  She saw Dr. Domenic Polite back 09/2019 and he started on bisoprolol 2.5 mg daily.  Follow-up echo 04/02/2020 showed normalized LVEF 60 to 65% Mild MR     Past Medical History:  Diagnosis Date   Anxiety    Arthritis    Bilateral cataracts    Hypertension    Takotsubo cardiomyopathy 2007   Normary coronaries 2007, initial EF 18% improved to 60%    Type 2 diabetes mellitus ([place])    Uterine cancer [facility])     Past Surgical History:  Procedure Laterality Date   ABDOMINAL HYSTERECTOMY     1966   APPENDECTOMY     CATARACT EXTRACTION W/PHACO  10/12/2011   Procedure: CATARACT EXTRACTION PHACO AND INTRAOCULAR LENS PLACEMENT ([place]);  Surgeon: Tonny Branch, MD;  Location: [facility] ORS;  Service: Ophthalmology;  Laterality: Left;  CDE:18.57   CATARACT EXTRACTION W/PHACO  10/23/2011   Procedure: CATARACT EXTRACTION PHACO AND INTRAOCULAR LENS PLACEMENT ([facility]);  Surgeon: Tonny Branch, MD;  Location: [facility] ORS;  Service: Ophthalmology;  Laterality: Right;  CDE 18.09   COLONOSCOPY  01/31/2012   Procedure: COLONOSCOPY;  Surgeon: Daneil Dolin, MD;  Location: [facility] ENDO SUITE;  Service: Endoscopy;  Laterality: N/A;  1:45   LEFT HEART CATHETERIZATION WITH CORONARY ANGIOGRAM  N/A 02/09/2012   Procedure: LEFT HEART CATHETERIZATION WITH CORONARY ANGIOGRAM;  Surgeon: Thayer Headings, MD;  Location: [facility] CATH LAB;  Service: Cardiovascular;  Laterality: N/A;    Current Medications: No outpatient medications have been marked as taking for the 04/07/20 encounter (Appointment) with Debra Burn, PA-C.     Allergies:   Patient has no known allergies.   Social History   Socioeconomic History   Marital status: Widowed    Spouse name: Not on file   Number of children: Not on file   Years of education: Not on file   Highest education level: Not on file  Occupational History   Not on file  Tobacco Use   Smoking status: Never Smoker   Smokeless tobacco: Never Used  Vaping Use   Vaping Use: Never used  Substance and Sexual Activity   Alcohol use: No   Drug use: No   Sexual activity: Not on file  Other Topics Concern   Not on file  Social History Narrative   Not on file   Social Determinants of Health   Financial Resource Strain:    Difficulty of Paying Living Expenses: Not on file  Food Insecurity:    Worried About [place] in the Last Year: Not on file   Ran Out of Food in the Last Year: Not on file  Transportation Needs:    Lack of Transportation (Medical): Not on file   Lack  of Transportation (Non-Medical): Not on file  Physical Activity:    Days of Exercise per Week: Not on file   Minutes of Exercise per Session: Not on file  Stress:    Feeling of Stress : Not on file  Social Connections:    Frequency of Communication with Friends and Family: Not on file   Frequency of Social Gatherings with Friends and Family: Not on file   Attends Religious Services: Not on file   Active Member of Clubs or Organizations: Not on file   Attends Archivist Meetings: Not on file   Marital Status: Not on file     Family History:  The patient's ***family history includes Cancer in her mother and sister; Heart  attack in her father.   ROS:   Please see the history of present illness.    ROS All other systems reviewed and are negative.   PHYSICAL EXAM:   VS:  There were no vitals taken for this visit.  Physical Exam  GEN: Well nourished, well developed, in no acute distress  HEENT: normal  Neck: no JVD, carotid bruits, or masses Cardiac:RRR; no murmurs, rubs, or gallops  Respiratory:  clear to auscultation bilaterally, normal work of breathing GI: soft, nontender, nondistended, + BS Ext: without cyanosis, clubbing, or edema, Good distal pulses bilaterally MS: no deformity or atrophy  Skin: warm and dry, no rash Neuro:  Alert and Oriented x 3, Strength and sensation are intact Psych: euthymic mood, full affect  Wt Readings from Last 3 Encounters:  10/10/19 136 lb (61.7 kg)  04/03/19 135 lb (61.2 kg)  05/18/17 125 lb (56.7 kg)      Studies/Labs Reviewed:   EKG:  EKG is*** ordered today.  The ekg ordered today demonstrates ***  Recent Labs: No results found for requested labs within last 8760 hours.   Lipid Panel    Component Value Date/Time   CHOL 112 01/03/2016 0512   TRIG 70 01/03/2016 0512   HDL 49 01/03/2016 0512   CHOLHDL 2.3 01/03/2016 0512   VLDL 14 01/03/2016 0512   LDLCALC 49 01/03/2016 0512    Additional studies/ records that were reviewed today include:  Echo 04/02/20  IMPRESSIONS     1. Left ventricular ejection fraction, by estimation, is 60 to 65%. The  left ventricle has normal function. The left ventricle has no regional  wall motion abnormalities. Left ventricular diastolic parameters are  consistent with Grade I diastolic  dysfunction (impaired relaxation).   2. Right ventricular systolic function is normal. The right ventricular  size is normal. There is normal pulmonary artery systolic pressure.   3. The mitral valve is normal in structure. Mild mitral valve  regurgitation. No evidence of mitral stenosis.   4. The aortic valve is tricuspid.  Aortic valve regurgitation is not  visualized. No aortic stenosis is present.   5. Aortic dilatation noted. There is mild dilatation of the ascending  aorta, measuring 38 mm.   6. The inferior vena cava is normal in size with greater than 50%  respiratory variability, suggesting right atrial pressure of 3 mmHg.   FINDINGS   Left Ventricle: Left ventricular ejection fraction, by estimation, is 60  to 65%. The left ventricle has normal function. The left ventricle has no  regional wall motion abnormalities. The left ventricular internal cavity  size was normal in size. There is   no left ventricular hypertrophy. Left ventricular diastolic parameters  are consistent with Grade I diastolic dysfunction (impaired relaxation).  Normal left ventricular filling pressure.   Right Ventricle: The right ventricular size is normal. No increase in  right ventricular wall thickness. Right ventricular systolic function is  normal. There is normal pulmonary artery systolic pressure. The tricuspid  regurgitant velocity is 2.52 m/s, and   with an assumed right atrial pressure of 3 mmHg, the estimated right  ventricular systolic pressure is 75.1 mmHg.   Left Atrium: Left atrial size was normal in size.   Right Atrium: Right atrial size was normal in size.   Pericardium: There is no evidence of pericardial effusion.   Mitral Valve: The mitral valve is normal in structure. Mild mitral valve  regurgitation. No evidence of mitral valve stenosis.   Tricuspid Valve: The tricuspid valve is normal in structure. Tricuspid  valve regurgitation is mild . No evidence of tricuspid stenosis.   Aortic Valve: The aortic valve is tricuspid. Aortic valve regurgitation is  not visualized. No aortic stenosis is present. Aortic valve mean gradient  measures 2.6 mmHg. Aortic valve peak gradient measures 5.3 mmHg. Aortic  valve area, by VTI measures 2.83  cm.   Pulmonic Valve: The pulmonic valve was not well  visualized. Pulmonic valve  regurgitation is not visualized. No evidence of pulmonic stenosis.   Aorta: The aortic root is normal in size and structure and aortic  dilatation noted. There is mild dilatation of the ascending aorta,  measuring 38 mm.   Pulmonary Artery: No pulmonary HTN, PASP is 27 mmHg.   Venous: The inferior vena cava is normal in size with greater than 50%  respiratory variability, suggesting right atrial pressure of 3 mmHg.   IAS/Shunts: No atrial level shunt detected by color flow Doppler.  Cardiac catheterization 09/19/2019 [facility]): FINDINGS Hemodynamics and Left Heart Catheterization  Aortic pressure: 113/71 mm Hg (mean 85 mm Hg)  Left ventricular filling pressure not obtained Left Ventriculogram  RAO Left Ventriculogram:Not Performed Coronary Angiography Dominance: Right Left Main: The left main coronary artery (LMCA) is a large-caliber vessel that originates from the left coronary sinus. It bifurcates into the left anterior descending (LAD) and left circumflex (LCx) arteries. There is no angiographic evidence of significant disease in the LMCA. LAD: The LAD is a large-caliber vessel that gives off 3 small-caliber diagonal (D) branches before it wraps around the apex. There is no angiographic evidence of significant disease in the LAD. Left Circumflex: The LCx is a large-caliber vessel that gives off 2 obtuse marginal (OM) branches and then continues as a small vessel in the AV groove. OM1 is a large-caliber vessel. OM2 is a small-caliber vessel. There is no angiographic evidence of significant disease in the LCx. Right Coronary: The right coronary artery (RCA) is a large-caliber vessel originating from the right coronary sinus. It bifurcates distally into the posterior descending artery (PDA) and a posterolateral (PL) branch consistent with a right dominant system. There is no angiographic evidence of significant disease in the RCA. Complications: None Blood loss: Minimal  Specimen: None Device/Implants: N/A Pre-procedure Dx: Chest pain Post-procedure Dx: Takutsubo I personally spent 12 minutes continuously monitoring the patient during the administration of moderate sedation. Pre and post sedation activities have been reviewed.    Echocardiogram 09/20/2019 [facility]): Summary    1. The left ventricular systolic function is severely decreased, LVEF is   visually estimated at 25-30%.    2. There is decreased contractile function involving the apical anterior,   apical lateral, apical inferior, mid inferolateral, mid inferior and mid   anteroseptal segment(s).  3. There is grade I diastolic dysfunction (impaired relaxation).    4. The left atrium is mildly dilated in size.    5. The right ventricle is upper normal in size, with normal systolic   function.    6. There is moderate tricuspid regurgitation.    7. There is moderate pulmonary hypertension, estimated pulmonary artery   systolic pressure is 47 mmHg.    8. Agitated saline study is negative.    9. No evidence of an interatrial communication or intrapulmonary shunt.        ASSESSMENT:    No diagnosis found.   PLAN:  In order of problems listed above:  Takotsubo cardiomyopathy 09/2019 LVEF 25 to 30% with moderate MR and TR, normal coronary arteries on cath at [facility].  Follow-up echo 04/02/2020 LVEF 60 to 65% with mild MR.  Valvular heart disease improved on most recent echo    Medication Adjustments/Labs and Tests Ordered: Current medicines are reviewed at length with the patient today.  Concerns regarding medicines are outlined above.  Medication changes, Labs and Tests ordered today are listed in the Patient Instructions below. There are no Patient Instructions on file for this visit.   Sumner Boast, PA-C  04/05/2020 3:06 PM    [facility] [address], [place], [place]  [address] Phone: [phone]; Fax: [phone]

## 2020-04-07 ENCOUNTER — Telehealth: Payer: Medicare Other | Admitting: Physician Assistant

## 2020-04-07 ENCOUNTER — Other Ambulatory Visit: Payer: Self-pay

## 2020-04-07 ENCOUNTER — Telehealth: Payer: Self-pay | Admitting: Physician Assistant

## 2020-04-07 NOTE — Telephone Encounter (Signed)
  Patient Consent for Virtual Visit         Debra Villarreal has provided verbal consent on 04/07/2020 for a virtual visit (video or telephone).   CONSENT FOR VIRTUAL VISIT FOR:  Debra Villarreal  By participating in this virtual visit I agree to the following:  I hereby voluntarily request, consent and authorize [facility] and its employed or contracted physicians, physician assistants, nurse practitioners or other licensed health care professionals (the Practitioner), to provide me with telemedicine health care services (the "Services") as deemed necessary by the treating Practitioner. I acknowledge and consent to receive the Services by the Practitioner via telemedicine. I understand that the telemedicine visit will involve communicating with the Practitioner through live audiovisual communication technology and the disclosure of certain medical information by electronic transmission. I acknowledge that I have been given the opportunity to request an in-person assessment or other available alternative prior to the telemedicine visit and am voluntarily participating in the telemedicine visit.  I understand that I have the right to withhold or withdraw my consent to the use of telemedicine in the course of my care at any time, without affecting my right to future care or treatment, and that the Practitioner or I may terminate the telemedicine visit at any time. I understand that I have the right to inspect all information obtained and/or recorded in the course of the telemedicine visit and may receive copies of available information for a reasonable fee.  I understand that some of the potential risks of receiving the Services via telemedicine include:  [place] Kitchen Delay or interruption in medical evaluation due to technological equipment failure or disruption; . Information transmitted may not be sufficient (e.g. poor resolution of images) to allow for appropriate medical decision making by the  Practitioner; and/or  . In rare instances, security protocols could fail, causing a breach of personal health information.  Furthermore, I acknowledge that it is my responsibility to provide information about my medical history, conditions and care that is complete and accurate to the best of my ability. I acknowledge that Practitioner's advice, recommendations, and/or decision may be based on factors not within their control, such as incomplete or inaccurate data provided by me or distortions of diagnostic images or specimens that may result from electronic transmissions. I understand that the practice of medicine is not an exact science and that Practitioner makes no warranties or guarantees regarding treatment outcomes. I acknowledge that a copy of this consent can be made available to me via my patient portal ([facility]), or I can request a printed copy by calling the office of [facility].    I understand that my insurance will be billed for this visit.   I have read or had this consent read to me. . I understand the contents of this consent, which adequately explains the benefits and risks of the Services being provided via telemedicine.  . I have been provided ample opportunity to ask questions regarding this consent and the Services and have had my questions answered to my satisfaction. . I give my informed consent for the services to be provided through the use of telemedicine in my medical care

## 2020-04-14 DIAGNOSIS — E1169 Type 2 diabetes mellitus with other specified complication: Secondary | ICD-10-CM | POA: Diagnosis not present

## 2020-04-14 DIAGNOSIS — I10 Essential (primary) hypertension: Secondary | ICD-10-CM | POA: Diagnosis not present

## 2020-05-17 DIAGNOSIS — E1169 Type 2 diabetes mellitus with other specified complication: Secondary | ICD-10-CM | POA: Diagnosis not present

## 2020-05-17 DIAGNOSIS — I10 Essential (primary) hypertension: Secondary | ICD-10-CM | POA: Diagnosis not present

## 2020-06-08 DIAGNOSIS — M542 Cervicalgia: Secondary | ICD-10-CM | POA: Diagnosis not present

## 2020-06-08 DIAGNOSIS — Z6824 Body mass index (BMI) 24.0-24.9, adult: Secondary | ICD-10-CM | POA: Diagnosis not present

## 2020-06-08 DIAGNOSIS — E1169 Type 2 diabetes mellitus with other specified complication: Secondary | ICD-10-CM | POA: Diagnosis not present

## 2020-06-08 DIAGNOSIS — I10 Essential (primary) hypertension: Secondary | ICD-10-CM | POA: Diagnosis not present

## 2020-09-06 DIAGNOSIS — Z6824 Body mass index (BMI) 24.0-24.9, adult: Secondary | ICD-10-CM | POA: Diagnosis not present

## 2020-09-06 DIAGNOSIS — E1169 Type 2 diabetes mellitus with other specified complication: Secondary | ICD-10-CM | POA: Diagnosis not present

## 2020-09-06 DIAGNOSIS — Z0000 Encounter for general adult medical examination without abnormal findings: Secondary | ICD-10-CM | POA: Diagnosis not present

## 2020-09-06 DIAGNOSIS — I10 Essential (primary) hypertension: Secondary | ICD-10-CM | POA: Diagnosis not present

## 2020-09-06 DIAGNOSIS — M542 Cervicalgia: Secondary | ICD-10-CM | POA: Diagnosis not present

## 2020-09-27 ENCOUNTER — Ambulatory Visit: Payer: Medicare Other | Admitting: Family Medicine

## 2020-10-01 ENCOUNTER — Ambulatory Visit: Payer: Medicare Other | Admitting: Family Medicine

## 2020-10-20 ENCOUNTER — Telehealth: Payer: Self-pay | Admitting: Cardiology

## 2020-10-20 NOTE — Telephone Encounter (Signed)
Yes, if that is what she would prefer to do.

## 2020-10-20 NOTE — Telephone Encounter (Signed)
New Message:      Pt would like to switch from Dr Domenic Polite service to Dr Percival Spanish in [place]. Is that alright with both of you?

## 2020-10-20 NOTE — Telephone Encounter (Signed)
OK.  I suspect she lives in [place] and that is why she is switching so make sure the follow up is in [place].

## 2020-11-11 ENCOUNTER — Other Ambulatory Visit: Payer: Self-pay

## 2020-11-11 ENCOUNTER — Encounter: Payer: Self-pay | Admitting: General Surgery

## 2020-11-11 ENCOUNTER — Ambulatory Visit (INDEPENDENT_AMBULATORY_CARE_PROVIDER_SITE_OTHER): Payer: Medicare Other | Admitting: General Surgery

## 2020-11-11 VITALS — BP 119/80 | HR 93 | Temp 98.4°F | Resp 14 | Ht 64.0 in | Wt 126.0 lb

## 2020-11-11 DIAGNOSIS — K8020 Calculus of gallbladder without cholecystitis without obstruction: Secondary | ICD-10-CM | POA: Diagnosis not present

## 2020-11-11 NOTE — Progress Notes (Signed)
Debra Villarreal,  I cannot clear this patient without seeing her.  She is new to me and has not been seen in her practice for over a year.  I am not sure that we can get her in before the 10th.  We will look for a spot.   Is this urgent/emergent.  If see can work to have her on our urgent emergent (DOD) schedule.

## 2020-11-11 NOTE — Patient Instructions (Signed)
Minimally Invasive Cholecystectomy Minimally invasive cholecystectomy is surgery to remove the gallbladder. The gallbladder is a pear-shaped organ that lies beneath the liver on the right side of the body. The gallbladder stores bile, which is a fluid that helps the body digest fats. Cholecystectomy is often done to treat inflammation of the gallbladder (cholecystitis). This condition is usually caused by a buildup of gallstones (cholelithiasis) in the gallbladder. Gallstones can block the flow of bile, which can result in inflammation and pain. In severe cases, emergency surgery may be required. This procedure is done though small incisions in the abdomen, instead of one large incision. It is also called laparoscopic surgery. A thin scope with a camera (laparoscope) is inserted through one incision. Then surgical instruments are inserted through the other incisions. In some cases, a minimally invasive surgery may need to be changed to a surgery that is done through a larger incision. This is called open surgery. Tell a health care provider about:  Any allergies you have.  All medicines you are taking, including vitamins, herbs, eye drops, creams, and over-the-counter medicines.  Any problems you or family members have had with anesthetic medicines.  Any blood disorders you have.  Any surgeries you have had.  Any medical conditions you have.  Whether you are pregnant or may be pregnant. What are the risks? Generally, this is a safe procedure. However, problems may occur, including:  Infection.  Bleeding.  Allergic reactions to medicines.  Damage to nearby structures or organs.  A stone remaining in the common bile duct. The common bile duct carries bile from the gallbladder into the small intestine.  A bile leak from the cyst duct that is clipped when your gallbladder is removed. What happens before the procedure? Medicines Ask your health care provider about:  Changing or  stopping your regular medicines. This is especially important if you are taking diabetes medicines or blood thinners.  Taking medicines such as aspirin and ibuprofen. These medicines can thin your blood. Do not take these medicines unless your health care provider tells you to take them.  Taking over-the-counter medicines, vitamins, herbs, and supplements. General instructions  Let your health care provider know if you develop a cold or an infection before surgery.  Plan to have someone take you home from the hospital or clinic.  If you will be going home right after the procedure, plan to have someone with you for 24 hours.  Ask your health care provider: ? How your surgery site will be marked. ? What steps will be taken to help prevent infection. These may include:  Removing hair at the surgery site.  Washing skin with a germ-killing soap.  Taking antibiotic medicine. What happens during the procedure?  An IV will be inserted into one of your veins.  You will be given one or both of the following: ? A medicine to help you relax (sedative). ? A medicine to make you fall asleep (general anesthetic).  A breathing tube will be placed in your mouth.  Your surgeon will make several small incisions in your abdomen.  The laparoscope will be inserted through one of the small incisions. The camera on the laparoscope will send images to a monitor in the operating room. This lets your surgeon see inside your abdomen.  A gas will be pumped into your abdomen. This will expand your abdomen to give the surgeon more room to perform the surgery.  Other tools that are needed for the procedure will be inserted through the   other incisions. The gallbladder will be removed through one of the incisions.  Your common bile duct may be examined. If stones are found in the common bile duct, they may be removed.  After your gallbladder has been removed, the incisions will be closed with stitches  (sutures), staples, or skin glue.  Your incisions may be covered with a bandage (dressing). The procedure may vary among health care providers and hospitals.   What happens after the procedure?  Your blood pressure, heart rate, breathing rate, and blood oxygen level will be monitored until you leave the hospital or clinic.  You will be given medicines as needed to control your pain.  If you were given a sedative during the procedure, it can affect you for several hours. Do not drive or operate machinery until your health care provider says that it is safe. Summary  Minimally invasive cholecystectomy, also called laparoscopic cholecystectomy, is surgery to remove the gallbladder using small incisions.  Tell your health care provider about all the medical conditions you have and all the medicines you are taking for those conditions.  Before the procedure, follow instructions about eating or drinking restrictions and changing or stopping medicines.  If you were given a sedative during the procedure, it can affect you for several hours. Do not drive or operate machinery until your health care provider says that it is safe. This information is not intended to replace advice given to you by your health care provider. Make sure you discuss any questions you have with your health care provider. Document Revised: 03/03/2019 Document Reviewed: 03/03/2019 Elsevier Patient Education  2021 [employer]  

## 2020-11-11 NOTE — H&P (Signed)
Debra Villarreal; [id number]; 1931-12-25   HPI Patient is an 87 year old white female who was referred to my care by Dr. Zada Girt for evaluation treatment of an episode of cholecystitis secondary to cholelithiasis.  She was seen in the emergency room at [facility] on 11/06/2020 and diagnosed with acute cholecystitis secondary to cholelithiasis.  She did have a mild leukocytosis, but her liver enzyme tests are within normal limits.  She did not want surgery there and was discharged home.  She decided she wanted to go home.  Since that time, she has had intermittent episodes of right upper quadrant abdominal pain and nausea.  She denies any fever, chills, or jaundice.  She denies any chest pain or shortness of breath.  She does suffer from anxiety attacks. She has been followed by cardiology.  An echocardiogram done on 04/02/2020 revealed a normal ejection fraction. Past Medical History:  Diagnosis Date  . Anxiety   . Arthritis   . Bilateral cataracts   . Hypertension   . Takotsubo cardiomyopathy 2007   Normary coronaries 2007, initial EF 18% improved to 60%   . Type 2 diabetes mellitus ([place])   . Uterine cancer [facility])     Past Surgical History:  Procedure Laterality Date  . ABDOMINAL HYSTERECTOMY     1966  . APPENDECTOMY    . CATARACT EXTRACTION W/PHACO  10/12/2011   Procedure: CATARACT EXTRACTION PHACO AND INTRAOCULAR LENS PLACEMENT (IOC);  Surgeon: Tonny Branch, MD;  Location: [facility] ORS;  Service: Ophthalmology;  Laterality: Left;  CDE:18.57  . CATARACT EXTRACTION W/PHACO  10/23/2011   Procedure: CATARACT EXTRACTION PHACO AND INTRAOCULAR LENS PLACEMENT (IOC);  Surgeon: Tonny Branch, MD;  Location: [facility] ORS;  Service: Ophthalmology;  Laterality: Right;  CDE 18.09  . COLONOSCOPY  01/31/2012   Procedure: COLONOSCOPY;  Surgeon: Daneil Dolin, MD;  Location: [facility] ENDO SUITE;  Service: Endoscopy;  Laterality: N/A;  1:45  . LEFT HEART CATHETERIZATION WITH CORONARY ANGIOGRAM N/A 02/09/2012   Procedure: LEFT  HEART CATHETERIZATION WITH CORONARY ANGIOGRAM;  Surgeon: Thayer Headings, MD;  Location: [facility] CATH LAB;  Service: Cardiovascular;  Laterality: N/A;    Family History  Problem Relation Age of Onset  . Cancer Mother        Ovarian  . Cancer Sister        Breast  . Heart attack Father   . Colon cancer Neg Hx   . Anesthesia problems Neg Hx   . Hypotension Neg Hx   . Malignant hyperthermia Neg Hx   . Pseudochol deficiency Neg Hx     Current Outpatient Medications on File Prior to Visit  Medication Sig Dispense Refill  . acetaminophen (TYLENOL) 325 MG tablet Take 325 mg by mouth every 6 (six) hours as needed for mild pain or moderate pain.     Marland Kitchen aspirin EC 81 MG tablet Take 81 mg by mouth daily.    . bisoprolol (ZEBETA) 5 MG tablet Take 0.5 tablets (2.5 mg total) by mouth daily. 45 tablet 3  . busPIRone (BUSPAR) 5 MG tablet Take 1 tablet (5 mg total) by mouth 2 (two) times daily. 60 tablet 11  . Calcium Citrate-Vitamin D (CALCIUM + D PO) Take 1 tablet by mouth 2 (two) times daily.    . cefUROXime (CEFTIN) 500 MG tablet Take 500 mg by mouth 2 (two) times daily.    Marland Kitchen escitalopram (LEXAPRO) 5 MG tablet Take 1 tablet (5 mg total) by mouth at bedtime. 30 tablet 11  . Flaxseed, Linseed, (FLAX  SEED OIL) 1000 MG CAPS Take 1 capsule by mouth daily.    . meloxicam (MOBIC) 15 MG tablet Take 1 tablet (15 mg total) by mouth daily. 30 tablet 4  . metFORMIN (GLUCOPHAGE) 500 MG tablet Take 500 mg by mouth at bedtime.    . nitroGLYCERIN (NITROSTAT) 0.4 MG SL tablet Place 1 tablet (0.4 mg total) under the tongue every 5 (five) minutes as needed. Up to 3 doses. If no relief after 3rd dose, proceed to ED for evaluation 25 tablet 2  . Omega-3 Fatty Acids (FISH OIL PO) Take 1 capsule by mouth daily with breakfast.    . simvastatin (ZOCOR) 5 MG tablet Take 5 mg by mouth daily.    . VESICARE 5 MG tablet TAKE ONE TABLET BY MOUTH ONCE DAILY 90 tablet 0  . lisinopril (ZESTRIL) 2.5 MG tablet Take 2.5 mg by mouth daily.      No current facility-administered medications on file prior to visit.    No Known Allergies  Social History   Substance and Sexual Activity  Alcohol Use No    Social History   Tobacco Use  Smoking Status Never Smoker  Smokeless Tobacco Never Used    Review of Systems  Constitutional: Positive for malaise/fatigue.  HENT: Negative.   Eyes: Negative.   Respiratory: Negative.   Cardiovascular: Negative.   Gastrointestinal: Positive for abdominal pain.  Genitourinary: Negative.   Musculoskeletal: Negative.   Skin: Negative.   Neurological: Positive for dizziness.  Endo/Heme/Allergies: Negative.   Psychiatric/Behavioral: Negative.     Objective   Vitals:   11/11/20 1011  BP: 119/80  Pulse: 93  Resp: 14  Temp: 98.4 F (36.9 C)  SpO2: 95%    Physical Exam Vitals reviewed.  Constitutional:      Appearance: Normal appearance. She is normal weight. She is not ill-appearing.  HENT:     Head: Normocephalic and atraumatic.  Eyes:     General: No scleral icterus. Cardiovascular:     Rate and Rhythm: Normal rate and regular rhythm.     Heart sounds: Normal heart sounds. No murmur heard. No friction rub. No gallop.   Pulmonary:     Effort: Pulmonary effort is normal. No respiratory distress.     Breath sounds: Normal breath sounds. No stridor. No wheezing, rhonchi or rales.  Abdominal:     General: Abdomen is flat. Bowel sounds are normal. There is no distension.     Palpations: Abdomen is soft. There is no mass.     Tenderness: There is no abdominal tenderness. There is no guarding or rebound.     Hernia: No hernia is present.     Comments: Mild discomfort to deep palpation in the right upper quadrant.  Skin:    General: Skin is warm and dry.  Neurological:     Mental Status: She is alert and oriented to person, place, and time.   Cardiology and ER notes reviewed  Assessment  Cholecystitis secondary to cholelithiasis Coronary artery disease, though  ejection fraction 60% on last echo. Anxiety producing chest pain, though recent episodes noted. Plan   Patient is scheduled for a laparoscopic cholecystectomy on 11/19/2020.  The risks and benefits of the procedure including bleeding, infection, cardiopulmonary difficulties, hepatobiliary injury, and the possibility of an open procedure were fully explained to the patient, who gave informed consent.  She realizes that she is at increased cardiac risk for an MI, though she is still symptomatic and needs cholecystectomy. Will get cardiology review.

## 2020-11-11 NOTE — Progress Notes (Addendum)
Debra Villarreal; [id number]; 1931-11-16   HPI Patient is an 87 year old white female who was referred to my care by Dr. Zada Girt for evaluation treatment of an episode of cholecystitis secondary to cholelithiasis.  She was seen in the emergency room at [facility] on 11/06/2020 and diagnosed with acute cholecystitis secondary to cholelithiasis.  She did have a mild leukocytosis, but her liver enzyme tests are within normal limits.  She did not want surgery there and was discharged home.  She decided she wanted to go home.  Since that time, she has had intermittent episodes of right upper quadrant abdominal pain and nausea.  She denies any fever, chills, or jaundice.  She denies any chest pain or shortness of breath.  She does suffer from anxiety attacks. She has been followed by cardiology.  An echocardiogram done on 04/02/2020 revealed a normal ejection fraction. Past Medical History:  Diagnosis Date  . Anxiety   . Arthritis   . Bilateral cataracts   . Hypertension   . Takotsubo cardiomyopathy 2007   Normary coronaries 2007, initial EF 18% improved to 60%   . Type 2 diabetes mellitus ([place])   . Uterine cancer [facility])     Past Surgical History:  Procedure Laterality Date  . ABDOMINAL HYSTERECTOMY     1966  . APPENDECTOMY    . CATARACT EXTRACTION W/PHACO  10/12/2011   Procedure: CATARACT EXTRACTION PHACO AND INTRAOCULAR LENS PLACEMENT (IOC);  Surgeon: Tonny Branch, MD;  Location: [facility];  Service: Ophthalmology;  Laterality: Left;  CDE:18.57  . CATARACT EXTRACTION W/PHACO  10/23/2011   Procedure: CATARACT EXTRACTION PHACO AND INTRAOCULAR LENS PLACEMENT (IOC);  Surgeon: Tonny Branch, MD;  Location: [facility] OR[facility];  Service: Ophthalmology;  Laterality: Right;  CDE 18.09  . COLONOSCOPY  01/31/2012   Procedure: COLONOSCOPY;  Surgeon: Daneil Dolin, MD;  Location[facility];  Service: Endoscopy;  Laterality: N/A;  1:45  . LEFT HEART CATHETERIZATION WITH CORONARY ANGIOGRAM N/A 02/09/2012   Procedure: LEFT  HEART CATHETERIZATION WITH CORONARY ANGIOGRAM;  Surgeon: Thayer Headings, MD;  Location: [facility] CATH LAB;  Service: Cardiovascular;  Laterality: N/A;    Family History  Problem Relation Age of Onset  . Cancer Mother        Ovarian  . Cancer Sister        Breast  . Heart attack Father   . Colon cancer Neg Hx   . Anesthesia problems Neg Hx   . Hypotension Neg Hx   . Malignant hyperthermia Neg Hx   . Pseudochol deficiency Neg Hx     Current Outpatient Medications on File Prior to Visit  Medication Sig Dispense Refill  . acetaminophen (TYLENOL) 325 MG tablet Take 325 mg by mouth every 6 (six) hours as needed for mild pain or moderate pain.     Marland Kitchen aspirin EC 81 MG tablet Take 81 mg by mouth daily.    . bisoprolol (ZEBETA) 5 MG tablet Take 0.5 tablets (2.5 mg total) by mouth daily. 45 tablet 3  . busPIRone (BUSPAR) 5 MG tablet Take 1 tablet (5 mg total) by mouth 2 (two) times daily. 60 tablet 11  . Calcium Citrate-Vitamin D (CALCIUM + D PO) Take 1 tablet by mouth 2 (two) times daily.    . cefUROXime (CEFTIN) 500 MG tablet Take 500 mg by mouth 2 (two) times daily.    Marland Kitchen escitalopram (LEXAPRO) 5 MG tablet Take 1 tablet (5 mg total) by mouth at bedtime. 30 tablet 11  . Flaxseed, Linseed, (FLAX  SEED OIL) 1000 MG CAPS Take 1 capsule by mouth daily.    . meloxicam (MOBIC) 15 MG tablet Take 1 tablet (15 mg total) by mouth daily. 30 tablet 4  . metFORMIN (GLUCOPHAGE) 500 MG tablet Take 500 mg by mouth at bedtime.    . nitroGLYCERIN (NITROSTAT) 0.4 MG SL tablet Place 1 tablet (0.4 mg total) under the tongue every 5 (five) minutes as needed. Up to 3 doses. If no relief after 3rd dose, proceed to ED for evaluation 25 tablet 2  . Omega-3 Fatty Acids (FISH OIL PO) Take 1 capsule by mouth daily with breakfast.    . simvastatin (ZOCOR) 5 MG tablet Take 5 mg by mouth daily.    . VESICARE 5 MG tablet TAKE ONE TABLET BY MOUTH ONCE DAILY 90 tablet 0  . lisinopril (ZESTRIL) 2.5 MG tablet Take 2.5 mg by mouth daily.      No current facility-administered medications on file prior to visit.    No Known Allergies  Social History   Substance and Sexual Activity  Alcohol Use No    Social History   Tobacco Use  Smoking Status Never Smoker  Smokeless Tobacco Never Used    Review of Systems  Constitutional: Positive for malaise/fatigue.  HENT: Negative.   Eyes: Negative.   Respiratory: Negative.   Cardiovascular: Negative.   Gastrointestinal: Positive for abdominal pain.  Genitourinary: Negative.   Musculoskeletal: Negative.   Skin: Negative.   Neurological: Positive for dizziness.  Endo/Heme/Allergies: Negative.   Psychiatric/Behavioral: Negative.     Objective   Vitals:   11/11/20 1011  BP: 119/80  Pulse: 93  Resp: 14  Temp: 98.4 F (36.9 C)  SpO2: 95%    Physical Exam Vitals reviewed.  Constitutional:      Appearance: Normal appearance. She is normal weight. She is not ill-appearing.  HENT:     Head: Normocephalic and atraumatic.  Eyes:     General: No scleral icterus. Cardiovascular:     Rate and Rhythm: Normal rate and regular rhythm.     Heart sounds: Normal heart sounds. No murmur heard. No friction rub. No gallop.   Pulmonary:     Effort: Pulmonary effort is normal. No respiratory distress.     Breath sounds: Normal breath sounds. No stridor. No wheezing, rhonchi or rales.  Abdominal:     General: Abdomen is flat. Bowel sounds are normal. There is no distension.     Palpations: Abdomen is soft. There is no mass.     Tenderness: There is no abdominal tenderness. There is no guarding or rebound.     Hernia: No hernia is present.     Comments: Mild discomfort to deep palpation in the right upper quadrant.  Skin:    General: Skin is warm and dry.  Neurological:     Mental Status: She is alert and oriented to person, place, and time.   Cardiology and ER notes reviewed  Assessment  Cholecystitis secondary to cholelithiasis Coronary artery disease, though  ejection fraction 60% on last echo. Anxiety producing chest pain, though recent episodes noted. Plan   Patient is scheduled for a laparoscopic cholecystectomy on 11/19/2020.  The risks and benefits of the procedure including bleeding, infection, cardiopulmonary difficulties, hepatobiliary injury, and the possibility of an open procedure were fully explained to the patient, who gave informed consent.  She realizes that she is at increased cardiac risk for an MI, though she is still symptomatic and needs cholecystectomy.  We will get a cardiology review.

## 2020-11-15 NOTE — Patient Instructions (Signed)
MARGARET COCKERILL  11/15/2020     @PREFPERIOPPHARMACY @   Your procedure is scheduled on  11/19/2020.   Report to [facility] at  1030  A.M.   Call this number if you have problems the morning of surgery:  [phone]   Remember:  Do not eat or drink after midnight.                       Take these medicines the morning of surgery with A SIP OF WATER  Bisoprolol, buspar, lexapro, mobic(if needed), zofran (if needed), vesicare.  DO NOT take any medications for diabetes the morning of your procedure.     Please brush your teeth.  Do not wear jewelry, make-up or nail polish.  Do not wear lotions, powders, or perfumes, or deodorant.  Do not shave 48 hours prior to surgery.  Men may shave face and neck.  Do not bring valuables to the hospital.  [facility] is not responsible for any belongings or valuables.  Contacts, dentures or bridgework may not be worn into surgery.  Leave your suitcase in the car.  After surgery it may be brought to your room.  For patients admitted to the hospital, discharge time will be determined by your treatment team.  Patients discharged the day of surgery will not be allowed to drive home and must have someone with them for 24 hours.    Special instructions:  DO NOT smoke tobacco or vape for 24 hours before your procedure.  Please read over the following fact sheets that you were given. Coughing and Deep Breathing, Surgical Site Infection Prevention, Anesthesia Post-op Instructions and Care and Recovery After Surgery       Minimally Invasive Cholecystectomy, Care After This sheet gives you information about how to care for yourself after your procedure. Your health care provider may also give you more specific instructions. If you have problems or questions, contact your health care provider. What can I expect after the procedure? After the procedure, it is common to have:  Pain at your incision sites. You will be given medicines to  control this pain.  Mild nausea or vomiting.  Bloating and possible shoulder pain from the gas that was used during the procedure. Follow these instructions at home: Medicines  Take over-the-counter and prescription medicines only as told by your health care provider.  If you were prescribed an antibiotic medicine, take or use it as told by your health care provider. Do not stop using the antibiotic even if you start to feel better.  Ask your health care provider if the medicine prescribed to you: ? Requires you to avoid driving or using machinery. ? Can cause constipation. You may need to take these actions to prevent or treat constipation:  Drink enough fluid to keep your urine pale yellow.  Take over-the-counter or prescription medicines.  Eat foods that are high in fiber, such as beans, whole grains, and fresh fruits and vegetables.  Limit foods that are high in fat and processed sugars, such as fried or sweet foods. Incision care  Follow instructions from your health care provider about how to take care of your incisions. Make sure you: ? Wash your hands with soap and water for at least 20 seconds before and after you change your bandage (dressing). If soap and water are not available, use hand sanitizer. ? Change your dressing as told by your health care provider. ? Leave  stitches (sutures), skin glue, or adhesive strips in place. These skin closures may need to be in place for 2 weeks or longer. If adhesive strip edges start to loosen and curl up, you may trim the loose edges. Do not remove adhesive strips completely unless your health care provider tells you to do that.  Do not take baths, swim, or use a hot tub until your health care provider approves. Ask your health care provider if you may take showers. You may only be allowed to take sponge baths.  Check your incision area every day for signs of infection. Check for: ? More redness, swelling, or pain. ? Fluid or  blood. ? Warmth. ? Pus or a bad smell.   Activity  Rest as told by your health care provider.  Avoid sitting for a long time without moving. Get up to take short walks every 1-2 hours. This is important to improve blood flow and breathing. Ask for help if you feel weak or unsteady.  Do not lift anything that is heavier than 10 lb (4.5 kg), or the limit that you are told, until your health care provider says that it is safe.  Do not play contact sports until your health care provider approves.  Do not return to work or school until your health care provider approves.  Return to your normal activities as told by your health care provider. Ask your health care provider what activities are safe for you. General instructions  If you were given a sedative during the procedure, it can affect you for several hours. Do not drive or operate machinery until your health care provider says that it is safe.  Keep all follow-up visits as told by your health care provider. This is important. Contact a health care provider if:  You develop a rash.  You have more redness, swelling, or pain around your incisions.  You have fluid or blood coming from your incisions.  Your incisions feel warm to the touch.  You have pus or a bad smell coming from your incisions.  You have a fever.  One or more of your incisions breaks open. Get help right away if:  You have trouble breathing.  You have chest pain.  You have increasing pain in your shoulders.  You faint or feel dizzy when you stand.  You have severe pain in your abdomen.  You have nausea or vomiting that lasts for more than one day.  You have leg pain. Summary  After your procedure, it is common to have pain at the incision sites. You may also have nausea or bloating.  Follow your health care provider's instructions about medicine, activity restrictions, and caring for your incision areas. Do not do activities that require a lot of  effort.  Contact a health care provider if you have a fever or other signs of infection, such as more redness, swelling, or pain around the incisions.  Get help right away if you have chest pain, increasing pain in the shoulders, or trouble breathing. This information is not intended to replace advice given to you by your health care provider. Make sure you discuss any questions you have with your health care provider. Document Revised: 02/26/2019 Document Reviewed: 02/26/2019 Elsevier Patient Education  2021 Early Anesthesia, Adult, Care After This sheet gives you information about how to care for yourself after your procedure. Your health care provider may also give you more specific instructions. If you have problems or questions, contact your  health care provider. What can I expect after the procedure? After the procedure, the following side effects are common:  Pain or discomfort at the IV site.  Nausea.  Vomiting.  Sore throat.  Trouble concentrating.  Feeling cold or chills.  Feeling weak or tired.  Sleepiness and fatigue.  Soreness and body aches. These side effects can affect parts of the body that were not involved in surgery. Follow these instructions at home: For the time period you were told by your health care provider:  Rest.  Do not participate in activities where you could fall or become injured.  Do not drive or use machinery.  Do not drink alcohol.  Do not take sleeping pills or medicines that cause drowsiness.  Do not make important decisions or sign legal documents.  Do not take care of children on your own.   Eating and drinking  Follow any instructions from your health care provider about eating or drinking restrictions.  When you feel hungry, start by eating small amounts of foods that are soft and easy to digest (bland), such as toast. Gradually return to your regular diet.  Drink enough fluid to keep your urine pale  yellow.  If you vomit, rehydrate by drinking water, juice, or clear broth. General instructions  If you have sleep apnea, surgery and certain medicines can increase your risk for breathing problems. Follow instructions from your health care provider about wearing your sleep device: ? Anytime you are sleeping, including during daytime naps. ? While taking prescription pain medicines, sleeping medicines, or medicines that make you drowsy.  Have a responsible adult stay with you for the time you are told. It is important to have someone help care for you until you are awake and alert.  Return to your normal activities as told by your health care provider. Ask your health care provider what activities are safe for you.  Take over-the-counter and prescription medicines only as told by your health care provider.  If you smoke, do not smoke without supervision.  Keep all follow-up visits as told by your health care provider. This is important. Contact a health care provider if:  You have nausea or vomiting that does not get better with medicine.  You cannot eat or drink without vomiting.  You have pain that does not get better with medicine.  You are unable to pass urine.  You develop a skin rash.  You have a fever.  You have redness around your IV site that gets worse. Get help right away if:  You have difficulty breathing.  You have chest pain.  You have blood in your urine or stool, or you vomit blood. Summary  After the procedure, it is common to have a sore throat or nausea. It is also common to feel tired.  Have a responsible adult stay with you for the time you are told. It is important to have someone help care for you until you are awake and alert.  When you feel hungry, start by eating small amounts of foods that are soft and easy to digest (bland), such as toast. Gradually return to your regular diet.  Drink enough fluid to keep your urine pale yellow.  Return to  your normal activities as told by your health care provider. Ask your health care provider what activities are safe for you. This information is not intended to replace advice given to you by your health care provider. Make sure you discuss any questions you have with your  health care provider. Document Revised: 02/12/2020 Document Reviewed: 09/11/2019 Elsevier Patient Education  2021 [employer]. How to Use Chlorhexidine for Bathing Chlorhexidine gluconate (CHG) is a germ-killing (antiseptic) solution that is used to clean the skin. It can get rid of the bacteria that normally live on the skin and can keep them away for about 24 hours. To clean your skin with CHG, you may be given:  A CHG solution to use in the shower or as part of a sponge bath.  A prepackaged cloth that contains CHG. Cleaning your skin with CHG may help lower the risk for infection:  While you are staying in the intensive care unit of the hospital.  If you have a vascular access, such as a central line, to provide short-term or long-term access to your veins.  If you have a catheter to drain urine from your bladder.  If you are on a ventilator. A ventilator is a machine that helps you breathe by moving air in and out of your lungs.  After surgery. What are the risks? Risks of using CHG include:  A skin reaction.  Hearing loss, if CHG gets in your ears.  Eye injury, if CHG gets in your eyes and is not rinsed out.  The CHG product catching fire. Make sure that you avoid smoking and flames after applying CHG to your skin. Do not use CHG:  If you have a chlorhexidine allergy or have previously reacted to chlorhexidine.  On babies younger than 74 months of age. How to use CHG solution  Use CHG only as told by your health care provider, and follow the instructions on the label.  Use the full amount of CHG as directed. Usually, this is one bottle. During a shower Follow these steps when using CHG solution  during a shower (unless your health care provider gives you different instructions): 1. Start the shower. 2. Use your normal soap and shampoo to wash your face and hair. 3. Turn off the shower or move out of the shower stream. 4. Pour the CHG onto a clean washcloth. Do not use any type of brush or rough-edged sponge. 5. Starting at your neck, lather your body down to your toes. Make sure you follow these instructions: ? If you will be having surgery, pay special attention to the part of your body where you will be having surgery. Scrub this area for at least 1 minute. ? Do not use CHG on your head or face. If the solution gets into your ears or eyes, rinse them well with water. ? Avoid your genital area. ? Avoid any areas of skin that have broken skin, cuts, or scrapes. ? Scrub your back and under your arms. Make sure to wash skin folds. 6. Let the lather sit on your skin for 1-2 minutes or as long as told by your health care provider. 7. Thoroughly rinse your entire body in the shower. Make sure that all body creases and crevices are rinsed well. 8. Dry off with a clean towel. Do not put any substances on your body afterward--such as powder, lotion, or perfume--unless you are told to do so by your health care provider. Only use lotions that are recommended by the manufacturer. 9. Put on clean clothes or pajamas. 10. If it is the night before your surgery, sleep in clean sheets.   During a sponge bath Follow these steps when using CHG solution during a sponge bath (unless your health care provider gives you different instructions): 1.  Use your normal soap and shampoo to wash your face and hair. 2. Pour the CHG onto a clean washcloth. 3. Starting at your neck, lather your body down to your toes. Make sure you follow these instructions: ? If you will be having surgery, pay special attention to the part of your body where you will be having surgery. Scrub this area for at least 1 minute. ? Do not  use CHG on your head or face. If the solution gets into your ears or eyes, rinse them well with water. ? Avoid your genital area. ? Avoid any areas of skin that have broken skin, cuts, or scrapes. ? Scrub your back and under your arms. Make sure to wash skin folds. 4. Let the lather sit on your skin for 1-2 minutes or as long as told by your health care provider. 5. Using a different clean, wet washcloth, thoroughly rinse your entire body. Make sure that all body creases and crevices are rinsed well. 6. Dry off with a clean towel. Do not put any substances on your body afterward--such as powder, lotion, or perfume--unless you are told to do so by your health care provider. Only use lotions that are recommended by the manufacturer. 7. Put on clean clothes or pajamas. 8. If it is the night before your surgery, sleep in clean sheets. How to use CHG prepackaged cloths  Only use CHG cloths as told by your health care provider, and follow the instructions on the label.  Use the CHG cloth on clean, dry skin.  Do not use the CHG cloth on your head or face unless your health care provider tells you to.  When washing with the CHG cloth: ? Avoid your genital area. ? Avoid any areas of skin that have broken skin, cuts, or scrapes. Before surgery Follow these steps when using a CHG cloth to clean before surgery (unless your health care provider gives you different instructions): 1. Using the CHG cloth, vigorously scrub the part of your body where you will be having surgery. Scrub using a back-and-forth motion for 3 minutes. The area on your body should be completely wet with CHG when you are done scrubbing. 2. Do not rinse. Discard the cloth and let the area air-dry. Do not put any substances on the area afterward, such as powder, lotion, or perfume. 3. Put on clean clothes or pajamas. 4. If it is the night before your surgery, sleep in clean sheets.   For general bathing Follow these steps when using  CHG cloths for general bathing (unless your health care provider gives you different instructions). 1. Use a separate CHG cloth for each area of your body. Make sure you wash between any folds of skin and between your fingers and toes. Wash your body in the following order, switching to a new cloth after each step: ? The front of your neck, shoulders, and chest. ? Both of your arms, under your arms, and your hands. ? Your stomach and groin area, avoiding the genitals. ? Your right leg and foot. ? Your left leg and foot. ? The back of your neck, your back, and your buttocks. 2. Do not rinse. Discard the cloth and let the area air-dry. Do not put any substances on your body afterward--such as powder, lotion, or perfume--unless you are told to do so by your health care provider. Only use lotions that are recommended by the manufacturer. 3. Put on clean clothes or pajamas. Contact a health care provider if:  Your skin gets irritated after scrubbing.  You have questions about using your solution or cloth. Get help right away if:  Your eyes become very red or swollen.  Your eyes itch badly.  Your skin itches badly and is red or swollen.  Your hearing changes.  You have trouble seeing.  You have swelling or tingling in your mouth or throat.  You have trouble breathing.  You swallow any chlorhexidine. Summary  Chlorhexidine gluconate (CHG) is a germ-killing (antiseptic) solution that is used to clean the skin. Cleaning your skin with CHG may help to lower your risk for infection.  You may be given CHG to use for bathing. It may be in a bottle or in a prepackaged cloth to use on your skin. Carefully follow your health care provider's instructions and the instructions on the product label.  Do not use CHG if you have a chlorhexidine allergy.  Contact your health care provider if your skin gets irritated after scrubbing. This information is not intended to replace advice given to you by  your health care provider. Make sure you discuss any questions you have with your health care provider. Document Revised: 11/14/2019 Document Reviewed: 11/14/2019 Elsevier Patient Education  [place].

## 2020-11-16 ENCOUNTER — Encounter (HOSPITAL_COMMUNITY)
Admission: RE | Admit: 2020-11-16 | Discharge: 2020-11-16 | Disposition: A | Discharge status: Home / Self Care | Payer: Medicare Other | Source: Ambulatory Visit | Attending: General Surgery | Admitting: General Surgery

## 2020-11-16 ENCOUNTER — Encounter (HOSPITAL_COMMUNITY): Payer: Self-pay

## 2020-11-16 ENCOUNTER — Other Ambulatory Visit: Payer: Self-pay

## 2020-11-16 DIAGNOSIS — Z01818 Encounter for other preprocedural examination: Secondary | ICD-10-CM | POA: Insufficient documentation

## 2020-11-17 LAB — HEMOGLOBIN A1C
Hgb A1c MFr Bld: 5.4 % (ref 4.8–5.6)
Mean Plasma Glucose: 108 mg/dL

## 2020-11-19 ENCOUNTER — Encounter (HOSPITAL_COMMUNITY): Payer: Self-pay | Admitting: General Surgery

## 2020-11-19 ENCOUNTER — Ambulatory Visit (HOSPITAL_COMMUNITY): Payer: Medicare Other | Admitting: Anesthesiology

## 2020-11-19 ENCOUNTER — Encounter (HOSPITAL_COMMUNITY)
Admission: RE | Disposition: A | Discharge status: Home / Self Care | Payer: Self-pay | Source: Home / Self Care | Attending: General Surgery

## 2020-11-19 ENCOUNTER — Ambulatory Visit (HOSPITAL_COMMUNITY)
Admission: RE | Admit: 2020-11-19 | Discharge: 2020-11-19 | Disposition: A | Discharge status: Home / Self Care | Payer: Medicare Other | Attending: General Surgery | Admitting: General Surgery

## 2020-11-19 DIAGNOSIS — Z7982 Long term (current) use of aspirin: Secondary | ICD-10-CM | POA: Diagnosis not present

## 2020-11-19 DIAGNOSIS — K8012 Calculus of gallbladder with acute and chronic cholecystitis without obstruction: Secondary | ICD-10-CM | POA: Diagnosis not present

## 2020-11-19 DIAGNOSIS — K8020 Calculus of gallbladder without cholecystitis without obstruction: Secondary | ICD-10-CM

## 2020-11-19 DIAGNOSIS — Z79899 Other long term (current) drug therapy: Secondary | ICD-10-CM | POA: Insufficient documentation

## 2020-11-19 DIAGNOSIS — Z791 Long term (current) use of non-steroidal anti-inflammatories (NSAID): Secondary | ICD-10-CM | POA: Diagnosis not present

## 2020-11-19 DIAGNOSIS — K8010 Calculus of gallbladder with chronic cholecystitis without obstruction: Secondary | ICD-10-CM | POA: Diagnosis present

## 2020-11-19 DIAGNOSIS — I2510 Atherosclerotic heart disease of native coronary artery without angina pectoris: Secondary | ICD-10-CM | POA: Diagnosis not present

## 2020-11-19 DIAGNOSIS — Z7984 Long term (current) use of oral hypoglycemic drugs: Secondary | ICD-10-CM | POA: Diagnosis not present

## 2020-11-19 HISTORY — PX: CHOLECYSTECTOMY: SHX55

## 2020-11-19 LAB — GLUCOSE, CAPILLARY
Glucose-Capillary: 102 mg/dL — ABNORMAL HIGH (ref 70–99)
Glucose-Capillary: 128 mg/dL — ABNORMAL HIGH (ref 70–99)

## 2020-11-19 SURGERY — LAPAROSCOPIC CHOLECYSTECTOMY
Anesthesia: General | Site: Abdomen

## 2020-11-19 MED ORDER — DEXAMETHASONE SODIUM PHOSPHATE 10 MG/ML IJ SOLN
INTRAMUSCULAR | Status: AC
  Filled 2020-11-19: qty 1

## 2020-11-19 MED ORDER — ONDANSETRON HCL 4 MG/2ML IJ SOLN
INTRAMUSCULAR | Status: AC
  Filled 2020-11-19: qty 2

## 2020-11-19 MED ORDER — ONDANSETRON HCL 4 MG/2ML IJ SOLN
INTRAMUSCULAR | Status: DC | PRN
  Administered 2020-11-19: 4 mg via INTRAVENOUS

## 2020-11-19 MED ORDER — ESMOLOL HCL 100 MG/10ML IV SOLN
INTRAVENOUS | Status: AC
  Filled 2020-11-19: qty 10

## 2020-11-19 MED ORDER — BUPIVACAINE LIPOSOME 1.3 % IJ SUSP
INTRAMUSCULAR | Status: DC | PRN
  Administered 2020-11-19: 20 mL

## 2020-11-19 MED ORDER — SUGAMMADEX SODIUM 500 MG/5ML IV SOLN
INTRAVENOUS | Status: DC | PRN
  Administered 2020-11-19: 200 mg via INTRAVENOUS

## 2020-11-19 MED ORDER — BUPIVACAINE LIPOSOME 1.3 % IJ SUSP
INTRAMUSCULAR | Status: AC
  Filled 2020-11-19: qty 20

## 2020-11-19 MED ORDER — LACTATED RINGERS IV SOLN
INTRAVENOUS | Status: DC
  Administered 2020-11-19: 1000 mL via INTRAVENOUS

## 2020-11-19 MED ORDER — CEFAZOLIN SODIUM-DEXTROSE 2-4 GM/100ML-% IV SOLN
INTRAVENOUS | Status: AC
  Filled 2020-11-19: qty 100

## 2020-11-19 MED ORDER — SODIUM CHLORIDE 0.9 % IR SOLN
Status: DC | PRN
  Administered 2020-11-19: 1000 mL

## 2020-11-19 MED ORDER — HYDROMORPHONE HCL 1 MG/ML IJ SOLN
0.2500 mg | INTRAMUSCULAR | Status: DC | PRN
  Administered 2020-11-19 (×2): 0.25 mg via INTRAVENOUS
  Filled 2020-11-19: qty 0.5

## 2020-11-19 MED ORDER — LIDOCAINE HCL (CARDIAC) PF 100 MG/5ML IV SOSY
PREFILLED_SYRINGE | INTRAVENOUS | Status: DC | PRN
  Administered 2020-11-19: 50 mg via INTRAVENOUS

## 2020-11-19 MED ORDER — DEXAMETHASONE SODIUM PHOSPHATE 10 MG/ML IJ SOLN
INTRAMUSCULAR | Status: DC | PRN
  Administered 2020-11-19: 4 mg via INTRAVENOUS

## 2020-11-19 MED ORDER — FENTANYL CITRATE (PF) 100 MCG/2ML IJ SOLN
INTRAMUSCULAR | Status: AC
  Filled 2020-11-19: qty 2

## 2020-11-19 MED ORDER — ORAL CARE MOUTH RINSE: 15.0000 mL | Freq: Once | OROMUCOSAL | Status: AC

## 2020-11-19 MED ORDER — ONDANSETRON HCL 4 MG/2ML IJ SOLN: 4.0000 mg | Freq: Once | INTRAMUSCULAR | Status: DC | PRN

## 2020-11-19 MED ORDER — CEFAZOLIN SODIUM-DEXTROSE 2-4 GM/100ML-% IV SOLN
2.0000 g | INTRAVENOUS | Status: AC
  Administered 2020-11-19: 2 g via INTRAVENOUS

## 2020-11-19 MED ORDER — TRAMADOL HCL 50 MG PO TABS: 50.0000 mg | ORAL_TABLET | Freq: Four times a day (QID) | ORAL | 0 refills | Status: DC | PRN

## 2020-11-19 MED ORDER — CHLORHEXIDINE GLUCONATE CLOTH 2 % EX PADS: 6.0000 | MEDICATED_PAD | Freq: Once | CUTANEOUS | Status: DC

## 2020-11-19 MED ORDER — FENTANYL CITRATE (PF) 100 MCG/2ML IJ SOLN
INTRAMUSCULAR | Status: DC | PRN
  Administered 2020-11-19: 100 ug via INTRAVENOUS

## 2020-11-19 MED ORDER — SEVOFLURANE IN SOLN
RESPIRATORY_TRACT | Status: AC
  Filled 2020-11-19: qty 250

## 2020-11-19 MED ORDER — ROCURONIUM BROMIDE 100 MG/10ML IV SOLN
INTRAVENOUS | Status: DC | PRN
  Administered 2020-11-19: 10 mg via INTRAVENOUS
  Administered 2020-11-19: 40 mg via INTRAVENOUS

## 2020-11-19 MED ORDER — HEMOSTATIC AGENTS (NO CHARGE) OPTIME
TOPICAL | Status: DC | PRN
  Administered 2020-11-19 (×2): 1 via TOPICAL

## 2020-11-19 MED ORDER — ESMOLOL HCL 100 MG/10ML IV SOLN
INTRAVENOUS | Status: DC | PRN
  Administered 2020-11-19 (×2): 20 mg via INTRAVENOUS
  Administered 2020-11-19: 10 mg via INTRAVENOUS

## 2020-11-19 MED ORDER — CHLORHEXIDINE GLUCONATE 0.12 % MT SOLN
15.0000 mL | Freq: Once | OROMUCOSAL | Status: AC
  Administered 2020-11-19: 15 mL via OROMUCOSAL

## 2020-11-19 MED ORDER — PROPOFOL 10 MG/ML IV BOLUS
INTRAVENOUS | Status: DC | PRN
  Administered 2020-11-19: 100 mg via INTRAVENOUS
  Administered 2020-11-19: 40 mg via INTRAVENOUS

## 2020-11-19 MED ORDER — ROCURONIUM BROMIDE 10 MG/ML (PF) SYRINGE
PREFILLED_SYRINGE | INTRAVENOUS | Status: AC
  Filled 2020-11-19: qty 20

## 2020-11-19 MED ORDER — PHENYLEPHRINE 40 MCG/ML (10ML) SYRINGE FOR IV PUSH (FOR BLOOD PRESSURE SUPPORT)
PREFILLED_SYRINGE | INTRAVENOUS | Status: AC
  Filled 2020-11-19: qty 30

## 2020-11-19 SURGICAL SUPPLY — 47 items
ADH SKN CLS APL DERMABOND .7 (GAUZE/BANDAGES/DRESSINGS) ×1
APL PRP STRL LF DISP 70% ISPRP (MISCELLANEOUS) ×1
APL SRG 38 LTWT LNG FL B (MISCELLANEOUS) ×1
APPLICATOR ARISTA FLEXITIP XL (MISCELLANEOUS) ×3 IMPLANT
APPLIER CLIP ROT 10 11.4 M/L (STAPLE) ×3
APR CLP MED LRG 11.4X10 (STAPLE) ×1
BAG RETRIEVAL 10 (BASKET) ×1
BAG RETRIEVAL 10MM (BASKET) ×1
CHLORAPREP W/TINT 26 (MISCELLANEOUS) ×3 IMPLANT
CLIP APPLIE ROT 10 11.4 M/L (STAPLE) ×1 IMPLANT
CLOTH BEACON ORANGE TIMEOUT ST (SAFETY) ×3 IMPLANT
COVER LIGHT HANDLE STERIS (MISCELLANEOUS) ×6 IMPLANT
COVER WAND RF STERILE (DRAPES) ×3 IMPLANT
DERMABOND ADVANCED (GAUZE/BANDAGES/DRESSINGS) ×2
DERMABOND ADVANCED .7 DNX12 (GAUZE/BANDAGES/DRESSINGS) ×1 IMPLANT
ELECT REM PT RETURN 9FT ADLT (ELECTROSURGICAL) ×3
ELECTRODE REM PT RTRN 9FT ADLT (ELECTROSURGICAL) ×1 IMPLANT
GLOVE SURG SS PI 7.5 STRL IVOR (GLOVE) ×3 IMPLANT
GLOVE SURG UNDER POLY LF SZ7 (GLOVE) ×6 IMPLANT
GOWN STRL REUS W/TWL LRG LVL3 (GOWN DISPOSABLE) ×9 IMPLANT
HEMOSTAT ARISTA ABSORB 3G PWDR (HEMOSTASIS) ×2 IMPLANT
HEMOSTAT SNOW SURGICEL 2X4 (HEMOSTASIS) ×3 IMPLANT
INST SET LAPROSCOPIC AP (KITS) ×3 IMPLANT
KIT TURNOVER KIT A (KITS) ×3 IMPLANT
MANIFOLD NEPTUNE II (INSTRUMENTS) ×3 IMPLANT
NDL HYPO 18GX1.5 BLUNT FILL (NEEDLE) ×1 IMPLANT
NDL HYPO 21X1.5 SAFETY (NEEDLE) ×1 IMPLANT
NEEDLE HYPO 18GX1.5 BLUNT FILL (NEEDLE) ×3 IMPLANT
NEEDLE HYPO 21X1.5 SAFETY (NEEDLE) ×3 IMPLANT
NEEDLE INSUFFLATION 14GA 120MM (NEEDLE) ×3 IMPLANT
NS IRRIG 1000ML POUR BTL (IV SOLUTION) ×3 IMPLANT
PACK LAP CHOLE LZT030E (CUSTOM PROCEDURE TRAY) ×3 IMPLANT
PAD ARMBOARD 7.5X6 YLW CONV (MISCELLANEOUS) ×3 IMPLANT
SET BASIN LINEN APH (SET/KITS/TRAYS/PACK) ×3 IMPLANT
SET TUBE SMOKE EVAC HIGH FLOW (TUBING) ×3 IMPLANT
SLEEVE ENDOPATH XCEL 5M (ENDOMECHANICALS) ×3 IMPLANT
SUT MNCRL AB 4-0 PS2 18 (SUTURE) ×6 IMPLANT
SUT VICRYL 0 UR6 27IN ABS (SUTURE) ×5 IMPLANT
SYR 20ML LL LF (SYRINGE) ×6 IMPLANT
SYS BAG RETRIEVAL 10MM (BASKET) ×1
SYSTEM BAG RETRIEVAL 10MM (BASKET) ×1 IMPLANT
TROCAR ENDO BLADELESS 11MM (ENDOMECHANICALS) ×3 IMPLANT
TROCAR XCEL NON-BLD 5MMX100MML (ENDOMECHANICALS) ×3 IMPLANT
TROCAR XCEL UNIV SLVE 11M 100M (ENDOMECHANICALS) ×3 IMPLANT
TUBE CONNECTING 12'X1/4 (SUCTIONS) ×1
TUBE CONNECTING 12X1/4 (SUCTIONS) ×2 IMPLANT
WARMER LAPAROSCOPE (MISCELLANEOUS) ×3 IMPLANT

## 2020-11-19 NOTE — Interval H&P Note (Signed)
History and Physical Interval Note:  11/19/2020 10:11 AM  Debra Villarreal  has presented today for surgery, with the diagnosis of Cholelithiasis.  The various methods of treatment have been discussed with the patient and family. After consideration of risks, benefits and other options for treatment, the patient has consented to  Procedure(s): LAPAROSCOPIC CHOLECYSTECTOMY (N/A) as a surgical intervention.  The patient's history has been reviewed, patient examined, no change in status, stable for surgery.  I have reviewed the patient's chart and labs.  Questions were answered to the patient's satisfaction.     Aviva Signs

## 2020-11-19 NOTE — Anesthesia Procedure Notes (Signed)
Procedure Name: Intubation Date/Time: 11/19/2020 11:11 AM Performed by: Jonna Munro, CRNA Pre-anesthesia Checklist: Patient identified, Emergency Drugs available, Suction available, Patient being monitored and Timeout performed Patient Re-evaluated:Patient Re-evaluated prior to induction Oxygen Delivery Method: Circle system utilized Preoxygenation: Pre-oxygenation with 100% oxygen Induction Type: IV induction Laryngoscope Size: Mac and 3 Grade View: Grade I Tube size: 7.0 mm Number of attempts: 1 Airway Equipment and Method: Stylet Placement Confirmation: ETT inserted through vocal cords under direct vision, positive ETCO2, CO2 detector and breath sounds checked- equal and bilateral Secured at: 20 cm Tube secured with: Tape Dental Injury: Teeth and Oropharynx as per pre-operative assessment

## 2020-11-19 NOTE — Anesthesia Postprocedure Evaluation (Signed)
Anesthesia Post Note  Patient: Debra Villarreal  Procedure(s) Performed: LAPAROSCOPIC CHOLECYSTECTOMY (Abdomen)  Patient location during evaluation: PACU Anesthesia Type: General Level of consciousness: awake and alert and oriented Pain management: pain level controlled Vital Signs Assessment: post-procedure vital signs reviewed and stable Respiratory status: spontaneous breathing and respiratory function stable Cardiovascular status: stable and blood pressure returned to baseline Postop Assessment: no apparent nausea or vomiting Anesthetic complications: no   No notable events documented.   Last Vitals:  Vitals:   11/19/20 1300 11/19/20 1322  BP: 126/65 134/65  Pulse: 70 84  Resp: 17 16  Temp:  36.6 C  SpO2: 92% 92%    Last Pain:  Vitals:   11/19/20 1322  TempSrc: Oral  PainSc: 0-No pain                 Jeslin Bazinet C Nitya Cauthon

## 2020-11-19 NOTE — Anesthesia Preprocedure Evaluation (Addendum)
Anesthesia Evaluation  Patient identified by MRN, date of birth, ID band Patient awake    Reviewed: Allergy & Precautions, NPO status , Patient's Chart, lab work & pertinent test results  History of Anesthesia Complications Negative for: history of anesthetic complications  Airway Mallampati: II  TM Distance: >3 FB Neck ROM: Full    Dental  (+) Edentulous Upper, Edentulous Lower   Pulmonary    Pulmonary exam normal breath sounds clear to auscultation       Cardiovascular hypertension, Pt. on medications +CHF  Normal cardiovascular exam Rhythm:Regular Rate:Normal  Echo 09/2019 1.  Stress-induced cardiomyopathy as discussed above.  LVEF 25 to 30% by echocardiogram at [facility], no significant coronary disease by concurrent cardiac catheterization.  Would anticipate normalization of LVEF over time.  Continue lisinopril, starting bisoprolol 2.5 mg daily.  She is clinically stable at this time.  Follow-up echocardiogram in 2 months with office visit.  2.  Valvular heart disease including moderate mitral and tricuspid regurgitation.  This will also be reevaluated by follow-up echocardiography noted above..  Echo 03/2020  Results reviewed.  Please let her know that cardiac function has now normalized, LVEF 60 to 65%, reassuring.  Degree of mitral regurgitation has also improved, now only mild.   Neuro/Psych Anxiety    GI/Hepatic negative GI ROS, Neg liver ROS,   Endo/Other  diabetes, Well Controlled, Type 2, Oral Hypoglycemic Agents  Renal/GU      Musculoskeletal  (+) Arthritis ,   Abdominal   Peds  Hematology  (+) anemia ,   Anesthesia Other Findings 1.  Stress-induced cardiomyopathy as discussed above.  LVEF 25 to 30% by echocardiogram at [facility], no significant coronary disease by concurrent cardiac catheterization.  Would anticipate normalization of LVEF over time.  Continue lisinopril, starting  bisoprolol 2.5 mg daily.  She is clinically stable at this time.  Follow-up echocardiogram in 2 months with office visit.  2.  Valvular heart disease including moderate mitral and tricuspid regurgitation.  This will also be reevaluated by follow-up echocardiography noted above..  Reproductive/Obstetrics                           Anesthesia Physical Anesthesia Plan  ASA: 3  Anesthesia Plan: General   Post-op Pain Management:    Induction: Intravenous  PONV Risk Score and Plan: 4 or greater and Ondansetron  Airway Management Planned: Oral ETT  Additional Equipment:   Intra-op Plan:   Post-operative Plan: Extubation in OR  Informed Consent: I have reviewed the patients History and Physical, chart, labs and discussed the procedure including the risks, benefits and alternatives for the proposed anesthesia with the patient or authorized representative who has indicated his/her understanding and acceptance.       Plan Discussed with: CRNA and Surgeon  Anesthesia Plan Comments:         Anesthesia Quick Evaluation

## 2020-11-19 NOTE — Transfer of Care (Signed)
Immediate Anesthesia Transfer of Care Note  Patient: Debra Villarreal  Procedure(s) Performed: LAPAROSCOPIC CHOLECYSTECTOMY (Abdomen)  Patient Location: PACU  Anesthesia Type:General  Level of Consciousness: awake, drowsy and patient cooperative  Airway & Oxygen Therapy: Patient Spontanous Breathing  Post-op Assessment: Report given to RN and Post -op Vital signs reviewed and stable  Post vital signs: Reviewed and stable  Last Vitals:  Vitals Value Taken Time  BP 156/84 11/19/20 1200  Temp    Pulse 72 11/19/20 1200  Resp 11 11/19/20 1200  SpO2 93 % 11/19/20 1200  Vitals shown include unvalidated device data.  Last Pain:  Vitals:   11/19/20 1016  TempSrc: Oral  PainSc: 0-No pain      Patients Stated Pain Goal: 6 (24/82/50 0370)  Complications: No notable events documented.

## 2020-11-19 NOTE — Op Note (Signed)
Patient:  Debra Villarreal  DOB:  January 23, 1932  MRN:  [id number]   Preop Diagnosis: Cholecystitis, cholelithiasis  Postop Diagnosis: Same  Procedure: Laparoscopic cholecystectomy  Surgeon: Aviva Signs, MD  Anes: General endotracheal  Indications: Patient is an 87 year old white female who presents with a history of acute cholecystitis secondary to cholelithiasis.  The risks and benefits of the procedure including bleeding, infection, hepatobiliary injury, and the possibility of an open procedure were fully explained to the patient, who gave informed consent.  Patient did receive preoperative cardiac clearance.  Procedure note: The patient was placed in the supine position.  After induction of general endotracheal anesthesia, the abdomen was prepped and draped using the usual sterile technique with ChloraPrep.  Surgical site confirmation was performed.  A supraumbilical incision was made down to the fascia.  A Veress needle was introduced into the abdominal cavity and confirmation of placement was done using the saline drop test.  The abdomen was then insufflated to 15 mmHg pressure.  An 11 mm trocar was introduced into the abdominal cavity under direct visualization without difficulty.  The patient was placed in reverse Trendelenburg position and an additional 11 mm trocar was placed in the epigastric region and 5 mm trochars were placed the right upper quadrant and right flank regions.  Liver was inspected and noted to be within normal limits.  The gallbladder was noted to be full of stones.  It was retracted in a dynamic fashion in order to provide a critical view of the triangle of Calot.  The cystic duct was first identified.  Its juncture to the infundibulum was fully identified.  Endoclips were placed proximally and distally on the cystic duct, and the cystic duct was divided.  This was likewise done to the cystic artery.  The gallbladder was freed away from the gallbladder fossa using Bovie  electrocautery.  The gallbladder was delivered through the epigastric trocar site using an Endo Catch bag.  The gallbladder fossa was inspected and no abnormal bleeding or bile leakage was noted.  Arista and Surgicel were placed in the gallbladder fossa.  All fluid and air were then evacuated from the abdominal cavity prior to removal of the trochars.  All wounds were irrigated with normal saline.  All wounds were injected with Exparel.  The supraumbilical fascia as well as epigastric fascia were reapproximated using 0 Vicryl interrupted sutures.  All skin incisions were closed using a 4-0 Monocryl subcuticular suture.  Dermabond was applied.  All tape and needle counts were correct at the end of the procedure.  The patient was extubated in the operating room and transferred to PACU in stable condition.  Complications: None  EBL: Minimal  Specimen: Gallbladder

## 2020-11-19 NOTE — Discharge Instructions (Signed)
[facility] UNTIL Tuesday November 23, 2020. DO NOT USE NUMBING MEDICATIONS WITHOUT CONSULTING A PHYSICIAN UNTIL AFTER Tuesday.

## 2020-11-22 ENCOUNTER — Encounter (HOSPITAL_COMMUNITY): Payer: Self-pay | Admitting: General Surgery

## 2020-11-22 LAB — SURGICAL PATHOLOGY

## 2020-11-26 ENCOUNTER — Telehealth (INDEPENDENT_AMBULATORY_CARE_PROVIDER_SITE_OTHER): Payer: Medicare Other | Admitting: General Surgery

## 2020-11-26 DIAGNOSIS — Z09 Encounter for follow-up examination after completed treatment for conditions other than malignant neoplasm: Secondary | ICD-10-CM

## 2020-11-26 NOTE — Telephone Encounter (Signed)
Telephone postoperative visit performed with the patient and daughter.  Patient is doing well.  No nausea or vomiting noted.  Final pathology consistent with diagnosis.  Patient was told to follow-up with me as needed.  As this postoperative visit was a part of the global surgical fee, this was not a billable visit.

## 2021-01-24 NOTE — Progress Notes (Signed)
Cardiology Office Note   Date:  01/26/2021   ID:  Debra Villarreal, DOB 12-Oct-1931, MRN [id number]  PCP:  Neale Burly, MD  Cardiologist:   Minus Breeding, MD  Chief Complaint  Patient presents with   Cardiomyopathy       History of Present Illness: Debra Villarreal is a 87 y.o. female who presents for follow up of cardiomyopathy  .  She had previously seen Dr. Domenic Polite and was seen after syncope.  She had a mildly elevated trop with no significant CAD.  Her EF was reduced and consistent with Takotsubo.  This was in October 2020.  At that time the EF was 25%.  Her last EF was 60 - 65% with only mild MR.    She cholecystectomy in June.  She did well.  The patient denies any new symptoms such as chest discomfort, neck or arm discomfort. There has been no new shortness of breath, PND or orthopnea. There have been no reported palpitations, presyncope or syncope.  She lives by herself and does some light household chores.   Past Medical History:  Diagnosis Date   Anxiety    Arthritis    Bilateral cataracts    Hypertension    Takotsubo cardiomyopathy 2007   Normary coronaries 2007, initial EF 18% improved to 60%    Type 2 diabetes mellitus ([place])    Uterine cancer [facility])     Past Surgical History:  Procedure Laterality Date   ABDOMINAL HYSTERECTOMY     1966   APPENDECTOMY     CATARACT EXTRACTION W/PHACO  10/12/2011   Procedure: CATARACT EXTRACTION PHACO AND INTRAOCULAR LENS PLACEMENT ([place]);  Surgeon: Tonny Branch, MD;  Location: [facility] ORS;  Service: Ophthalmology;  Laterality: Left;  CDE:18.57   CATARACT EXTRACTION W/PHACO  10/23/2011   Procedure: CATARACT EXTRACTION PHACO AND INTRAOCULAR LENS PLACEMENT ([facility]);  Surgeon: Tonny Branch, MD;  Location: [facility] ORS;  Service: Ophthalmology;  Laterality: Right;  CDE 18.09   CHOLECYSTECTOMY N/A 11/19/2020   Procedure: LAPAROSCOPIC CHOLECYSTECTOMY;  Surgeon: Aviva Signs, MD;  Location: [facility] ORS;  Service: General;  Laterality: N/A;   COLONOSCOPY   01/31/2012   Procedure: COLONOSCOPY;  Surgeon: Daneil Dolin, MD;  Location: [facility] ENDO SUITE;  Service: Endoscopy;  Laterality: N/A;  1:45   LEFT HEART CATHETERIZATION WITH CORONARY ANGIOGRAM N/A 02/09/2012   Procedure: LEFT HEART CATHETERIZATION WITH CORONARY ANGIOGRAM;  Surgeon: Thayer Headings, MD;  Location[facility] LAB;  Service: Cardiovascular;  Laterality: N/A;     Current Outpatient Medications  Medication Sig Dispense Refill   acetaminophen (TYLENOL) 325 MG tablet Take 325 mg by mouth every 6 (six) hours as needed for mild pain or moderate pain.      alendronate (FOSAMAX) 70 MG tablet Take 70 mg by mouth once a week.     aspirin EC 81 MG tablet Take 81 mg by mouth every 6 (six) hours as needed for moderate pain.     bisoprolol (ZEBETA) 5 MG tablet Take 0.5 tablets (2.5 mg total) by mouth daily. 45 tablet 3   busPIRone (BUSPAR) 5 MG tablet Take 1 tablet (5 mg total) by mouth 2 (two) times daily. 60 tablet 11   Calcium Citrate-Vitamin D (CALCIUM + D PO) Take 1 tablet by mouth 2 (two) times daily.     cefUROXime (CEFTIN) 500 MG tablet Take 500 mg by mouth 2 (two) times daily.     escitalopram (LEXAPRO) 5 MG tablet Take 1 tablet (5 mg total) by  mouth at bedtime. 30 tablet 11   Flaxseed, Linseed, (FLAX SEED OIL) 1000 MG CAPS Take 1,000 mg by mouth daily.     lisinopril (ZESTRIL) 2.5 MG tablet Take 2.5 mg by mouth daily.     metFORMIN (GLUCOPHAGE) 500 MG tablet Take 500 mg by mouth at bedtime.     nitroGLYCERIN (NITROSTAT) 0.4 MG SL tablet Place 1 tablet (0.4 mg total) under the tongue every 5 (five) minutes as needed. Up to 3 doses. If no relief after 3rd dose, proceed to ED for evaluation 25 tablet 2   Omega-3 Fatty Acids (FISH OIL PO) Take 1 capsule by mouth daily with breakfast.     simvastatin (ZOCOR) 5 MG tablet Take 5 mg by mouth daily.     VESICARE 5 MG tablet TAKE ONE TABLET BY MOUTH ONCE DAILY 90 tablet 0   meloxicam (MOBIC) 15 MG tablet Take 1 tablet (15 mg total) by mouth daily.  (Patient not taking: Reported on 01/26/2021) 30 tablet 4   ondansetron (ZOFRAN-ODT) 4 MG disintegrating tablet Take 4 mg by mouth every 8 (eight) hours as needed for nausea/vomiting. (Patient not taking: Reported on 01/26/2021)     promethazine (PHENERGAN) 25 MG tablet Take 25 mg by mouth every 6 (six) hours as needed for nausea/vomiting. (Patient not taking: Reported on 01/26/2021)     traMADol (ULTRAM) 50 MG tablet Take 1 tablet (50 mg total) by mouth every 6 (six) hours as needed. (Patient not taking: Reported on 01/26/2021) 25 tablet 0   No current facility-administered medications for this visit.    Allergies:   Patient has no known allergies.    ROS:  Please see the history of present illness.   Otherwise, review of systems are positive for none.   All other systems are reviewed and negative.    PHYSICAL EXAM: VS:  BP 130/90   Pulse 88   Ht '5\' 4"'$  (1.626 m)   Wt 125 lb (56.7 kg)   BMI 21.46 kg/m  , BMI Body mass index is 21.46 kg/m. GENERAL:  Well appearing NECK:  No jugular venous distention, waveform within normal limits, carotid upstroke brisk and symmetric, no bruits, no thyromegaly LUNGS:  Clear to auscultation bilaterally CHEST:  Unremarkable HEART:  PMI not displaced or sustained,S1 and S2 within normal limits, no S3, no S4, no clicks, no rubs, no murmurs ABD:  Flat, positive bowel sounds normal in frequency in pitch, no bruits, no rebound, no guarding, no midline pulsatile mass, no hepatomegaly, no splenomegaly EXT:  2 plus pulses throughout, no edema, no cyanosis no clubbing    EKG:  EKG is not ordered today. The ekg ordered 11/16/2020 demonstrates sinus rhythm, rate 89, axis within normal limits, intervals within normal limits, no acute ST-T wave changes.   Recent Labs: No results found for requested labs within last 8760 hours.    Lipid Panel    Component Value Date/Time   CHOL 112 01/03/2016 0512   TRIG 70 01/03/2016 0512   HDL 49 01/03/2016 0512   CHOLHDL 2.3  01/03/2016 0512   VLDL 14 01/03/2016 0512   LDLCALC 49 01/03/2016 0512      Wt Readings from Last 3 Encounters:  01/26/21 125 lb (56.7 kg)  11/16/20 129 lb (58.5 kg)  11/11/20 126 lb (57.2 kg)      Other studies Reviewed: Additional studies/ records that were reviewed today include: None. Review of the above records demonstrates:  NA   ASSESSMENT AND PLAN:  Stress induced CM:  She seems to be euvolemic.  No change in therapy.  Her EF had returned to normal.  She will remain on the meds as listed.  She prefers 1 year follow-up.  MR:  This is only mild.  No change in therapy.   Current medicines are reviewed at length with the patient today.  The patient does not have concerns regarding medicines.  The following changes have been made:  no change  Labs/ tests ordered today include: None No orders of the defined types were placed in this encounter.    Disposition:   FU with me in 1 year.   Signed, Minus Breeding, MD  01/26/2021 11:44 AM    [facility]

## 2021-01-26 ENCOUNTER — Encounter: Payer: Self-pay | Admitting: Cardiology

## 2021-01-26 ENCOUNTER — Other Ambulatory Visit: Payer: Self-pay

## 2021-01-26 ENCOUNTER — Ambulatory Visit (INDEPENDENT_AMBULATORY_CARE_PROVIDER_SITE_OTHER): Payer: Medicare Other | Admitting: Cardiology

## 2021-01-26 VITALS — BP 130/90 | HR 88 | Ht 64.0 in | Wt 125.0 lb

## 2021-01-26 DIAGNOSIS — Z01810 Encounter for preprocedural cardiovascular examination: Secondary | ICD-10-CM

## 2021-01-26 DIAGNOSIS — I5181 Takotsubo syndrome: Secondary | ICD-10-CM | POA: Diagnosis not present

## 2021-01-26 NOTE — Patient Instructions (Signed)
Medication Instructions:  The current medical regimen is effective;  continue present plan and medications.  *If you need a refill on your cardiac medications before your next appointment, please call your pharmacy*  Follow-Up: At [facility], you and your health needs are our priority.  As part of our continuing mission to provide you with exceptional heart care, we have created designated Provider Care Teams.  These Care Teams include your primary Cardiologist (physician) and Advanced Practice Providers (APPs -  Physician Assistants and Nurse Practitioners) who all work together to provide you with the care you need, when you need it.  We recommend signing up for the patient portal called "MyChart".  Sign up information is provided on this After Visit Summary.  MyChart is used to connect with patients for Virtual Visits (Telemedicine).  Patients are able to view lab/test results, encounter notes, upcoming appointments, etc.  Non-urgent messages can be sent to your provider as well.   To learn more about what you can do with MyChart, go to [email or web address].    Your next appointment:   1 year(s)  The format for your next appointment:   In Person  Provider:   James Hochrein, MD   Thank you for choosing [facility]!!    

## 2021-04-18 DIAGNOSIS — I10 Essential (primary) hypertension: Secondary | ICD-10-CM | POA: Diagnosis not present

## 2021-04-18 DIAGNOSIS — E1121 Type 2 diabetes mellitus with diabetic nephropathy: Secondary | ICD-10-CM | POA: Diagnosis not present

## 2021-04-18 DIAGNOSIS — E7849 Other hyperlipidemia: Secondary | ICD-10-CM | POA: Diagnosis not present

## 2021-04-18 DIAGNOSIS — Z6823 Body mass index (BMI) 23.0-23.9, adult: Secondary | ICD-10-CM | POA: Diagnosis not present

## 2021-09-06 DIAGNOSIS — E1121 Type 2 diabetes mellitus with diabetic nephropathy: Secondary | ICD-10-CM | POA: Diagnosis not present

## 2021-09-06 DIAGNOSIS — I10 Essential (primary) hypertension: Secondary | ICD-10-CM | POA: Diagnosis not present

## 2021-09-06 DIAGNOSIS — Z6824 Body mass index (BMI) 24.0-24.9, adult: Secondary | ICD-10-CM | POA: Diagnosis not present

## 2021-09-06 DIAGNOSIS — E7849 Other hyperlipidemia: Secondary | ICD-10-CM | POA: Diagnosis not present

## 2021-11-03 ENCOUNTER — Ambulatory Visit (INDEPENDENT_AMBULATORY_CARE_PROVIDER_SITE_OTHER): Payer: Medicare Other | Admitting: Family Medicine

## 2021-11-03 ENCOUNTER — Encounter: Payer: Self-pay | Admitting: Family Medicine

## 2021-11-03 VITALS — BP 124/63 | HR 83 | Temp 97.9°F | Ht 64.0 in | Wt 128.0 lb

## 2021-11-03 DIAGNOSIS — E756 Lipid storage disorder, unspecified: Secondary | ICD-10-CM | POA: Diagnosis not present

## 2021-11-03 DIAGNOSIS — E1169 Type 2 diabetes mellitus with other specified complication: Secondary | ICD-10-CM | POA: Diagnosis not present

## 2021-11-03 MED ORDER — ALENDRONATE SODIUM 70 MG PO TABS: 70.0000 mg | ORAL_TABLET | ORAL | 3 refills | Status: DC

## 2021-11-03 NOTE — Progress Notes (Signed)
Subjective:  Patient ID: Debra Villarreal, female    DOB: 1931-09-25  Age: 88 y.o. MRN: [id number]  CC: New Patient (Initial Visit)   HPI HENNESSY BARTEL presents for new patient evaluation.  Patient has been followed in [place].  Her records are currently unavailable.  She is not sure about what her medicines are.  We attempted to pull those up online and there seemed to be some inconsistency.  For instance it shows that she is taking lisinopril, on the other hand the prescription date on it is 2014 and she has an allergy to it filed in 2019 but it is still shown is an active medicine.  She also has benazepril that she is taking.  She has an allergy to simvastatin listed but also has an active prescription for Zocor apparently.  This is in addition to multiple reported reactions to simvastatin and other statins.  She does know that she is taking fish oil and metformin.  It is unclear if she is actually taking the buspirone and Lotensin.  Currently she is asymptomatic with the exception of some arthritis pain.  She had meloxicam listed as one of her current medicines however she also has an allergy to that listed.  It is unclear whether she is actually taking that or not.  She does not know.  She realizes she should have brought her medications with her.  Apparently she lives now with a son and daughter who help her with her medicines.  They are not available at this time.  She has diabetes for which she takes metformin.  She is pretty sure she is taking that.  She denies any low blood sugar episodes.  She does not take insulin.  She has not had polyuria polydipsia or nausea recently.    11/03/2021    2:38 PM  Depression screen PHQ 2/9  Decreased Interest 0  Down, Depressed, Hopeless 0  PHQ - 2 Score 0    History Benigna has a past medical history of Anxiety, Arthritis, Bilateral cataracts, Hypertension, Takotsubo cardiomyopathy (2007), Type 2 diabetes mellitus ([place]), and Uterine cancer ([place]).    She has a past surgical history that includes Abdominal hysterectomy; Appendectomy; Cataract extraction w/PHACO (10/12/2011); Cataract extraction w/PHACO (10/23/2011); Colonoscopy (01/31/2012); left heart catheterization with coronary angiogram (N/A, 02/09/2012); and Cholecystectomy (N/A, 11/19/2020).   Her family history includes Cancer in her mother and sister; Heart attack in her father.She reports that she has never smoked. She has never used smokeless tobacco. She reports that she does not drink alcohol and does not use drugs.    ROS Review of Systems  Constitutional: Negative.   HENT: Negative.  Negative for congestion.   Eyes:  Negative for visual disturbance.  Respiratory:  Negative for shortness of breath.   Cardiovascular:  Negative for chest pain.  Gastrointestinal:  Negative for abdominal pain, constipation, diarrhea, nausea and vomiting.  Genitourinary:  Negative for difficulty urinating.  Musculoskeletal:  Negative for arthralgias and myalgias.  Neurological:  Negative for headaches.  Psychiatric/Behavioral:  Negative for sleep disturbance.    Objective:  BP 124/63   Pulse 83   Temp 97.9 F (36.6 C)   Ht '5\' 4"'  (1.626 m)   Wt 128 lb (58.1 kg)   SpO2 96%   BMI 21.97 kg/m   BP Readings from Last 3 Encounters:  11/03/21 124/63  01/26/21 130/90  11/19/20 134/65    Wt Readings from Last 3 Encounters:  11/03/21 128 lb (58.1 kg)  01/26/21 125 lb (  56.7 kg)  11/16/20 129 lb (58.5 kg)     Physical Exam Constitutional:      General: She is not in acute distress.    Appearance: She is well-developed.  HENT:     Head: Normocephalic and atraumatic.  Eyes:     Conjunctiva/sclera: Conjunctivae normal.     Pupils: Pupils are equal, round, and reactive to light.  Neck:     Thyroid: No thyromegaly.  Cardiovascular:     Rate and Rhythm: Normal rate and regular rhythm.     Heart sounds: Normal heart sounds. No murmur heard. Pulmonary:     Effort: Pulmonary effort is  normal. No respiratory distress.     Breath sounds: Normal breath sounds. No wheezing or rales.  Abdominal:     General: Bowel sounds are normal. There is no distension.     Palpations: Abdomen is soft.     Tenderness: There is no abdominal tenderness.  Musculoskeletal:        General: Normal range of motion.     Cervical back: Normal range of motion and neck supple.  Lymphadenopathy:     Cervical: No cervical adenopathy.  Skin:    General: Skin is warm and dry.  Neurological:     Mental Status: She is alert and oriented to person, place, and time.  Psychiatric:        Behavior: Behavior normal.        Thought Content: Thought content normal.        Judgment: Judgment normal.      Assessment & Plan:   Ozelle was seen today for new patient (initial visit).  Diagnoses and all orders for this visit:  Diabetic lipidosis ([place]) -     CBC with Differential/Platelet -     CMP14+EGFR -     Lipid panel -     TSH + free T4  Other orders -     alendronate (FOSAMAX) 70 MG tablet; Take 1 tablet (70 mg total) by mouth once a week. For bone health       I have discontinued Shanica T. Serano's nitroGLYCERIN, meloxicam, VESIcare, Flax Seed Oil, escitalopram, lisinopril, bisoprolol, cefUROXime, ondansetron, promethazine, and traMADol. I have also changed her alendronate. Additionally, I am having her maintain her simvastatin, metFORMIN, aspirin EC, Omega-3 Fatty Acids (FISH OIL PO), Calcium Citrate-Vitamin D (CALCIUM + D PO), acetaminophen, busPIRone, and benazepril.  Allergies as of 11/03/2021   No Known Allergies      Medication List        Accurate as of Nov 03, 2021  4:18 PM. If you have any questions, ask your nurse or doctor.          STOP taking these medications    bisoprolol 5 MG tablet Commonly known as: ZEBETA Stopped by: Claretta Fraise, MD   cefUROXime 500 MG tablet Commonly known as: CEFTIN Stopped by: Claretta Fraise, MD   escitalopram 5 MG  tablet Commonly known as: LEXAPRO Stopped by: Claretta Fraise, MD   Flax Seed Oil 1000 MG Caps Stopped by: Claretta Fraise, MD   lisinopril 2.5 MG tablet Commonly known as: ZESTRIL Stopped by: Claretta Fraise, MD   meloxicam 15 MG tablet Commonly known as: MOBIC Stopped by: Claretta Fraise, MD   nitroGLYCERIN 0.4 MG SL tablet Commonly known as: NITROSTAT Stopped by: Claretta Fraise, MD   ondansetron 4 MG disintegrating tablet Commonly known as: ZOFRAN-ODT Stopped by: Claretta Fraise, MD   promethazine 25 MG tablet Commonly known as: PHENERGAN Stopped by: Cletus Gash  Jachai Okazaki, MD   traMADol 50 MG tablet Commonly known as: Ultram Stopped by: Claretta Fraise, MD   VESIcare 5 MG tablet Generic drug: solifenacin Stopped by: Claretta Fraise, MD       TAKE these medications    acetaminophen 325 MG tablet Commonly known as: TYLENOL Take 325 mg by mouth every 6 (six) hours as needed for mild pain or moderate pain.   alendronate 70 MG tablet Commonly known as: FOSAMAX Take 1 tablet (70 mg total) by mouth once a week. For bone health What changed: additional instructions Changed by: Claretta Fraise, MD   aspirin EC 81 MG tablet Take 81 mg by mouth every 6 (six) hours as needed for moderate pain.   benazepril 5 MG tablet Commonly known as: LOTENSIN Take 5 mg by mouth daily.   busPIRone 5 MG tablet Commonly known as: BUSPAR Take 1 tablet (5 mg total) by mouth 2 (two) times daily.   CALCIUM + D PO Take 1 tablet by mouth 2 (two) times daily.   FISH OIL PO Take 1 capsule by mouth daily with breakfast.   metFORMIN 500 MG tablet Commonly known as: GLUCOPHAGE Take 500 mg by mouth at bedtime.   simvastatin 5 MG tablet Commonly known as: ZOCOR Take 5 mg by mouth daily.       Patient is currently stable.  As result for just can have her continue taking what she already is at home.  I do want her back as soon as possible with her medications for review and to determine any possible  changes in treatment that might be useful or necessary.  Follow-up: Return in about 2 weeks (around 11/17/2021) for diabetes, cholesterol.  Claretta Fraise, M.D.

## 2021-11-04 LAB — CMP14+EGFR
ALT: 9 IU/L (ref 0–32)
AST: 18 IU/L (ref 0–40)
Albumin/Globulin Ratio: 1.4 (ref 1.2–2.2)
Albumin: 4.3 g/dL (ref 3.6–4.6)
Alkaline Phosphatase: 65 IU/L (ref 44–121)
BUN/Creatinine Ratio: 28 (ref 12–28)
BUN: 30 mg/dL — ABNORMAL HIGH (ref 8–27)
Bilirubin Total: <0.2 mg/dL (ref 0.0–1.2)
CO2: 23 mmol/L (ref 20–29)
Calcium: 10.2 mg/dL (ref 8.7–10.3)
Chloride: 100 mmol/L (ref 96–106)
Creatinine, Ser: 1.07 mg/dL — ABNORMAL HIGH (ref 0.57–1.00)
Globulin, Total: 3 g/dL (ref 1.5–4.5)
Glucose: 94 mg/dL (ref 70–99)
Potassium: 4.7 mmol/L (ref 3.5–5.2)
Sodium: 139 mmol/L (ref 134–144)
Total Protein: 7.3 g/dL (ref 6.0–8.5)
eGFR: 50 mL/min/{1.73_m2} — ABNORMAL LOW (ref >59)

## 2021-11-04 LAB — CBC WITH DIFFERENTIAL/PLATELET
Basophils Absolute: 0 10*3/uL (ref 0.0–0.2)
Basos: 0 %
EOS (ABSOLUTE): 0.1 10*3/uL (ref 0.0–0.4)
Eos: 1 %
Hematocrit: 38.3 % (ref 34.0–46.6)
Hemoglobin: 12.6 g/dL (ref 11.1–15.9)
Immature Grans (Abs): 0 10*3/uL (ref 0.0–0.1)
Immature Granulocytes: 0 %
Lymphocytes Absolute: 2.3 10*3/uL (ref 0.7–3.1)
Lymphs: 33 %
MCH: 29 pg (ref 26.6–33.0)
MCHC: 32.9 g/dL (ref 31.5–35.7)
MCV: 88 fL (ref 79–97)
Monocytes Absolute: 0.7 10*3/uL (ref 0.1–0.9)
Monocytes: 11 %
Neutrophils Absolute: 3.9 10*3/uL (ref 1.4–7.0)
Neutrophils: 55 %
Platelets: 232 10*3/uL (ref 150–450)
RBC: 4.35 x10E6/uL (ref 3.77–5.28)
RDW: 12.4 % (ref 11.7–15.4)
WBC: 7 10*3/uL (ref 3.4–10.8)

## 2021-11-04 LAB — TSH+FREE T4
Free T4: 1.57 ng/dL (ref 0.82–1.77)
TSH: 0.662 u[IU]/mL (ref 0.450–4.500)

## 2021-11-04 LAB — LIPID PANEL
Chol/HDL Ratio: 2.5 ratio (ref 0.0–4.4)
Cholesterol, Total: 145 mg/dL (ref 100–199)
HDL: 58 mg/dL (ref >39)
LDL Chol Calc (NIH): 59 mg/dL (ref 0–99)
Triglycerides: 170 mg/dL — ABNORMAL HIGH (ref 0–149)
VLDL Cholesterol Cal: 28 mg/dL (ref 5–40)

## 2021-11-08 NOTE — Progress Notes (Signed)
Hello Payton,  Your lab result is normal and/or stable.Some minor variations that are not significant are commonly marked abnormal, but do not represent any medical problem for you.  Best regards, Gilverto Dileonardo, M.D.

## 2021-11-17 ENCOUNTER — Encounter: Payer: Self-pay | Admitting: Family Medicine

## 2021-11-17 ENCOUNTER — Ambulatory Visit (INDEPENDENT_AMBULATORY_CARE_PROVIDER_SITE_OTHER): Payer: Medicare Other | Admitting: Family Medicine

## 2021-11-17 VITALS — BP 128/78 | HR 86 | Temp 97.5°F | Ht 64.0 in | Wt 126.0 lb

## 2021-11-17 DIAGNOSIS — H9113 Presbycusis, bilateral: Secondary | ICD-10-CM

## 2021-11-17 NOTE — Progress Notes (Signed)
Subjective:  Patient ID: Debra Villarreal, female    DOB: February 17, 1932  Age: 88 y.o. MRN: [id number]  CC: DIABETIC LIPIDOSIS   HPI Debra Villarreal presents for  follow-up of hypertension. Patient has no history of headache chest pain or shortness of breath or recent cough. Patient also denies symptoms of TIA such as focal numbness or weakness. Patient denies side effects from medication. States taking it regularly.   in for follow-up of elevated cholesterol. Doing well without complaints on current medication. Denies side effects of statin including myalgia and arthralgia and nausea. Currently no chest pain, shortness of breath or other cardiovascular related symptoms noted.  presents forFollow-up of diabetes. Patient denies symptoms such as polyuria, polydipsia, excessive hunger, nausea No significant hypoglycemic spells noted. Medications reviewed. Pt reports taking them regularly without complication/adverse reaction being reported today.  Lab Results  Component Value Date   HGBA1C 5.4 11/16/2020   HGBA1C 5.4 01/02/2016        History Debra Villarreal has a past medical history of Anxiety, Arthritis, Bilateral cataracts, Hypertension, Takotsubo cardiomyopathy (2007), Type 2 diabetes mellitus ([place]), and Uterine cancer ([place]).   Debra Villarreal has a past surgical history that includes Abdominal hysterectomy; Appendectomy; Cataract extraction w/PHACO (10/12/2011); Cataract extraction w/PHACO (10/23/2011); Colonoscopy (01/31/2012); left heart catheterization with coronary angiogram (N/A, 02/09/2012); and Cholecystectomy (N/A, 11/19/2020).   Debra Villarreal family history includes Cancer in Debra Villarreal mother and sister; Heart attack in Debra Villarreal father.Debra Villarreal reports that Debra Villarreal has never smoked. Debra Villarreal has never used smokeless tobacco. Debra Villarreal reports that Debra Villarreal does not drink alcohol and does not use drugs.  Current Outpatient Medications on File Prior to Visit  Medication Sig Dispense Refill   acetaminophen (TYLENOL) 325 MG tablet Take 325 mg by  mouth every 6 (six) hours as needed for mild pain or moderate pain.      alendronate (FOSAMAX) 70 MG tablet Take 1 tablet (70 mg total) by mouth once a week. For bone health 13 tablet 3   aspirin EC 81 MG tablet Take 81 mg by mouth every 6 (six) hours as needed for moderate pain.     benazepril (LOTENSIN) 5 MG tablet Take 5 mg by mouth daily.     busPIRone (BUSPAR) 5 MG tablet Take 1 tablet (5 mg total) by mouth 2 (two) times daily. 60 tablet 11   Calcium Citrate-Vitamin D (CALCIUM + D PO) Take 1 tablet by mouth 2 (two) times daily.     Flaxseed, Linseed, 1000 MG CAPS Take by mouth.     meloxicam (MOBIC) 15 MG tablet Take 15 mg by mouth daily.     simvastatin (ZOCOR) 5 MG tablet Take 5 mg by mouth daily.     No current facility-administered medications on file prior to visit.    ROS Review of Systems  Constitutional: Negative.   HENT:  Positive for hearing loss (declines referral).   Eyes:  Negative for visual disturbance.  Respiratory:  Negative for shortness of breath.   Cardiovascular:  Negative for chest pain.  Gastrointestinal:  Negative for abdominal pain.  Musculoskeletal:  Negative for arthralgias.    Objective:  BP 128/78   Pulse 86   Temp (!) 97.5 F (36.4 C)   Ht '5\' 4"'$  (1.626 m)   Wt 126 lb (57.2 kg)   SpO2 98%   BMI 21.63 kg/m   BP Readings from Last 3 Encounters:  11/17/21 128/78  11/03/21 124/63  01/26/21 130/90    Wt Readings from Last 3 Encounters:  11/17/21 126 lb (57.2  kg)  11/03/21 128 lb (58.1 kg)  01/26/21 125 lb (56.7 kg)     Physical Exam Constitutional:      General: Debra Villarreal is not in acute distress.    Appearance: Debra Villarreal is well-developed.  Cardiovascular:     Rate and Rhythm: Normal rate and regular rhythm.  Pulmonary:     Breath sounds: Normal breath sounds.  Musculoskeletal:        General: Normal range of motion.  Skin:    General: Skin is warm and dry.  Neurological:     Mental Status: Debra Villarreal is alert and oriented to person, place, and  time.       Assessment & Plan:   Debra Villarreal was seen today for diabetic lipidosis.  Diagnoses and all orders for this visit:  Presbycusis of both ears   Allergies as of 11/17/2021   No Known Allergies      Medication List        Accurate as of November 17, 2021 11:59 PM. If you have any questions, ask your nurse or doctor.          STOP taking these medications    Fish Oil 1000 MG Caps Stopped by: Claretta Fraise, MD   FISH OIL PO Stopped by: Claretta Fraise, MD   metFORMIN 500 MG tablet Commonly known as: GLUCOPHAGE Stopped by: Claretta Fraise, MD       TAKE these medications    acetaminophen 325 MG tablet Commonly known as: TYLENOL Take 325 mg by mouth every 6 (six) hours as needed for mild pain or moderate pain.   alendronate 70 MG tablet Commonly known as: FOSAMAX Take 1 tablet (70 mg total) by mouth once a week. For bone health   aspirin EC 81 MG tablet Take 81 mg by mouth every 6 (six) hours as needed for moderate pain.   benazepril 5 MG tablet Commonly known as: LOTENSIN Take 5 mg by mouth daily.   busPIRone 5 MG tablet Commonly known as: BUSPAR Take 1 tablet (5 mg total) by mouth 2 (two) times daily.   CALCIUM + D PO Take 1 tablet by mouth 2 (two) times daily.   Flaxseed (Linseed) 1000 MG Caps Take by mouth.   meloxicam 15 MG tablet Commonly known as: MOBIC Take 15 mg by mouth daily.   simvastatin 5 MG tablet Commonly known as: ZOCOR Take 5 mg by mouth daily.        Follow-up: Return in about 6 months (around 05/19/2022).  Claretta Fraise, M.D.

## 2021-12-29 ENCOUNTER — Ambulatory Visit (INDEPENDENT_AMBULATORY_CARE_PROVIDER_SITE_OTHER): Payer: Medicare Other

## 2021-12-29 ENCOUNTER — Ambulatory Visit (INDEPENDENT_AMBULATORY_CARE_PROVIDER_SITE_OTHER): Payer: Medicare Other | Admitting: Family Medicine

## 2021-12-29 ENCOUNTER — Encounter: Payer: Self-pay | Admitting: Family Medicine

## 2021-12-29 ENCOUNTER — Ambulatory Visit: Payer: Medicare Other

## 2021-12-29 VITALS — BP 148/87 | HR 85 | Temp 98.4°F | Ht 64.0 in | Wt 124.8 lb

## 2021-12-29 DIAGNOSIS — M25561 Pain in right knee: Secondary | ICD-10-CM

## 2021-12-29 DIAGNOSIS — S0101XA Laceration without foreign body of scalp, initial encounter: Secondary | ICD-10-CM

## 2021-12-29 DIAGNOSIS — M7989 Other specified soft tissue disorders: Secondary | ICD-10-CM | POA: Diagnosis not present

## 2021-12-29 DIAGNOSIS — Z23 Encounter for immunization: Secondary | ICD-10-CM

## 2021-12-29 DIAGNOSIS — W19XXXA Unspecified fall, initial encounter: Secondary | ICD-10-CM | POA: Diagnosis not present

## 2021-12-29 DIAGNOSIS — M25461 Effusion, right knee: Secondary | ICD-10-CM

## 2021-12-29 NOTE — Addendum Note (Signed)
Addended by: Geryl Rankins D on: 12/29/2021 04:33 PM   Modules accepted: Orders

## 2021-12-29 NOTE — Progress Notes (Signed)
Subjective:  Patient ID: Debra Villarreal, female    DOB: 11-Feb-1932, 88 y.o.   MRN: [id number]  Patient Care Team: Claretta Fraise, MD as PCP - General (Family Medicine) Minus Breeding, MD as PCP - Cardiology (Cardiology) Daneil Dolin, MD (Gastroenterology)   Chief Complaint:  Fall   HPI: Debra Villarreal is a 88 y.o. female presenting on 12/29/2021 for Fall   Pt presents today for evaluation after a fall on Monday. States she slipped on an area rug in her kitchen causing her to fall. She states she hit her head on the corner of the refrigerator causing a wound. No LOC. States she is now having pain and swelling in her right knee. States the pain is aching and worse with walking and weight bearing. She has been wearing a brace which is slightly helpful.   Fall The accident occurred 3 to 5 days ago. The fall occurred while walking. She landed on Hard floor. The volume of blood lost was minimal. The point of impact was the right knee and head. The pain is present in the right knee. The pain is at a severity of 3/10. The pain is mild. The symptoms are aggravated by ambulation, pressure on injury, standing and use of injured limb. Pertinent negatives include no abdominal pain, bowel incontinence, fever, headaches, hearing loss, hematuria, loss of consciousness, nausea, numbness, tingling, visual change or vomiting. She has tried immobilization, rest, ice, NSAID and elevation for the symptoms. The treatment provided moderate relief.    Relevant past medical, surgical, family, and social history reviewed and updated as indicated.  Allergies and medications reviewed and updated. Data reviewed: Chart in Epic.   Past Medical History:  Diagnosis Date   Anxiety    Arthritis    Bilateral cataracts    Hypertension    Takotsubo cardiomyopathy 2007   Normary coronaries 2007, initial EF 18% improved to 60%    Type 2 diabetes mellitus ([place])    Uterine cancer [facility])     Past Surgical  History:  Procedure Laterality Date   ABDOMINAL HYSTERECTOMY     1966   APPENDECTOMY     CATARACT EXTRACTION W/PHACO  10/12/2011   Procedure: CATARACT EXTRACTION PHACO AND INTRAOCULAR LENS PLACEMENT ([place]);  Surgeon: Tonny Branch, MD;  Location: [facility] OR[facility];  Service: Ophthalmology;  Laterality: Left;  CDE:18.57   CATARACT EXTRACTION W/PHACO  10/23/2011   Procedure: CATARACT EXTRACTION PHACO AND INTRAOCULAR LENS PLACEMENT ([facility]);  Surgeon: Tonny Branch, MD;  Location: [facility] ORS;  Service: Ophthalmology;  Laterality: Right;  CDE 18.09   CHOLECYSTECTOMY N/A 11/19/2020   Procedure: LAPAROSCOPIC CHOLECYSTECTOMY;  Surgeon: Aviva Signs, MD;  Location: [facility] ORS;  Service: General;  Laterality: N/A;   COLONOSCOPY  01/31/2012   Procedure: COLONOSCOPY;  Surgeon: Daneil Dolin, MD;  Location: [facility] ENDO SUITE;  Service: Endoscopy;  Laterality: N/A;  1:45   LEFT HEART CATHETERIZATION WITH CORONARY ANGIOGRAM N/A 02/09/2012   Procedure: LEFT HEART CATHETERIZATION WITH CORONARY ANGIOGRAM;  Surgeon: Thayer Headings, MD;  Location[facility] CATH LAB;  Service: Cardiovascular;  Laterality: N/A;    Social History   Socioeconomic History   Marital status: Widowed    Spouse name: Not on file   Number of children: Not on file   Years of education: Not on file   Highest education level: Not on file  Occupational History   Not on file  Tobacco Use   Smoking status: Never   Smokeless tobacco: Never  Vaping Use  Vaping Use: Never used  Substance and Sexual Activity   Alcohol use: No   Drug use: No   Sexual activity: Not on file  Other Topics Concern   Not on file  Social History Narrative   Not on file   Social Determinants of Health   Financial Resource Strain: Not on file  Food Insecurity: Not on file  Transportation Needs: Not on file  Physical Activity: Not on file  Stress: Not on file  Social Connections: Not on file  Intimate Partner Violence: Not on file    Outpatient Encounter Medications as of 12/29/2021   Medication Sig   acetaminophen (TYLENOL) 325 MG tablet Take 325 mg by mouth every 6 (six) hours as needed for mild pain or moderate pain.    alendronate (FOSAMAX) 70 MG tablet Take 1 tablet (70 mg total) by mouth once a week. For bone health   aspirin EC 81 MG tablet Take 81 mg by mouth every 6 (six) hours as needed for moderate pain.   benazepril (LOTENSIN) 5 MG tablet Take 5 mg by mouth daily.   busPIRone (BUSPAR) 5 MG tablet Take 1 tablet (5 mg total) by mouth 2 (two) times daily.   Calcium Citrate-Vitamin D (CALCIUM + D PO) Take 1 tablet by mouth 2 (two) times daily.   Flaxseed, Linseed, 1000 MG CAPS Take by mouth.   meloxicam (MOBIC) 15 MG tablet Take 15 mg by mouth daily.   simvastatin (ZOCOR) 5 MG tablet Take 5 mg by mouth daily.   No facility-administered encounter medications on file as of 12/29/2021.    No Known Allergies  Review of Systems  Constitutional:  Negative for activity change, appetite change, chills, diaphoresis, fatigue, fever and unexpected weight change.  Eyes:  Negative for photophobia and visual disturbance.  Respiratory:  Negative for cough and shortness of breath.   Cardiovascular:  Negative for chest pain, palpitations and leg swelling.  Gastrointestinal:  Negative for abdominal distention, abdominal pain, bowel incontinence, constipation, nausea and vomiting.  Endocrine: Negative for polydipsia, polyphagia and polyuria.  Genitourinary:  Negative for decreased urine volume, difficulty urinating and hematuria.  Musculoskeletal:  Positive for arthralgias and joint swelling. Negative for back pain, gait problem, myalgias, neck pain and neck stiffness.  Skin:  Positive for wound. Negative for color change, pallor and rash.  Neurological:  Negative for dizziness, tingling, tremors, seizures, loss of consciousness, syncope, facial asymmetry, speech difficulty, weakness, light-headedness, numbness and headaches.  Psychiatric/Behavioral:  Negative for agitation,  behavioral problems, confusion, decreased concentration, dysphoric mood, hallucinations, self-injury, sleep disturbance and suicidal ideas. The patient is not nervous/anxious and is not hyperactive.   All other systems reviewed and are negative.       Objective:  BP (!) 148/87   Pulse 85   Temp 98.4 F (36.9 C)   Ht '5\' 4"'  (1.626 m)   Wt 124 lb 12.8 oz (56.6 kg)   SpO2 92%   BMI 21.42 kg/m    Wt Readings from Last 3 Encounters:  12/29/21 124 lb 12.8 oz (56.6 kg)  11/17/21 126 lb (57.2 kg)  11/03/21 128 lb (58.1 kg)    Physical Exam Vitals and nursing note reviewed.  Constitutional:      General: She is not in acute distress.    Appearance: Normal appearance. She is normal weight. She is not ill-appearing, toxic-appearing or diaphoretic.  HENT:     Head: Normocephalic.      Mouth/Throat:     Mouth: Mucous membranes are moist.  Eyes:     Conjunctiva/sclera: Conjunctivae normal.     Pupils: Pupils are equal, round, and reactive to light.  Cardiovascular:     Rate and Rhythm: Normal rate and regular rhythm.     Heart sounds: Normal heart sounds.  Pulmonary:     Effort: Pulmonary effort is normal.     Breath sounds: Normal breath sounds.  Musculoskeletal:     Right upper leg: Normal.     Left upper leg: Normal.     Right knee: Swelling present. No deformity, effusion, erythema, ecchymosis, lacerations, bony tenderness or crepitus. Normal range of motion. Tenderness present over the medial joint line. No lateral joint line, MCL, LCL, ACL, PCL or patellar tendon tenderness. No LCL laxity, MCL laxity, ACL laxity or PCL laxity. Normal alignment, normal meniscus and normal patellar mobility. Normal pulse.     Instability Tests: Anterior drawer test negative. Posterior drawer test negative. Anterior Lachman test negative. Medial McMurray test negative and lateral McMurray test negative.     Left knee: Normal.     Right lower leg: Normal. No edema.     Left lower leg: Normal. No  edema.       Legs:  Skin:    General: Skin is warm and dry.     Capillary Refill: Capillary refill takes less than 2 seconds.  Neurological:     General: No focal deficit present.     Mental Status: She is alert. Mental status is at baseline.  Psychiatric:        Mood and Affect: Mood normal.        Behavior: Behavior normal.        Thought Content: Thought content normal.        Judgment: Judgment normal.     Results for orders placed or performed in visit on 11/03/21  CBC with Differential/Platelet  Result Value Ref Range   WBC 7.0 3.4 - 10.8 x10E3/uL   RBC 4.35 3.77 - 5.28 x10E6/uL   Hemoglobin 12.6 11.1 - 15.9 g/dL   Hematocrit 38.3 34.0 - 46.6 %   MCV 88 79 - 97 fL   MCH 29.0 26.6 - 33.0 pg   MCHC 32.9 31.5 - 35.7 g/dL   RDW 12.4 11.7 - 15.4 %   Platelets 232 150 - 450 x10E3/uL   Neutrophils 55 Not Estab. %   Lymphs 33 Not Estab. %   Monocytes 11 Not Estab. %   Eos 1 Not Estab. %   Basos 0 Not Estab. %   Neutrophils Absolute 3.9 1.4 - 7.0 x10E3/uL   Lymphocytes Absolute 2.3 0.7 - 3.1 x10E3/uL   Monocytes Absolute 0.7 0.1 - 0.9 x10E3/uL   EOS (ABSOLUTE) 0.1 0.0 - 0.4 x10E3/uL   Basophils Absolute 0.0 0.0 - 0.2 x10E3/uL   Immature Granulocytes 0 Not Estab. %   Immature Grans (Abs) 0.0 0.0 - 0.1 x10E3/uL  CMP14+EGFR  Result Value Ref Range   Glucose 94 70 - 99 mg/dL   BUN 30 (H) 8 - 27 mg/dL   Creatinine, Ser 1.07 (H) 0.57 - 1.00 mg/dL   eGFR 50 (L) >59 mL/min/1.73   BUN/Creatinine Ratio 28 12 - 28   Sodium 139 134 - 144 mmol/L   Potassium 4.7 3.5 - 5.2 mmol/L   Chloride 100 96 - 106 mmol/L   CO2 23 20 - 29 mmol/L   Calcium 10.2 8.7 - 10.3 mg/dL   Total Protein 7.3 6.0 - 8.5 g/dL   Albumin 4.3 3.6 - 4.6 g/dL  Globulin, Total 3.0 1.5 - 4.5 g/dL   Albumin/Globulin Ratio 1.4 1.2 - 2.2   Bilirubin Total <0.2 0.0 - 1.2 mg/dL   Alkaline Phosphatase 65 44 - 121 IU/L   AST 18 0 - 40 IU/L   ALT 9 0 - 32 IU/L  Lipid panel  Result Value Ref Range   Cholesterol,  Total 145 100 - 199 mg/dL   Triglycerides 170 (H) 0 - 149 mg/dL   HDL 58 >39 mg/dL   VLDL Cholesterol Cal 28 5 - 40 mg/dL   LDL Chol Calc (NIH) 59 0 - 99 mg/dL   Chol/HDL Ratio 2.5 0.0 - 4.4 ratio  TSH + free T4  Result Value Ref Range   TSH 0.662 0.450 - 4.500 uIU/mL   Free T4 1.57 0.82 - 1.77 ng/dL     X-Ray: right knee: osteoarthritis, osteopenia noted. No acute findings. Preliminary x-ray reading by Monia Pouch, FNP-C, [facility].   Pertinent labs & imaging results that were available during my care of the patient were reviewed by me and considered in my medical decision making.  Assessment & Plan:  [place] was seen today for fall.  Diagnoses and all orders for this visit:  Fall, initial encounter Fall precautions discussed in detail. Pt aware to remove area rugs from home.   Pain and swelling of right knee No acute findings on imaging. Noted osteopenia and ostearthritis. Knee brace applied in office with instructions on proper use. RICE therapy discussed in detail. Report new, worsening, or persistent symptoms. Continue Mobic as prescribed.  -     DG Knee 1-2 Views Right  Laceration of scalp, initial encounter No repair needed or warranted as wound is 33 days old. No signs of infection. Wound care discussed in detail. Tetanus updated today. Report new, worsening, or persistent symptoms.     Continue all other maintenance medications.  Follow up plan: Return in about 6 weeks (around 02/09/2022), or if symptoms worsen or fail to improve, for knee pain.   Continue healthy lifestyle choices, including diet (rich in fruits, vegetables, and lean proteins, and low in salt and simple carbohydrates) and exercise (at least 30 minutes of moderate physical activity daily).  Educational handout given for knee sprain, wound care  The above assessment and management plan was discussed with the patient. The patient verbalized understanding of and has agreed to the management plan. Patient  is aware to call the clinic if they develop any new symptoms or if symptoms persist or worsen. Patient is aware when to return to the clinic for a follow-up visit. Patient educated on when it is appropriate to go to the emergency department.   Monia Pouch, FNP-C [facility] [phone]

## 2022-01-27 ENCOUNTER — Other Ambulatory Visit: Payer: Self-pay | Admitting: Family Medicine

## 2022-01-27 NOTE — Telephone Encounter (Signed)
  Prescription Request  01/27/2022  Is this a "Controlled Substance" medicine? no  Have you seen your PCP in the last 2 weeks? no  If YES, route message to pool  -  If NO, patient needs to be scheduled for appointment.  What is the name of the medication or equipment? Benazepril-'5mg'$   Have you contacted your pharmacy to request a refill? yes   Which pharmacy would you like this sent to? Mail order-[facility]?-maybe   Patient notified that their request is being sent to the clinical staff for review and that they should receive a response within 2 business days.

## 2022-01-29 MED ORDER — BENAZEPRIL HCL 5 MG PO TABS: 5.0000 mg | ORAL_TABLET | Freq: Every day | ORAL | 1 refills | Status: DC

## 2022-01-30 NOTE — Telephone Encounter (Signed)
LMOVM, this was a medication we had not filled yet had to send it to PCP, he sent it it to the pharmacy.

## 2022-02-08 ENCOUNTER — Encounter: Payer: Self-pay | Admitting: Family Medicine

## 2022-02-08 ENCOUNTER — Other Ambulatory Visit: Payer: Self-pay | Admitting: Family Medicine

## 2022-02-08 ENCOUNTER — Telehealth: Payer: Self-pay | Admitting: Family Medicine

## 2022-02-08 ENCOUNTER — Ambulatory Visit (INDEPENDENT_AMBULATORY_CARE_PROVIDER_SITE_OTHER): Payer: Medicare Other | Admitting: Family Medicine

## 2022-02-08 VITALS — BP 128/55 | HR 80 | Temp 97.4°F | Ht 64.0 in | Wt 123.8 lb

## 2022-02-08 DIAGNOSIS — M25461 Effusion, right knee: Secondary | ICD-10-CM

## 2022-02-08 DIAGNOSIS — M25561 Pain in right knee: Secondary | ICD-10-CM | POA: Diagnosis not present

## 2022-02-08 DIAGNOSIS — E1169 Type 2 diabetes mellitus with other specified complication: Secondary | ICD-10-CM

## 2022-02-08 MED ORDER — GLUCOSE BLOOD VI STRP: ORAL_STRIP | 12 refills | Status: AC

## 2022-02-08 MED ORDER — ONETOUCH ULTRASOFT LANCETS MISC: 12 refills | Status: AC

## 2022-02-08 NOTE — Progress Notes (Signed)
Chief Complaint  Patient presents with   Follow-up    HPI  Patient presents today for right leg wound recheck. [place] last month hurting the right knee. Seen on 7/20. Now back to normal routine. No pain Walking normally.   Not monitoring glucose.  PMH: Smoking status noted ROS: Per HPI  Objective: BP (!) 128/55   Pulse 80   Temp (!) 97.4 F (36.3 C)   Ht '5\' 4"'$  (1.626 m)   Wt 123 lb 12.8 oz (56.2 kg)   SpO2 98%   BMI 21.25 kg/m  Gen: NAD, alert, cooperative with exam HEENT: NCAT, EOMI, PERRL CV: RRR, good S1/S2, no murmur Resp: CTABL, no wheezes, non-labored Ext: No edema, warm, nontender, FROM Neuro: Alert and oriented, No gross deficits  Assessment and plan:  1. Pain and swelling of right knee     Encouraged pt. To monitor glucose at home. Let me know the brand of strips and I will write a prescription for them    Follow up as needed for this illness. Dec 11 appt. Has been set for next DM check.Claretta Fraise, MD

## 2022-02-08 NOTE — Telephone Encounter (Signed)
Isent in the scrips, however, my computer says they are not covered. Check with pharmacy. If they are not covered, the pharmacist may help you find out which brands are covered.

## 2022-02-08 NOTE — Telephone Encounter (Signed)
Daughter is calling about the needles and strips for her One Touch, she said the date was 2017 on both and would like for more to be called in. Patient was seen today (8/30) and was told to let Dr. Livia Snellen know so that he could call in a new prescription.

## 2022-02-09 ENCOUNTER — Telehealth: Payer: Self-pay | Admitting: Family Medicine

## 2022-02-09 NOTE — Telephone Encounter (Signed)
Returned call, no answer, left message to call back.

## 2022-02-09 NOTE — Telephone Encounter (Signed)
DUPLICATE MESSAGE

## 2022-02-09 NOTE — Telephone Encounter (Signed)
lmtcb

## 2022-02-15 NOTE — Telephone Encounter (Signed)
RETURNED CALL, NO ANSWER, LEFT MESSAGE TO RETURN CALL 

## 2022-02-16 NOTE — Telephone Encounter (Signed)
CALLED PATIENT, NO ANSWER °

## 2022-03-21 ENCOUNTER — Telehealth: Payer: Self-pay | Admitting: Cardiology

## 2022-03-21 NOTE — Telephone Encounter (Signed)
Called patient to schedule recall. She states she does not want to schedule.

## 2022-04-06 ENCOUNTER — Other Ambulatory Visit: Payer: Self-pay | Admitting: Family Medicine

## 2022-04-06 ENCOUNTER — Telehealth: Payer: Self-pay | Admitting: Family Medicine

## 2022-04-06 NOTE — Telephone Encounter (Signed)
Pt request for Simvastatin RF to go to [facility]. First OV 11/03/21 notes RE: simvastatin allergy, Next OV 05/22/22 Please advise

## 2022-04-07 ENCOUNTER — Other Ambulatory Visit: Payer: Self-pay | Admitting: Family Medicine

## 2022-04-07 MED ORDER — SIMVASTATIN 5 MG PO TABS: 5.0000 mg | ORAL_TABLET | Freq: Every day | ORAL | 3 refills | Status: AC

## 2022-04-07 NOTE — Telephone Encounter (Signed)
Please let the patient know that I sent their prescription to their pharmacy. Thanks, WS 

## 2022-05-08 ENCOUNTER — Ambulatory Visit (INDEPENDENT_AMBULATORY_CARE_PROVIDER_SITE_OTHER): Payer: Medicare Other

## 2022-05-08 VITALS — Ht 64.0 in | Wt 125.0 lb

## 2022-05-08 DIAGNOSIS — Z0000 Encounter for general adult medical examination without abnormal findings: Secondary | ICD-10-CM | POA: Diagnosis not present

## 2022-05-08 NOTE — Progress Notes (Signed)
Subjective:   Debra Villarreal is a 87 y.o. female who presents for Medicare Annual (Subsequent) preventive examination. I connected with  Gracy Bruins on 05/08/22 by a audio enabled telemedicine application and verified that I am speaking with the correct person using two identifiers.  Patient Location: Home  Provider Location: Home Office  I discussed the limitations of evaluation and management by telemedicine. The patient expressed understanding and agreed to proceed.  Review of Systems    Cardiac Risk Factors include: advanced age (>38mn, >>46women);hypertension     Objective:    Today's Vitals   05/08/22 1045  Weight: 125 lb (56.7 kg)  Height: '5\' 4"'$  (1.626 m)   Body mass index is 21.46 kg/m.     05/08/2022   10:48 AM 11/16/2020    1:33 PM 05/14/2015    5:46 AM 02/08/2012    5:32 PM 01/31/2012   12:59 PM 10/23/2011    9:16 AM 10/05/2011    2:31 PM  Advanced Directives  Does Patient Have a Medical Advance Directive? No No Yes Patient has advance directive, copy not in chart Patient has advance directive, copy not in chart  Patient does not have advance directive;Patient would not like information  Type of Advance Directive    Living will;Healthcare Power of ARed Level   Would patient like information on creating a medical advance directive? No - Patient declined        Pre-existing out of facility DNR order (yellow form or pink MOST form)     No No No    Current Medications (verified) Outpatient Encounter Medications as of 05/08/2022  Medication Sig   acetaminophen (TYLENOL) 325 MG tablet Take 325 mg by mouth every 6 (six) hours as needed for mild pain or moderate pain.    alendronate (FOSAMAX) 70 MG tablet Take 1 tablet (70 mg total) by mouth once a week. For bone health   aspirin EC 81 MG tablet Take 81 mg by mouth every 6 (six) hours as needed for moderate pain.   benazepril (LOTENSIN) 5 MG tablet TAKE 1 TABLET BY MOUTH DAILY    busPIRone (BUSPAR) 5 MG tablet Take 1 tablet (5 mg total) by mouth 2 (two) times daily.   Calcium Citrate-Vitamin D (CALCIUM + D PO) Take 1 tablet by mouth 2 (two) times daily.   Flaxseed, Linseed, 1000 MG CAPS Take by mouth.   glucose blood test strip Use as instructed   Lancets (ONETOUCH ULTRASOFT) lancets Use as instructed   meloxicam (MOBIC) 15 MG tablet Take 15 mg by mouth daily.   simvastatin (ZOCOR) 5 MG tablet Take 1 tablet (5 mg total) by mouth daily. Take 5 mg by mouth daily.   No facility-administered encounter medications on file as of 05/08/2022.    Allergies (verified) Patient has no known allergies.   History: Past Medical History:  Diagnosis Date   Anxiety    Arthritis    Bilateral cataracts    Hypertension    Takotsubo cardiomyopathy 2007   Normary coronaries 2007, initial EF 18% improved to 60%    Type 2 diabetes mellitus ([facility]    Uterine cancer ([facility]    Past Surgical History:  Procedure Laterality Date   ABDOMINAL HYSTERECTOMY     1966   APPENDECTOMY     CATARACT EXTRACTION W/PHACO  10/12/2011   Procedure: CATARACT EXTRACTION PHACO AND INTRAOCULAR LENS PLACEMENT ([facility];  Surgeon: KTonny Branch MD;  Location: [facility];  Service: Ophthalmology;  Laterality:  Left;  CDE:18.57   CATARACT EXTRACTION W/PHACO  10/23/2011   Procedure: CATARACT EXTRACTION PHACO AND INTRAOCULAR LENS PLACEMENT ([facility]);  Surgeon: Tonny Branch, MD;  Location: [facility];  Service: Ophthalmology;  Laterality: Right;  CDE 18.09   CHOLECYSTECTOMY N/A 11/19/2020   Procedure: LAPAROSCOPIC CHOLECYSTECTOMY;  Surgeon: Aviva Signs, MD;  Location: [facility];  Service: General;  Laterality: N/A;   COLONOSCOPY  01/31/2012   Procedure: COLONOSCOPY;  Surgeon: Daneil Dolin, MD;  Location: [facility] SUITE;  Service: Endoscopy;  Laterality: N/A;  1:45   LEFT HEART CATHETERIZATION WITH CORONARY ANGIOGRAM N/A 02/09/2012   Procedure: LEFT HEART CATHETERIZATION WITH CORONARY ANGIOGRAM;  Surgeon: Thayer Headings, MD;  Location:  [facility] CATH LAB;  Service: Cardiovascular;  Laterality: N/A;   Family History  Problem Relation Age of Onset   Cancer Mother        Ovarian   Cancer Sister        Breast   Heart attack Father    Colon cancer Neg Hx    Anesthesia problems Neg Hx    Hypotension Neg Hx    Malignant hyperthermia Neg Hx    Pseudochol deficiency Neg Hx    Social History   Socioeconomic History   Marital status: Widowed    Spouse name: Not on file   Number of children: Not on file   Years of education: Not on file   Highest education level: Not on file  Occupational History   Not on file  Tobacco Use   Smoking status: Never   Smokeless tobacco: Never  Vaping Use   Vaping Use: Never used  Substance and Sexual Activity   Alcohol use: No   Drug use: No   Sexual activity: Not on file  Other Topics Concern   Not on file  Social History Narrative   Not on file   Social Determinants of Health   Financial Resource Strain: Low Risk  (05/08/2022)   Overall Financial Resource Strain (CARDIA)    Difficulty of Paying Living Expenses: Not hard at all  Food Insecurity: No Food Insecurity (05/08/2022)   Hunger Vital Sign    Worried About Running Out of Food in the Last Year: Never true    [place] in the Last Year: Never true  Transportation Needs: No Transportation Needs (05/08/2022)   PRAPARE - Hydrologist (Medical): No    Lack of Transportation (Non-Medical): No  Physical Activity: Insufficiently Active (05/08/2022)   Exercise Vital Sign    Days of Exercise per Week: 3 days    Minutes of Exercise per Session: 30 min  Stress: No Stress Concern Present (05/08/2022)   [place]    Feeling of Stress : Not at all  Social Connections: Moderately Isolated (05/08/2022)   Social Connection and Isolation Panel [NHANES]    Frequency of Communication with Friends and Family: More than three times a week     Frequency of Social Gatherings with Friends and Family: More than three times a week    Attends Religious Services: 1 to 4 times per year    Active Member of [employer] or Organizations: No    Attends Archivist Meetings: Never    Marital Status: Widowed    Tobacco Counseling Counseling given: Not Answered   Clinical Intake:  Pre-visit preparation completed: Yes  Pain : No/denies pain     Nutritional Risks: None Diabetes: No  How often do  you need to have someone help you when you read instructions, pamphlets, or other written materials from your doctor or pharmacy?: 1 - Never  Diabetic?no   Interpreter Needed?: No  Information entered by :: Jadene Pierini, LPN   Activities of Daily Living    05/08/2022   10:48 AM  In your present state of health, do you have any difficulty performing the following activities:  Hearing? 0  Vision? 0  Difficulty concentrating or making decisions? 0  Walking or climbing stairs? 0  Dressing or bathing? 0  Doing errands, shopping? 0  Preparing Food and eating ? N  Using the Toilet? N  In the past six months, have you accidently leaked urine? N  Do you have problems with loss of bowel control? N  Managing your Medications? N  Managing your Finances? Y  Housekeeping or managing your Housekeeping? Y    Patient Care Team: Claretta Fraise, MD as PCP - General (Family Medicine) Minus Breeding, MD as PCP - Cardiology (Cardiology) Gala Romney Cristopher Estimable, MD (Gastroenterology)  Indicate any recent Medical Services you may have received from other than [facility] providers in the past year (date may be approximate).     Assessment:   This is a routine wellness examination for Debra Villarreal.  Hearing/Vision screen Vision Screening - Comments:: Wears rx glasses - up to date with routine eye exams with  Dr.Lee  Dietary issues and exercise activities discussed: Current Exercise Habits: The patient does not participate in regular exercise at present,  Exercise limited by: orthopedic condition(s)   Goals Addressed             This Visit's Progress    Exercise 3x per week (30 min per time)         Depression Screen    05/08/2022   10:47 AM 02/08/2022    8:25 AM 12/29/2021    9:32 AM 11/17/2021    3:20 PM 11/03/2021    2:38 PM  PHQ 2/9 Scores  PHQ - 2 Score 0 0 0 0 0  PHQ- 9 Score   0 0     Fall Risk    05/08/2022   10:46 AM 02/08/2022    8:24 AM 12/29/2021    9:34 AM 11/17/2021    3:20 PM 11/03/2021    2:38 PM  [place] in the past year? 0 1 1 0 0  Number falls in past yr: 0 0 0    Injury with Fall? 0 1     Risk for fall due to : No Fall Risks History of fall(s);Impaired mobility Impaired mobility    Follow up Falls prevention discussed Falls evaluation completed       FALL RISK PREVENTION PERTAINING TO THE HOME:  Any stairs in or around the home? No  If so, are there any without handrails? No  Home free of loose throw rugs in walkways, pet beds, electrical cords, etc? Yes  Adequate lighting in your home to reduce risk of falls? Yes   ASSISTIVE DEVICES UTILIZED TO PREVENT FALLS:  Life alert? Yes  Use of a cane, walker or w/c? Yes  Grab bars in the bathroom? Yes  Shower chair or bench in shower? Yes  Elevated toilet seat or a handicapped toilet? Yes          05/08/2022   10:49 AM  6CIT Screen  What Year? 0 points  What month? 0 points  What time? 0 points  Count back from 20 0 points  Months in reverse 0 points  Repeat phrase 0 points  Total Score 0 points    Immunizations Immunization History  Administered Date(s) Administered   Influenza Split 02/26/2012   Influenza, High Dose Seasonal PF 02/24/2019   Influenza,inj,Quad PF,6+ Mos 03/17/2016   Influenza-Unspecified 04/30/2019   Td 12/29/2021   Tdap 01/17/2013    TDAP status: Up to date  Flu Vaccine status: Up to date  Pneumococcal vaccine status: Due, Education has been provided regarding the importance of this vaccine. Advised may  receive this vaccine at local pharmacy or Health Dept. Aware to provide a copy of the vaccination record if obtained from local pharmacy or Health Dept. Verbalized acceptance and understanding.  Covid-19 vaccine status: Completed vaccines  Qualifies for Shingles Vaccine? Yes   Zostavax completed No   Shingrix Completed?: No.    Education has been provided regarding the importance of this vaccine. Patient has been advised to call insurance company to determine out of pocket expense if they have not yet received this vaccine. Advised may also receive vaccine at local pharmacy or Health Dept. Verbalized acceptance and understanding.  Screening Tests Health Maintenance  Topic Date Due   COVID-19 Vaccine (1) Never done   Zoster Vaccines- Shingrix (1 of 2) Never done   Pneumonia Vaccine 75+ Years old (1 - PCV) Never done   INFLUENZA VACCINE  01/10/2022   Medicare Annual Wellness (AWV)  05/09/2023   DEXA SCAN  Completed   HPV VACCINES  Aged Out    Health Maintenance  Health Maintenance Due  Topic Date Due   COVID-19 Vaccine (1) Never done   Zoster Vaccines- Shingrix (1 of 2) Never done   Pneumonia Vaccine 13+ Years old (1 - PCV) Never done   INFLUENZA VACCINE  01/10/2022    Colorectal cancer screening: No longer required.   Mammogram status: No longer required due to age.  Bone Density status: Completed 11/12/2018. Results reflect: Bone density results: OSTEOPENIA. Repeat every 5 years.  Lung Cancer Screening: (Low Dose CT Chest recommended if Age 15-80 years, 30 pack-year currently smoking OR have quit w/in 15years.) does not qualify.   Lung Cancer Screening Referral: n/a  Additional Screening:  Hepatitis C Screening: does not qualify;   Vision Screening: Recommended annual ophthalmology exams for early detection of glaucoma and other disorders of the eye. Is the patient up to date with their annual eye exam?  Yes  Who is the provider or what is the name of the office in which  the patient attends annual eye exams? Dr.Lee  If pt is not established with a provider, would they like to be referred to a provider to establish care? No .   Dental Screening: Recommended annual dental exams for proper oral hygiene  Community Resource Referral / Chronic Care Management: CRR required this visit?  No   CCM required this visit?  No      Plan:     I have personally reviewed and noted the following in the patient's chart:   Medical and social history Use of alcohol, tobacco or illicit drugs  Current medications and supplements including opioid prescriptions. Patient is not currently taking opioid prescriptions. Functional ability and status Nutritional status Physical activity Advanced directives List of other physicians Hospitalizations, surgeries, and ER visits in previous 12 months Vitals Screenings to include cognitive, depression, and falls Referrals and appointments  In addition, I have reviewed and discussed with patient certain preventive protocols, quality metrics, and best practice recommendations. A written personalized care  plan for preventive services as well as general preventive health recommendations were provided to patient.     Daphane Shepherd, LPN   [id number]   Nurse Notes: Due Pneumonia Vaccine

## 2022-05-08 NOTE — Patient Instructions (Signed)
Debra Villarreal , Thank you for taking time to come for your Medicare Wellness Visit. I appreciate your ongoing commitment to your health goals. Please review the following plan we discussed and let me know if I can assist you in the future.   These are the goals we discussed:  Goals      Exercise 3x per week (30 min per time)        This is a list of the screening recommended for you and due dates:  Health Maintenance  Topic Date Due   COVID-19 Vaccine (1) Never done   Zoster (Shingles) Vaccine (1 of 2) Never done   Pneumonia Vaccine (1 - PCV) Never done   Flu Shot  01/10/2022   Medicare Annual Wellness Visit  05/09/2023   DEXA scan (bone density measurement)  Completed   HPV Vaccine  Aged Out    Advanced directives: Advance directive discussed with you today. I have provided a copy for you to complete at home and have notarized. Once this is complete please bring a copy in to our office so we can scan it into your chart.   Conditions/risks identified: Aim for 30 minutes of exercise or brisk walking, 6-8 glasses of water, and 5 servings of fruits and vegetables each day.   Next appointment: Follow up in one year for your annual wellness visit    Preventive Care 65 Years and Older, Female Preventive care refers to lifestyle choices and visits with your health care provider that can promote health and wellness. What does preventive care include? A yearly physical exam. This is also called an annual well check. Dental exams once or twice a year. Routine eye exams. Ask your health care provider how often you should have your eyes checked. Personal lifestyle choices, including: Daily care of your teeth and gums. Regular physical activity. Eating a healthy diet. Avoiding tobacco and drug use. Limiting alcohol use. Practicing safe sex. Taking low-dose aspirin every day. Taking vitamin and mineral supplements as recommended by your health care provider. What happens during an annual  well check? The services and screenings done by your health care provider during your annual well check will depend on your age, overall health, lifestyle risk factors, and family history of disease. Counseling  Your health care provider may ask you questions about your: Alcohol use. Tobacco use. Drug use. Emotional well-being. Home and relationship well-being. Sexual activity. Eating habits. History of falls. Memory and ability to understand (cognition). Work and work Statistician. Reproductive health. Screening  You may have the following tests or measurements: Height, weight, and BMI. Blood pressure. Lipid and cholesterol levels. These may be checked every 5 years, or more frequently if you are over 46 years old. Skin check. Lung cancer screening. You may have this screening every year starting at age 67 if you have a 30-pack-year history of smoking and currently smoke or have quit within the past 15 years. Fecal occult blood test (FOBT) of the stool. You may have this test every year starting at age 2. Flexible sigmoidoscopy or colonoscopy. You may have a sigmoidoscopy every 5 years or a colonoscopy every 10 years starting at age 86. Hepatitis C blood test. Hepatitis B blood test. Sexually transmitted disease (STD) testing. Diabetes screening. This is done by checking your blood sugar (glucose) after you have not eaten for a while (fasting). You may have this done every 1-3 years. Bone density scan. This is done to screen for osteoporosis. You may have this done starting  at age 86. Mammogram. This may be done every 1-2 years. Talk to your health care provider about how often you should have regular mammograms. Talk with your health care provider about your test results, treatment options, and if necessary, the need for more tests. Vaccines  Your health care provider may recommend certain vaccines, such as: Influenza vaccine. This is recommended every year. Tetanus, diphtheria,  and acellular pertussis (Tdap, Td) vaccine. You may need a Td booster every 10 years. Zoster vaccine. You may need this after age 86. Pneumococcal 13-valent conjugate (PCV13) vaccine. One dose is recommended after age 86. Pneumococcal polysaccharide (PPSV23) vaccine. One dose is recommended after age 86. Talk to your health care provider about which screenings and vaccines you need and how often you need them. This information is not intended to replace advice given to you by your health care provider. Make sure you discuss any questions you have with your health care provider. Document Released: 06/25/2015 Document Revised: 02/16/2016 Document Reviewed: 03/30/2015 Elsevier Interactive Patient Education  2017 [place] Prevention in the Home Falls can cause injuries. They can happen to people of all ages. There are many things you can do to make your home safe and to help prevent falls. What can I do on the outside of my home? Regularly fix the edges of walkways and driveways and fix any cracks. Remove anything that might make you trip as you walk through a door, such as a raised step or threshold. Trim any bushes or trees on the path to your home. Use bright outdoor lighting. Clear any walking paths of anything that might make someone trip, such as rocks or tools. Regularly check to see if handrails are loose or broken. Make sure that both sides of any steps have handrails. Any raised decks and porches should have guardrails on the edges. Have any leaves, snow, or ice cleared regularly. Use sand or salt on walking paths during winter. Clean up any spills in your garage right away. This includes oil or grease spills. What can I do in the bathroom? Use night lights. Install grab bars by the toilet and in the tub and shower. Do not use towel bars as grab bars. Use non-skid mats or decals in the tub or shower. If you need to sit down in the shower, use a plastic, non-slip  stool. Keep the floor dry. Clean up any water that spills on the floor as soon as it happens. Remove soap buildup in the tub or shower regularly. Attach bath mats securely with double-sided non-slip rug tape. Do not have throw rugs and other things on the floor that can make you trip. What can I do in the bedroom? Use night lights. Make sure that you have a light by your bed that is easy to reach. Do not use any sheets or blankets that are too big for your bed. They should not hang down onto the floor. Have a firm chair that has side arms. You can use this for support while you get dressed. Do not have throw rugs and other things on the floor that can make you trip. What can I do in the kitchen? Clean up any spills right away. Avoid walking on wet floors. Keep items that you use a lot in easy-to-reach places. If you need to reach something above you, use a strong step stool that has a grab bar. Keep electrical cords out of the way. Do not use floor polish or wax that makes  floors slippery. If you must use wax, use non-skid floor wax. Do not have throw rugs and other things on the floor that can make you trip. What can I do with my stairs? Do not leave any items on the stairs. Make sure that there are handrails on both sides of the stairs and use them. Fix handrails that are broken or loose. Make sure that handrails are as long as the stairways. Check any carpeting to make sure that it is firmly attached to the stairs. Fix any carpet that is loose or worn. Avoid having throw rugs at the top or bottom of the stairs. If you do have throw rugs, attach them to the floor with carpet tape. Make sure that you have a light switch at the top of the stairs and the bottom of the stairs. If you do not have them, ask someone to add them for you. What else can I do to help prevent falls? Wear shoes that: Do not have high heels. Have rubber bottoms. Are comfortable and fit you well. Are closed at the  toe. Do not wear sandals. If you use a stepladder: Make sure that it is fully opened. Do not climb a closed stepladder. Make sure that both sides of the stepladder are locked into place. Ask someone to hold it for you, if possible. Clearly mark and make sure that you can see: Any grab bars or handrails. First and last steps. Where the edge of each step is. Use tools that help you move around (mobility aids) if they are needed. These include: Canes. Walkers. Scooters. Crutches. Turn on the lights when you go into a dark area. Replace any light bulbs as soon as they burn out. Set up your furniture so you have a clear path. Avoid moving your furniture around. If any of your floors are uneven, fix them. If there are any pets around you, be aware of where they are. Review your medicines with your doctor. Some medicines can make you feel dizzy. This can increase your chance of falling. Ask your doctor what other things that you can do to help prevent falls. This information is not intended to replace advice given to you by your health care provider. Make sure you discuss any questions you have with your health care provider. Document Released: 03/25/2009 Document Revised: 11/04/2015 Document Reviewed: 07/03/2014 Elsevier Interactive Patient Education  2017 [employer].

## 2022-05-22 ENCOUNTER — Ambulatory Visit: Payer: Medicare Other | Admitting: Family Medicine

## 2022-06-15 ENCOUNTER — Other Ambulatory Visit: Payer: Self-pay | Admitting: Family Medicine

## 2022-06-21 ENCOUNTER — Ambulatory Visit (INDEPENDENT_AMBULATORY_CARE_PROVIDER_SITE_OTHER): Payer: 59 | Admitting: Family Medicine

## 2022-06-21 ENCOUNTER — Encounter: Payer: Self-pay | Admitting: Family Medicine

## 2022-06-21 VITALS — BP 147/83 | HR 97 | Temp 97.4°F | Ht 64.0 in | Wt 125.0 lb

## 2022-06-21 DIAGNOSIS — R7309 Other abnormal glucose: Secondary | ICD-10-CM | POA: Diagnosis not present

## 2022-06-21 DIAGNOSIS — F02B0 Dementia in other diseases classified elsewhere, moderate, without behavioral disturbance, psychotic disturbance, mood disturbance, and anxiety: Secondary | ICD-10-CM

## 2022-06-21 DIAGNOSIS — G301 Alzheimer's disease with late onset: Secondary | ICD-10-CM | POA: Diagnosis not present

## 2022-06-21 DIAGNOSIS — R768 Other specified abnormal immunological findings in serum: Secondary | ICD-10-CM | POA: Diagnosis not present

## 2022-06-21 DIAGNOSIS — R21 Rash and other nonspecific skin eruption: Secondary | ICD-10-CM

## 2022-06-21 LAB — BAYER DCA HB A1C WAIVED: HB A1C (BAYER DCA - WAIVED): 5.5 % (ref 4.8–5.6)

## 2022-06-21 MED ORDER — BETAMETHASONE SOD PHOS & ACET 6 (3-3) MG/ML IJ SUSP
3.0000 mg | Freq: Once | INTRAMUSCULAR | Status: AC
  Administered 2022-06-21: 3 mg via INTRAMUSCULAR

## 2022-06-21 MED ORDER — BLOOD GLUCOSE MONITOR KIT: PACK | 0 refills | Status: DC

## 2022-06-21 NOTE — Progress Notes (Signed)
Subjective:  Patient ID: Debra Villarreal, female    DOB: 1931/09/25  Age: 89 y.o. MRN: [id number]  CC: Rash   HPI Debra Villarreal presents for benazepril made her feel dizzy. Gave her a rash. Dced it today. Some itching rash at the left thigh and left forearm. Shows me copy of blood donor card showing Debra Villarreal has antibody  IgE. Pt. Also concerned for DM. Requests A1c.     06/21/2022   11:19 AM 05/08/2022   10:47 AM 02/08/2022    8:25 AM  Depression screen PHQ 2/9  Decreased Interest 0 0 0  Down, Depressed, Hopeless 0 0 0  PHQ - 2 Score 0 0 0    History Debra Villarreal has a past medical history of Anxiety, Arthritis, Bilateral cataracts, Hypertension, Takotsubo cardiomyopathy (2007), Type 2 diabetes mellitus ([place]), and Uterine cancer ([place]).   Debra Villarreal has a past surgical history that includes Abdominal hysterectomy; Appendectomy; Cataract extraction w/PHACO (10/12/2011); Cataract extraction w/PHACO (10/23/2011); Colonoscopy (01/31/2012); left heart catheterization with coronary angiogram (N/A, 02/09/2012); and Cholecystectomy (N/A, 11/19/2020).   Her family history includes Cancer in her mother and sister; Heart attack in her father.Debra Villarreal reports that Debra Villarreal has never smoked. Debra Villarreal has never used smokeless tobacco. Debra Villarreal reports that Debra Villarreal does not drink alcohol and does not use drugs.    ROS Review of Systems  Constitutional:  Negative for activity change.  Psychiatric/Behavioral:  Positive for confusion and decreased concentration.     Objective:  BP (!) 147/83   Pulse 97   Temp (!) 97.4 F (36.3 C)   Ht '5\' 4"'$  (1.626 m)   Wt 125 lb (56.7 kg)   SpO2 97%   BMI 21.46 kg/m   BP Readings from Last 3 Encounters:  06/21/22 (!) 147/83  02/08/22 (!) 128/55  12/29/21 (!) 148/87    Wt Readings from Last 3 Encounters:  06/21/22 125 lb (56.7 kg)  05/08/22 125 lb (56.7 kg)  02/08/22 123 lb 12.8 oz (56.2 kg)     Physical Exam Constitutional:      General: Debra Villarreal is not in acute distress.     Appearance: Debra Villarreal is well-developed.  Cardiovascular:     Rate and Rhythm: Normal rate and regular rhythm.  Pulmonary:     Breath sounds: Normal breath sounds.  Musculoskeletal:        General: Normal range of motion.  Skin:    General: Skin is warm and dry.     Findings: Rash (minimal erythema at the left lower, medial thigh.) present.  Neurological:     Mental Status: Debra Villarreal is alert and oriented to person, place, and time.       Assessment & Plan:   Illyria was seen today for rash.  Diagnoses and all orders for this visit:  Red blood cell antibody positive  Moderate late onset Alzheimer's dementia, unspecified whether behavioral, psychotic, or mood disturbance or anxiety (HCC)  Elevated glucose -     Bayer DCA Hb A1c Waived -     CMP14+EGFR  Rash -     betamethasone acetate-betamethasone sodium phosphate (CELESTONE) injection 3 mg  Other orders -     Discontinue: blood glucose meter kit and supplies KIT; Dispense based on patient and insurance preference. Use up to four times daily as directed. (FOR ICD-10 : E11.9       I have discontinued [employer] blood glucose meter kit and supplies. I am also having her maintain her aspirin EC, Calcium Citrate-Vitamin D (CALCIUM + D PO),  acetaminophen, busPIRone, alendronate, meloxicam, Flaxseed (Linseed), glucose blood, onetouch ultrasoft, simvastatin, and benazepril. We administered betamethasone acetate-betamethasone sodium phosphate.  Allergies as of 06/21/2022   No Known Allergies      Medication List        Accurate as of June 21, 2022  8:24 PM. If you have any questions, ask your nurse or doctor.          acetaminophen 325 MG tablet Commonly known as: TYLENOL Take 325 mg by mouth every 6 (six) hours as needed for mild pain or moderate pain.   alendronate 70 MG tablet Commonly known as: FOSAMAX Take 1 tablet (70 mg total) by mouth once a week. For bone health   aspirin EC 81 MG tablet Take 81 mg  by mouth every 6 (six) hours as needed for moderate pain.   benazepril 5 MG tablet Commonly known as: LOTENSIN TAKE 1 TABLET BY MOUTH DAILY   busPIRone 5 MG tablet Commonly known as: BUSPAR Take 1 tablet (5 mg total) by mouth 2 (two) times daily.   CALCIUM + D PO Take 1 tablet by mouth 2 (two) times daily.   Flaxseed (Linseed) 1000 MG Caps Take by mouth.   glucose blood test strip Use as instructed   meloxicam 15 MG tablet Commonly known as: MOBIC Take 15 mg by mouth daily.   onetouch ultrasoft lancets Use as instructed   simvastatin 5 MG tablet Commonly known as: ZOCOR Take 1 tablet (5 mg total) by mouth daily. Take 5 mg by mouth daily.         Follow-up: Return if symptoms worsen or fail to improve.  Claretta Fraise, M.D.

## 2022-06-22 ENCOUNTER — Telehealth: Payer: Self-pay | Admitting: Family Medicine

## 2022-06-22 LAB — CMP14+EGFR
ALT: 7 IU/L (ref 0–32)
AST: 18 IU/L (ref 0–40)
Albumin/Globulin Ratio: 1.7 (ref 1.2–2.2)
Albumin: 4.6 g/dL (ref 3.7–4.7)
Alkaline Phosphatase: 56 IU/L (ref 44–121)
BUN/Creatinine Ratio: 18 (ref 12–28)
BUN: 21 mg/dL (ref 8–27)
Bilirubin Total: 0.2 mg/dL (ref 0.0–1.2)
CO2: 23 mmol/L (ref 20–29)
Calcium: 10 mg/dL (ref 8.7–10.3)
Chloride: 102 mmol/L (ref 96–106)
Creatinine, Ser: 1.16 mg/dL — ABNORMAL HIGH (ref 0.57–1.00)
Globulin, Total: 2.7 g/dL (ref 1.5–4.5)
Glucose: 106 mg/dL — ABNORMAL HIGH (ref 70–99)
Potassium: 4.9 mmol/L (ref 3.5–5.2)
Sodium: 140 mmol/L (ref 134–144)
Total Protein: 7.3 g/dL (ref 6.0–8.5)
eGFR: 45 mL/min/{1.73_m2} — ABNORMAL LOW (ref >59)

## 2022-06-22 NOTE — Telephone Encounter (Signed)
Pt daughter r/c

## 2022-06-22 NOTE — Telephone Encounter (Signed)
Pts daughter Mariann Laster) made aware. Will explain to pt.

## 2022-06-22 NOTE — Progress Notes (Signed)
Hello Kynzie,  Your lab result is normal and/or stable.Some minor variations that are not significant are commonly marked abnormal, but do not represent any medical problem for you.  Best regards, Kamali Nephew, M.D.

## 2022-06-22 NOTE — Telephone Encounter (Signed)
Patients blood work indicated that she does not have diabetes. Insurance will not cover this.  Returned call, no answer

## 2022-06-22 NOTE — Telephone Encounter (Signed)
RETURNED [place], NO ANSWER

## 2022-06-27 ENCOUNTER — Ambulatory Visit: Payer: 59 | Admitting: Family Medicine

## 2022-07-03 ENCOUNTER — Encounter: Payer: Self-pay | Admitting: Family Medicine

## 2022-08-24 ENCOUNTER — Other Ambulatory Visit: Payer: Self-pay | Admitting: Family Medicine

## 2022-09-11 ENCOUNTER — Ambulatory Visit: Payer: 59 | Admitting: Family Medicine

## 2022-09-11 ENCOUNTER — Encounter: Payer: Self-pay | Admitting: Family

## 2022-09-11 ENCOUNTER — Ambulatory Visit (INDEPENDENT_AMBULATORY_CARE_PROVIDER_SITE_OTHER): Payer: 59 | Admitting: Family

## 2022-09-11 ENCOUNTER — Telehealth: Payer: Self-pay | Admitting: Family Medicine

## 2022-09-11 VITALS — BP 132/82 | HR 81 | Temp 97.7°F | Ht 64.0 in | Wt 120.0 lb

## 2022-09-11 DIAGNOSIS — R4182 Altered mental status, unspecified: Secondary | ICD-10-CM | POA: Diagnosis not present

## 2022-09-11 DIAGNOSIS — R8290 Unspecified abnormal findings in urine: Secondary | ICD-10-CM | POA: Diagnosis not present

## 2022-09-11 LAB — MICROSCOPIC EXAMINATION
RBC, Urine: NONE SEEN /hpf (ref 0–2)
Renal Epithel, UA: NONE SEEN /hpf

## 2022-09-11 LAB — URINALYSIS, COMPLETE
Bilirubin, UA: NEGATIVE
Glucose, UA: NEGATIVE
Ketones, UA: NEGATIVE
Nitrite, UA: POSITIVE — AB
Protein,UA: NEGATIVE
RBC, UA: NEGATIVE
Specific Gravity, UA: 1.01 (ref 1.005–1.030)
Urobilinogen, Ur: 0.2 mg/dL (ref 0.2–1.0)
pH, UA: 5.5 (ref 5.0–7.5)

## 2022-09-11 NOTE — Patient Instructions (Signed)
Confusion Confusion is the inability to think with your usual speed or clarity. Confusion can be caused by many things. People who are confused often describe their thinking as cloudy or unclear. Confusion can also include feeling disoriented. This means you are unaware of where you are or who you are. You may also not know the date or time. When confused, you may have trouble remembering, paying attention, or making decisions. Some people also act aggressively when they are confused. In some cases, confusion may come on quickly. In other cases, it may develop slowly over time. Confusion may be caused by medical conditions such as: Infections, such as a urinary tract infection (UTI). Low levels of oxygen, which can develop from conditions such as long-term lung disorders. Decrease in brain function due to dementia and other conditions that affect the brain, such as seizures, strokes, brain tumors, or head injuries. Mental health conditions, like panic attacks, anxiety, depression, and hallucinations. Confusion may also be caused by physical factors such as: Loss of fluid (dehydration) or an imbalance of salts and minerals in the body (electrolytes). Lack of certain nutrients like niacin, thiamine, or other B vitamins. Fever or hypothermia, which is a sudden drop in body temperature. Low or high blood sugar. Low or high blood pressure. Other causes include: Lack of sleep or changes in routine or surroundings, such as when traveling or staying in a hospital. Using too much alcohol, drugs, or medicine. Side effects of medicines, or taking medicines that affect other medicines (drug interactions). Follow these instructions at home: Pay attention to your symptoms. Tell your health care provider about any changes or if you develop new symptoms. Follow these instructions to control or treat symptoms. Ask a family member or friend for help if needed. Medicines  Take over-the-counter and prescription  medicines only as told by your health care provider. Ask your health care provider about changing or stopping any medicines that may be causing your confusion. Avoid pain medicines or sleep medicines until you have fully recovered. Use a pillbox or an alarm to help you take the right medicines at the right time. Lifestyle  Eat a balanced diet that includes fruits and vegetables. Get enough sleep. For most adults, this is 7-9 hours each night. Do not drink alcohol. Do not become isolated. Spend time with other people and make plans for your days. Do not drive until your health care provider says that it is safe to do so. Do not use any products that contain nicotine or tobacco, such as cigarettes, e-cigarettes, and chewing tobacco. If you need help quitting, ask your health care provider. Stop other activities that may increase your chances of getting hurt. These may include some work duties, sports activities, swimming, or bike riding. Ask your health care provider what activities are safe for you. Tips for caregivers Find out if the person is confused. Ask the person to state his or her name, age, and the date. If the person is unsure or answers incorrectly, he or she may be confused and need assistance. Always introduce yourself, no matter how well the person knows you. Remind the person of his or her location. Place a calendar and clock near the person who is confused. Keep a regular schedule. Make sure the person has plenty of light during the day and sleep at night. Talk about current events and plans for the day. Keep the environment calm, quiet, and peaceful. Help the person do the things that he or she is unable to   do. These include: Taking medicines. Keeping medical appointments. Helping with household duties, including meal preparation. Running errands. Get help if you need it. There are several support groups for caregivers. If the person you are helping needs more support,  consider day care, extended-care programs, or a skilled nursing facility. The person's health care provider may be able to help evaluate these options. General instructions Monitor yourself for any conditions you may have. These can include: Checking your blood glucose levels if you have diabetes. Maintaining a healthy weight. Monitoring your blood pressure if you have hypertension. Monitoring your body temperature if you have a fever. Keep all follow-up visits. This is important. Contact a health care provider if: You have new symptoms or your symptoms get worse. Get help right away if you: Feel that you are not able to care for yourself. Develop severe headaches, repeated vomiting, seizures, blackouts, or slurred speech. Have increasing confusion, weakness, numbness, restlessness, or personality changes. Develop a loss of balance, have marked dizziness, feel uncoordinated, or fall. Develop severe anxiety, or you have delusions or hallucinations. These symptoms may represent a serious problem that is an emergency. Do not wait to see if the symptoms will go away. Get medical help right away. Call your local emergency services (911 in the [place] Do not drive yourself to the hospital. Summary Confusion is the inability to think with your usual speed or clarity. People who are confused often describe their thinking as cloudy or unclear. Confusion can also include having trouble remembering, paying attention, or making decisions. Confusion may come on quickly or develop slowly over time, depending on the cause. There are many different causes of confusion. Ask for help from family members or friends if you are unable to take care of yourself. This information is not intended to replace advice given to you by your health care provider. Make sure you discuss any questions you have with your health care provider. Document Revised: 09/23/2019 Document Reviewed: 09/23/2019 Elsevier Patient Education   2023 Elsevier Inc.  

## 2022-09-11 NOTE — Telephone Encounter (Signed)
Unfortunately, I am not taking new patients at this time. 

## 2022-09-11 NOTE — Progress Notes (Signed)
   Subjective:    Patient ID: Debra Villarreal, female    DOB: 1931-09-06, 89 y.o.   MRN: [id number]  Chief Complaint  Patient presents with   Urinary Tract Infection    HPI Pt is brought in by her daughter today. She is having mental changes in the last few months. Her family wants her to be checked for a UTI. Denies any fever, urinary frequency. Does report odor.   Daughter states she lives at home by herself and is taking her medications by herself. She has been doing well, until the last 2-3 months. She has had a great deal of stress with one daughter having cancer and the other daughter having knee surgery.    Review of Systems  All other systems reviewed and are negative.      Objective:   Physical Exam Vitals reviewed.  Constitutional:      General: She is not in acute distress.    Appearance: She is well-developed.  HENT:     Head: Normocephalic and atraumatic.     Right Ear: Tympanic membrane normal.     Left Ear: Tympanic membrane normal.  Eyes:     Pupils: Pupils are equal, round, and reactive to light.  Neck:     Thyroid: No thyromegaly.  Cardiovascular:     Rate and Rhythm: Normal rate and regular rhythm.     Heart sounds: Normal heart sounds. No murmur heard. Pulmonary:     Effort: Pulmonary effort is normal. No respiratory distress.     Breath sounds: Normal breath sounds. No wheezing.  Abdominal:     General: Bowel sounds are normal. There is no distension.     Palpations: Abdomen is soft.     Tenderness: There is no abdominal tenderness.  Musculoskeletal:        General: No tenderness. Normal range of motion.     Cervical back: Normal range of motion and neck supple.  Skin:    General: Skin is warm and dry.  Neurological:     Mental Status: She is alert and oriented to person, place, and time.     Cranial Nerves: No cranial nerve deficit.     Deep Tendon Reflexes: Reflexes are normal and symmetric.  Psychiatric:        Behavior: Behavior normal.         Thought Content: Thought content normal.        Judgment: Judgment normal.       BP 132/82   Pulse 81   Temp 97.7 F (36.5 C) (Temporal)   Ht 5\' 4"  (1.626 m)   Wt 120 lb (54.4 kg)   SpO2 99%   BMI 20.60 kg/m      Assessment & Plan:  Debra Villarreal comes in today with chief complaint of Urinary Tract Infection   Diagnosis and orders addressed:  1. Abnormal urine odor - Urinalysis, Complete - Urine Culture  2. Altered mental status, unspecified altered mental status type - CMP14+EGFR - CBC with Differential/Platelet - TSH   Labs pending PT is confused, if all labs normal could think about CT head. Needs to follow up with PCP to discuss. May need Neurologists referral.   Evelina Dun, FNP

## 2022-09-12 ENCOUNTER — Other Ambulatory Visit: Payer: Self-pay | Admitting: Family

## 2022-09-12 ENCOUNTER — Other Ambulatory Visit: Payer: Self-pay | Admitting: Family Medicine

## 2022-09-12 ENCOUNTER — Telehealth: Payer: Self-pay | Admitting: Family Medicine

## 2022-09-12 LAB — CBC WITH DIFFERENTIAL/PLATELET
Basophils Absolute: 0 10*3/uL (ref 0.0–0.2)
Basos: 0 %
EOS (ABSOLUTE): 0.2 10*3/uL (ref 0.0–0.4)
Eos: 4 %
Hematocrit: 37 % (ref 34.0–46.6)
Hemoglobin: 11.9 g/dL (ref 11.1–15.9)
Immature Grans (Abs): 0 10*3/uL (ref 0.0–0.1)
Immature Granulocytes: 0 %
Lymphocytes Absolute: 1.9 10*3/uL (ref 0.7–3.1)
Lymphs: 32 %
MCH: 28.8 pg (ref 26.6–33.0)
MCHC: 32.2 g/dL (ref 31.5–35.7)
MCV: 90 fL (ref 79–97)
Monocytes Absolute: 0.5 10*3/uL (ref 0.1–0.9)
Monocytes: 9 %
Neutrophils Absolute: 3.2 10*3/uL (ref 1.4–7.0)
Neutrophils: 55 %
Platelets: 253 10*3/uL (ref 150–450)
RBC: 4.13 x10E6/uL (ref 3.77–5.28)
RDW: 12.8 % (ref 11.7–15.4)
WBC: 5.9 10*3/uL (ref 3.4–10.8)

## 2022-09-12 LAB — CMP14+EGFR
ALT: 14 IU/L (ref 0–32)
AST: 26 IU/L (ref 0–40)
Albumin/Globulin Ratio: 1.6 (ref 1.2–2.2)
Albumin: 4.4 g/dL (ref 3.7–4.7)
Alkaline Phosphatase: 58 IU/L (ref 44–121)
BUN/Creatinine Ratio: 17 (ref 12–28)
BUN: 20 mg/dL (ref 8–27)
Bilirubin Total: 0.2 mg/dL (ref 0.0–1.2)
CO2: 21 mmol/L (ref 20–29)
Calcium: 9.6 mg/dL (ref 8.7–10.3)
Chloride: 102 mmol/L (ref 96–106)
Creatinine, Ser: 1.21 mg/dL — ABNORMAL HIGH (ref 0.57–1.00)
Globulin, Total: 2.8 g/dL (ref 1.5–4.5)
Glucose: 103 mg/dL — ABNORMAL HIGH (ref 70–99)
Potassium: 5 mmol/L (ref 3.5–5.2)
Sodium: 139 mmol/L (ref 134–144)
Total Protein: 7.2 g/dL (ref 6.0–8.5)
eGFR: 43 mL/min/{1.73_m2} — ABNORMAL LOW (ref >59)

## 2022-09-12 LAB — TSH: TSH: 0.484 u[IU]/mL (ref 0.450–4.500)

## 2022-09-12 MED ORDER — CEPHALEXIN 500 MG PO CAPS: 500.0000 mg | ORAL_CAPSULE | Freq: Two times a day (BID) | ORAL | 0 refills | Status: DC

## 2022-09-12 NOTE — Telephone Encounter (Signed)
Made patient aware. Wants to know if there are any other female providers that she can switch to?

## 2022-09-12 NOTE — Telephone Encounter (Signed)
Debra Villarreal number is not in chart called other daughter on dpr

## 2022-09-13 LAB — URINE CULTURE

## 2022-09-18 ENCOUNTER — Other Ambulatory Visit: Payer: Self-pay | Admitting: Family

## 2022-09-18 DIAGNOSIS — E7849 Other hyperlipidemia: Secondary | ICD-10-CM | POA: Diagnosis not present

## 2022-09-18 DIAGNOSIS — I10 Essential (primary) hypertension: Secondary | ICD-10-CM | POA: Diagnosis not present

## 2022-09-18 DIAGNOSIS — R296 Repeated falls: Secondary | ICD-10-CM | POA: Diagnosis not present

## 2022-09-18 DIAGNOSIS — E1121 Type 2 diabetes mellitus with diabetic nephropathy: Secondary | ICD-10-CM | POA: Diagnosis not present

## 2022-09-18 DIAGNOSIS — Z6822 Body mass index (BMI) 22.0-22.9, adult: Secondary | ICD-10-CM | POA: Diagnosis not present

## 2022-09-18 DIAGNOSIS — Z0000 Encounter for general adult medical examination without abnormal findings: Secondary | ICD-10-CM | POA: Diagnosis not present

## 2022-09-18 MED ORDER — AMOXICILLIN-POT CLAVULANATE 875-125 MG PO TABS: 1.0000 | ORAL_TABLET | Freq: Two times a day (BID) | ORAL | 0 refills | Status: DC

## 2022-10-04 DIAGNOSIS — H353134 Nonexudative age-related macular degeneration, bilateral, advanced atrophic with subfoveal involvement: Secondary | ICD-10-CM | POA: Diagnosis not present

## 2022-10-04 DIAGNOSIS — H16223 Keratoconjunctivitis sicca, not specified as Sjogren's, bilateral: Secondary | ICD-10-CM | POA: Diagnosis not present

## 2022-10-04 DIAGNOSIS — H02831 Dermatochalasis of right upper eyelid: Secondary | ICD-10-CM | POA: Diagnosis not present

## 2022-10-04 DIAGNOSIS — H02834 Dermatochalasis of left upper eyelid: Secondary | ICD-10-CM | POA: Diagnosis not present

## 2022-12-18 DIAGNOSIS — E1121 Type 2 diabetes mellitus with diabetic nephropathy: Secondary | ICD-10-CM | POA: Diagnosis not present

## 2022-12-18 DIAGNOSIS — Z0000 Encounter for general adult medical examination without abnormal findings: Secondary | ICD-10-CM | POA: Diagnosis not present

## 2022-12-18 DIAGNOSIS — Z6823 Body mass index (BMI) 23.0-23.9, adult: Secondary | ICD-10-CM | POA: Diagnosis not present

## 2022-12-18 DIAGNOSIS — N182 Chronic kidney disease, stage 2 (mild): Secondary | ICD-10-CM | POA: Diagnosis not present

## 2022-12-18 DIAGNOSIS — I10 Essential (primary) hypertension: Secondary | ICD-10-CM | POA: Diagnosis not present

## 2022-12-18 DIAGNOSIS — E7849 Other hyperlipidemia: Secondary | ICD-10-CM | POA: Diagnosis not present

## 2022-12-25 ENCOUNTER — Encounter (HOSPITAL_COMMUNITY): Payer: Self-pay | Admitting: Emergency Medicine

## 2022-12-25 ENCOUNTER — Emergency Department (HOSPITAL_COMMUNITY): Payer: 59

## 2022-12-25 ENCOUNTER — Emergency Department (HOSPITAL_COMMUNITY)
Admission: EM | Admit: 2022-12-25 | Discharge: 2022-12-25 | Disposition: A | Discharge status: Home / Self Care | Payer: 59 | Attending: Emergency Medicine | Admitting: Emergency Medicine

## 2022-12-25 ENCOUNTER — Other Ambulatory Visit: Payer: Self-pay

## 2022-12-25 DIAGNOSIS — Z8542 Personal history of malignant neoplasm of other parts of uterus: Secondary | ICD-10-CM | POA: Diagnosis not present

## 2022-12-25 DIAGNOSIS — Z79899 Other long term (current) drug therapy: Secondary | ICD-10-CM | POA: Diagnosis not present

## 2022-12-25 DIAGNOSIS — E119 Type 2 diabetes mellitus without complications: Secondary | ICD-10-CM | POA: Diagnosis not present

## 2022-12-25 DIAGNOSIS — I159 Secondary hypertension, unspecified: Secondary | ICD-10-CM

## 2022-12-25 DIAGNOSIS — R519 Headache, unspecified: Secondary | ICD-10-CM

## 2022-12-25 DIAGNOSIS — Z7982 Long term (current) use of aspirin: Secondary | ICD-10-CM | POA: Diagnosis not present

## 2022-12-25 DIAGNOSIS — I10 Essential (primary) hypertension: Secondary | ICD-10-CM | POA: Diagnosis not present

## 2022-12-25 DIAGNOSIS — R9082 White matter disease, unspecified: Secondary | ICD-10-CM | POA: Diagnosis not present

## 2022-12-25 DIAGNOSIS — R42 Dizziness and giddiness: Secondary | ICD-10-CM | POA: Diagnosis not present

## 2022-12-25 LAB — BASIC METABOLIC PANEL
Anion gap: 8 (ref 5–15)
BUN: 32 mg/dL — ABNORMAL HIGH (ref 8–23)
CO2: 25 mmol/L (ref 22–32)
Calcium: 9.4 mg/dL (ref 8.9–10.3)
Chloride: 105 mmol/L (ref 98–111)
Creatinine, Ser: 1.25 mg/dL — ABNORMAL HIGH (ref 0.44–1.00)
GFR, Estimated: 41 mL/min — ABNORMAL LOW (ref >60)
Glucose, Bld: 112 mg/dL — ABNORMAL HIGH (ref 70–99)
Potassium: 5.2 mmol/L — ABNORMAL HIGH (ref 3.5–5.1)
Sodium: 138 mmol/L (ref 135–145)

## 2022-12-25 LAB — CBC WITH DIFFERENTIAL/PLATELET
Abs Immature Granulocytes: 0.03 10*3/uL (ref 0.00–0.07)
Basophils Absolute: 0 10*3/uL (ref 0.0–0.1)
Basophils Relative: 0 %
Eosinophils Absolute: 0.1 10*3/uL (ref 0.0–0.5)
Eosinophils Relative: 1 %
HCT: 35.1 % — ABNORMAL LOW (ref 36.0–46.0)
Hemoglobin: 11.2 g/dL — ABNORMAL LOW (ref 12.0–15.0)
Immature Granulocytes: 1 %
Lymphocytes Relative: 23 %
Lymphs Abs: 1.4 10*3/uL (ref 0.7–4.0)
MCH: 29.4 pg (ref 26.0–34.0)
MCHC: 31.9 g/dL (ref 30.0–36.0)
MCV: 92.1 fL (ref 80.0–100.0)
Monocytes Absolute: 0.6 10*3/uL (ref 0.1–1.0)
Monocytes Relative: 10 %
Neutro Abs: 4 10*3/uL (ref 1.7–7.7)
Neutrophils Relative %: 65 %
Platelets: 236 10*3/uL (ref 150–400)
RBC: 3.81 MIL/uL — ABNORMAL LOW (ref 3.87–5.11)
RDW: 13 % (ref 11.5–15.5)
WBC: 6.1 10*3/uL (ref 4.0–10.5)
nRBC: 0 % (ref 0.0–0.2)

## 2022-12-25 MED ORDER — IBUPROFEN 800 MG PO TABS
800.0000 mg | ORAL_TABLET | Freq: Once | ORAL | Status: AC
  Administered 2022-12-25: 800 mg via ORAL
  Filled 2022-12-25: qty 1

## 2022-12-25 MED ORDER — BENAZEPRIL HCL 5 MG PO TABS
5.0000 mg | ORAL_TABLET | Freq: Every day | ORAL | Status: DC
  Administered 2022-12-25: 5 mg via ORAL
  Filled 2022-12-25: qty 1

## 2022-12-25 NOTE — ED Provider Notes (Signed)
[place] EMERGENCY DEPARTMENT AT [facility] Provider Note   CSN: [id number] Arrival date & time: 12/25/22  0930     History  Chief Complaint  Patient presents with   Headache    Debra Villarreal is a 89 y.o. female.  Patient is a 89 yo female with pmh of hypertension, anxiety, DM2, and uterine cancer in 1966 presenting for headaches. Patient admits headache on the left side that radiates down the left neck x 3 months. She denies any vision changes, nausea, vomiting, sensation/motor deficits. She denies any falls or recent trauma.   The history is provided by the patient. No language interpreter was used.  Headache Associated symptoms: no abdominal pain, no back pain, no cough, no ear pain, no eye pain, no fever, no seizures, no sore throat and no vomiting        Home Medications Prior to Admission medications   Medication Sig Start Date End Date Taking? Authorizing Provider  cephALEXin (KEFLEX) 500 MG capsule Take 1 capsule (500 mg total) by mouth 2 (two) times daily. 09/12/22   Junie Spencer, FNP  acetaminophen (TYLENOL) 325 MG tablet Take 325 mg by mouth every 6 (six) hours as needed for mild pain or moderate pain.     [provider]  alendronate (FOSAMAX) 70 MG tablet TAKE 1 TABLET BY MOUTH ONCE A WEEK FOR  BONE  HEALTH 09/12/22   Mechele Claude, MD  amoxicillin-clavulanate (AUGMENTIN) 875-125 MG tablet Take 1 tablet by mouth 2 (two) times daily. 09/18/22   Junie Spencer, FNP  aspirin EC 81 MG tablet Take 81 mg by mouth every 6 (six) hours as needed for moderate pain.    [provider]  benazepril (LOTENSIN) 5 MG tablet TAKE 1 TABLET BY MOUTH DAILY 08/24/22   Mechele Claude, MD  busPIRone (BUSPAR) 5 MG tablet Take 1 tablet (5 mg total) by mouth 2 (two) times daily. 01/04/16   Abelino Derrick, PA-C  Calcium Citrate-Vitamin D (CALCIUM + D PO) Take 1 tablet by mouth 2 (two) times daily.    [provider]  Flaxseed, Linseed, 1000 MG CAPS  Take by mouth.    [provider]  glucose blood test strip Use as instructed 02/08/22   Mechele Claude, MD  Lancets [facility] ULTRASOFT) lancets Use as instructed 02/08/22   Mechele Claude, MD  meloxicam (MOBIC) 15 MG tablet Take 15 mg by mouth daily. 11/08/21   [provider]  simvastatin (ZOCOR) 5 MG tablet Take 1 tablet (5 mg total) by mouth daily. Take 5 mg by mouth daily. 04/07/22   Mechele Claude, MD      Allergies    Patient has no known allergies.    Review of Systems   Review of Systems  Constitutional:  Negative for chills and fever.  HENT:  Negative for ear pain and sore throat.   Eyes:  Negative for pain and visual disturbance.  Respiratory:  Negative for cough and shortness of breath.   Cardiovascular:  Negative for chest pain and palpitations.  Gastrointestinal:  Negative for abdominal pain and vomiting.  Genitourinary:  Negative for dysuria and hematuria.  Musculoskeletal:  Negative for arthralgias and back pain.  Skin:  Negative for color change and rash.  Neurological:  Positive for headaches. Negative for seizures and syncope.  All other systems reviewed and are negative.   Physical Exam Updated Vital Signs BP (!) 174/90 (BP Location: Left Arm)   Pulse 85   Temp 97.9 F (36.6  C) (Oral)   Resp 16   SpO2 98%  Physical Exam Vitals and nursing note reviewed.  Constitutional:      General: She is not in acute distress.    Appearance: She is well-developed.  HENT:     Head: Normocephalic and atraumatic.  Eyes:     General: Lids are normal. Vision grossly intact.     Extraocular Movements: Extraocular movements intact.     Conjunctiva/sclera: Conjunctivae normal.     Pupils: Pupils are equal, round, and reactive to light.  Cardiovascular:     Rate and Rhythm: Normal rate and regular rhythm.     Heart sounds: No murmur heard. Pulmonary:     Effort: Pulmonary effort is normal. No respiratory distress.     Breath sounds: Normal breath sounds.   Abdominal:     Palpations: Abdomen is soft.     Tenderness: There is no abdominal tenderness.  Musculoskeletal:        General: No swelling.     Cervical back: Neck supple.  Skin:    General: Skin is warm and dry.     Capillary Refill: Capillary refill takes less than 2 seconds.  Neurological:     Mental Status: She is alert and oriented to person, place, and time.     GCS: GCS eye subscore is 4. GCS verbal subscore is 5. GCS motor subscore is 6.     Cranial Nerves: Cranial nerves 2-12 are intact.     Sensory: Sensation is intact.     Motor: Motor function is intact.     Coordination: Coordination is intact.     Gait: Gait is intact.  Psychiatric:        Mood and Affect: Mood normal.     ED Results / Procedures / Treatments   Labs (all labs ordered are listed, but only abnormal results are displayed) Labs Reviewed  CBC WITH DIFFERENTIAL/PLATELET - Abnormal; Notable for the following components:      Result Value   RBC 3.81 (*)    Hemoglobin 11.2 (*)    HCT 35.1 (*)    All other components within normal limits  BASIC METABOLIC PANEL - Abnormal; Notable for the following components:   Potassium 5.2 (*)    Glucose, Bld 112 (*)    BUN 32 (*)    Creatinine, Ser 1.25 (*)    GFR, Estimated 41 (*)    All other components within normal limits    EKG None  Radiology CT Head Wo Contrast  Result Date: 12/25/2022 CLINICAL DATA:  Headache and dizziness for 3 months. EXAM: CT HEAD WITHOUT CONTRAST TECHNIQUE: Contiguous axial images were obtained from the base of the skull through the vertex without intravenous contrast. RADIATION DOSE REDUCTION: This exam was performed according to the departmental dose-optimization program which includes automated exposure control, adjustment of the mA and/or kV according to patient size and/or use of iterative reconstruction technique. COMPARISON:  None Available. FINDINGS: Brain: There is no evidence for acute hemorrhage, hydrocephalus, mass  lesion, or abnormal extra-axial fluid collection. No definite CT evidence for acute infarction. Diffuse loss of parenchymal volume is consistent with atrophy. Patchy low attenuation in the deep hemispheric and periventricular white matter is nonspecific, but likely reflects chronic microvascular ischemic demyelination. Vascular: No hyperdense vessel or unexpected calcification. Skull: No evidence for fracture. No worrisome lytic or sclerotic lesion. Sinuses/Orbits: The visualized paranasal sinuses and mastoid air cells are clear. Visualized portions of the globes and intraorbital fat are unremarkable. Other: None. IMPRESSION:  1. No acute intracranial abnormality. 2. Atrophy with chronic small vessel white matter ischemic disease. Electronically Signed   By: Kennith Center M.D.   On: 12/25/2022 11:05    Procedures Procedures    Medications Ordered in ED Medications  benazepril (LOTENSIN) tablet 5 mg (5 mg Oral Given 12/25/22 1059)    ED Course/ Medical Decision Making/ A&P                             Medical Decision Making Amount and/or Complexity of Data Reviewed Labs: ordered. Radiology: ordered.  Risk Prescription drug management.   12:06 PM 89 yo female with pmh of hypertension, anxiety, DM2, and uterine cancer in 1966 presenting for headaches.  Patient is alert and oriented x 3, pleasant and well-appearing, no acute distress, afebrile, stable vital signs.  Blood pressure 174/90.  Patient states she did not take her antihypertensives prior to arrival.  Medications given in the emergency department.  CT head ordered to rule out intracranial masses secondary to patient's cancer history.  CT head demonstrates no acute process.  Patient recommended for close follow-up with PCP and MRI imaging if headache does not improve in the next week.  Patient's headache also may be secondary to high blood pressure.  Patient states normally her blood pressure is well-controlled at the house and she does  not miss any of her medications.  Stable renal function at this time.  Patient safe for discharge.  No neurovascular deficits.  Patient in no distress and overall condition improved here in the ED. Detailed discussions were had with the patient regarding current findings, and need for close f/u with PCP or on call doctor. The patient has been instructed to return immediately if the symptoms worsen in any way for re-evaluation. Patient verbalized understanding and is in agreement with current care plan. All questions answered prior to discharge.         Final Clinical Impression(s) / ED Diagnoses Final diagnoses:  None    Rx / DC Orders ED Discharge Orders     None         Franne Forts, DO 12/25/22 1206

## 2022-12-25 NOTE — ED Triage Notes (Signed)
Pt reports headache and dizziness x 3 months. Pt states the dizziness comes and goes. Pt reports at this time she is having right sided head pain and neck pain.

## 2023-03-19 DIAGNOSIS — N3091 Cystitis, unspecified with hematuria: Secondary | ICD-10-CM | POA: Diagnosis not present

## 2023-03-19 DIAGNOSIS — E7849 Other hyperlipidemia: Secondary | ICD-10-CM | POA: Diagnosis not present

## 2023-03-19 DIAGNOSIS — E1121 Type 2 diabetes mellitus with diabetic nephropathy: Secondary | ICD-10-CM | POA: Diagnosis not present

## 2023-03-19 DIAGNOSIS — I10 Essential (primary) hypertension: Secondary | ICD-10-CM | POA: Diagnosis not present

## 2023-03-19 DIAGNOSIS — Z6824 Body mass index (BMI) 24.0-24.9, adult: Secondary | ICD-10-CM | POA: Diagnosis not present

## 2023-03-19 DIAGNOSIS — Z0000 Encounter for general adult medical examination without abnormal findings: Secondary | ICD-10-CM | POA: Diagnosis not present

## 2023-03-19 DIAGNOSIS — N182 Chronic kidney disease, stage 2 (mild): Secondary | ICD-10-CM | POA: Diagnosis not present

## 2023-04-04 DIAGNOSIS — H353134 Nonexudative age-related macular degeneration, bilateral, advanced atrophic with subfoveal involvement: Secondary | ICD-10-CM | POA: Diagnosis not present

## 2023-04-04 DIAGNOSIS — H02834 Dermatochalasis of left upper eyelid: Secondary | ICD-10-CM | POA: Diagnosis not present

## 2023-04-04 DIAGNOSIS — H02831 Dermatochalasis of right upper eyelid: Secondary | ICD-10-CM | POA: Diagnosis not present

## 2023-04-04 DIAGNOSIS — H16223 Keratoconjunctivitis sicca, not specified as Sjogren's, bilateral: Secondary | ICD-10-CM | POA: Diagnosis not present

## 2023-08-30 DIAGNOSIS — N3091 Cystitis, unspecified with hematuria: Secondary | ICD-10-CM | POA: Diagnosis not present

## 2023-08-30 DIAGNOSIS — I10 Essential (primary) hypertension: Secondary | ICD-10-CM | POA: Diagnosis not present

## 2023-08-30 DIAGNOSIS — Z6823 Body mass index (BMI) 23.0-23.9, adult: Secondary | ICD-10-CM | POA: Diagnosis not present

## 2023-08-30 DIAGNOSIS — E1121 Type 2 diabetes mellitus with diabetic nephropathy: Secondary | ICD-10-CM | POA: Diagnosis not present

## 2023-08-30 DIAGNOSIS — E7849 Other hyperlipidemia: Secondary | ICD-10-CM | POA: Diagnosis not present

## 2023-08-30 DIAGNOSIS — Z0000 Encounter for general adult medical examination without abnormal findings: Secondary | ICD-10-CM | POA: Diagnosis not present

## 2023-08-30 DIAGNOSIS — N1832 Chronic kidney disease, stage 3b: Secondary | ICD-10-CM | POA: Diagnosis not present

## 2023-08-30 DIAGNOSIS — E1122 Type 2 diabetes mellitus with diabetic chronic kidney disease: Secondary | ICD-10-CM | POA: Diagnosis not present

## 2023-12-18 DIAGNOSIS — Z6823 Body mass index (BMI) 23.0-23.9, adult: Secondary | ICD-10-CM | POA: Diagnosis not present

## 2023-12-18 DIAGNOSIS — I10 Essential (primary) hypertension: Secondary | ICD-10-CM | POA: Diagnosis not present

## 2023-12-18 DIAGNOSIS — E7849 Other hyperlipidemia: Secondary | ICD-10-CM | POA: Diagnosis not present

## 2023-12-18 DIAGNOSIS — E1122 Type 2 diabetes mellitus with diabetic chronic kidney disease: Secondary | ICD-10-CM | POA: Diagnosis not present

## 2023-12-18 DIAGNOSIS — N1832 Chronic kidney disease, stage 3b: Secondary | ICD-10-CM | POA: Diagnosis not present

## 2023-12-18 DIAGNOSIS — Z0000 Encounter for general adult medical examination without abnormal findings: Secondary | ICD-10-CM | POA: Diagnosis not present

## 2023-12-25 DIAGNOSIS — H16223 Keratoconjunctivitis sicca, not specified as Sjogren's, bilateral: Secondary | ICD-10-CM | POA: Diagnosis not present

## 2023-12-25 DIAGNOSIS — Z961 Presence of intraocular lens: Secondary | ICD-10-CM | POA: Diagnosis not present

## 2023-12-25 DIAGNOSIS — H353134 Nonexudative age-related macular degeneration, bilateral, advanced atrophic with subfoveal involvement: Secondary | ICD-10-CM | POA: Diagnosis not present

## 2023-12-25 DIAGNOSIS — H02831 Dermatochalasis of right upper eyelid: Secondary | ICD-10-CM | POA: Diagnosis not present

## 2024-01-16 NOTE — Telephone Encounter (Signed)
 Because Marry is no longer here. I schedule to establish care with Sandra for 30 MINS on 02/13/2024.

## 2024-02-06 ENCOUNTER — Telehealth: Payer: Self-pay | Admitting: Psychiatric/Mental Health

## 2024-02-06 NOTE — Telephone Encounter (Signed)
 I called pt to reschedule her appt with Nena from 09/03 to 09/15 on her dod day. I was waiting for nurse to open the appointment so that I could schedule it and it took a while. In the meantime,her daugter called and scheduled one for November. I tried to call pt and daughter back to see which appt they wanted and nobody answered. We cancelled the one in Nov and I will mail reminder for 09/15.

## 2024-02-13 ENCOUNTER — Ambulatory Visit: Admitting: Nurse Practitioner

## 2024-02-25 ENCOUNTER — Ambulatory Visit (INDEPENDENT_AMBULATORY_CARE_PROVIDER_SITE_OTHER): Admitting: Nurse Practitioner

## 2024-02-25 ENCOUNTER — Encounter: Payer: Self-pay | Admitting: Nurse Practitioner

## 2024-02-25 ENCOUNTER — Telehealth: Payer: Self-pay | Admitting: Nurse Practitioner

## 2024-02-25 VITALS — BP 111/73 | HR 76 | Temp 97.5°F | Ht 64.0 in | Wt 118.0 lb

## 2024-02-25 DIAGNOSIS — Z8639 Personal history of other endocrine, nutritional and metabolic disease: Secondary | ICD-10-CM

## 2024-02-25 DIAGNOSIS — G301 Alzheimer's disease with late onset: Secondary | ICD-10-CM

## 2024-02-25 DIAGNOSIS — E559 Vitamin D deficiency, unspecified: Secondary | ICD-10-CM

## 2024-02-25 DIAGNOSIS — E781 Pure hyperglyceridemia: Secondary | ICD-10-CM

## 2024-02-25 DIAGNOSIS — I422 Other hypertrophic cardiomyopathy: Secondary | ICD-10-CM | POA: Insufficient documentation

## 2024-02-25 DIAGNOSIS — F02B0 Dementia in other diseases classified elsewhere, moderate, without behavioral disturbance, psychotic disturbance, mood disturbance, and anxiety: Secondary | ICD-10-CM

## 2024-02-25 DIAGNOSIS — Z0279 Encounter for issue of other medical certificate: Secondary | ICD-10-CM

## 2024-02-25 DIAGNOSIS — Z0001 Encounter for general adult medical examination with abnormal findings: Secondary | ICD-10-CM | POA: Diagnosis not present

## 2024-02-25 DIAGNOSIS — D649 Anemia, unspecified: Secondary | ICD-10-CM | POA: Diagnosis not present

## 2024-02-25 DIAGNOSIS — R55 Syncope and collapse: Secondary | ICD-10-CM | POA: Insufficient documentation

## 2024-02-25 DIAGNOSIS — E756 Lipid storage disorder, unspecified: Secondary | ICD-10-CM

## 2024-02-25 DIAGNOSIS — Z23 Encounter for immunization: Secondary | ICD-10-CM

## 2024-02-25 DIAGNOSIS — E1169 Type 2 diabetes mellitus with other specified complication: Secondary | ICD-10-CM | POA: Insufficient documentation

## 2024-02-25 DIAGNOSIS — F419 Anxiety disorder, unspecified: Secondary | ICD-10-CM

## 2024-02-25 LAB — BAYER DCA HB A1C WAIVED: HB A1C (BAYER DCA - WAIVED): 5.2 % (ref 4.8–5.6)

## 2024-02-25 NOTE — Telephone Encounter (Signed)
 Pt daughter Arland dropped off handicap forms to be completed and signed.  Form Fee Paid? (Yes)            If NO, form is placed on front office manager desk to hold until payment received. If YES, then form will be placed in the RX/HH Nurse Coordinators box for completion.  Form will not be processed until payment is received

## 2024-02-25 NOTE — Progress Notes (Signed)
 Subjective:  Patient ID: Debra Villarreal, female    DOB: [birth year], 89 y.o.   MRN: [id number]  Patient Care Team: Deitra Morton Hummer, Nena, NP as PCP - General (Nurse Practitioner) Lavona Agent, MD as PCP - Cardiology (Cardiology) Shaaron Lamar HERO, MD (Gastroenterology)   Chief Complaint:  Establish Care   HPI: Debra Villarreal is a 89 y.o. female presenting with her daughter on 02/25/2024 for Establish Care   Discussed the use of AI scribe software for clinical note transcription with the patient, who gave verbal consent to proceed.  History of Present Illness Debra Villarreal is an 89 year old female who presents to establish care.  She experiences difficulty sleeping and expresses a desire to return home and rest. She is currently taking Zoloft for anxiety and depression, which she finds helpful, and metformin 500 mg daily for diabetes. She is unsure about her current use of Zocor  for cholesterol management, as she was previously prescribed it in 2023 but may have discontinued it.  She has a history of osteoporosis and was previously treated with Fosamax , with the last prescription noted in April 2024. She has stopped taking it along with several other medications. She wants to return to her previous medication regimen. She has a history of bladder infections and reports experiencing burning symptoms if she does not drink V8 juice.  She has a history of heart issues, which cause her to pass out when she becomes upset. These episodes last a few minutes, and emergency services are familiar with her condition. She reports that her youngest daughter may experience similar episodes when upset, suggesting a possible family history of these symptoms. Order EKD  Her past medical history includes hypertension, diabetes, vitamin D  deficiency, anemia, anxiety, constipation, hypertriglyceridemia, and incontinence. She is concerned about her sugar intake, noting that she primarily consumes  sugar in coffee and occasionally drinks milkshakes. She has been on metformin since her diabetes diagnosis and is concerned about the necessity of continuing it.  She has received a flu shot and is open to receiving additional vaccinations as needed.    Flowsheet Row Office Visit from 02/25/2024 [facility]  PHQ-9 Total Score 11       02/25/2024   11:18 AM 12/29/2021    9:33 AM 11/17/2021    3:20 PM  GAD 7 : Generalized Anxiety Score  Nervous, Anxious, on Edge 2 0 0  Control/stop worrying 2 0 0  Worry too much - different things 2 0 0  Trouble relaxing 2 0 0  Restless 2 0 0  Easily annoyed or irritable 2 0 0  Afraid - awful might happen 2 0 0  Total GAD 7 Score 14 0 0  Anxiety Difficulty Somewhat difficult Not difficult at all Not difficult at all      Relevant past medical, surgical, family, and social history reviewed and updated as indicated.  Allergies and medications reviewed and updated. Data reviewed: Chart in Epic.   Past Medical History:  Diagnosis Date   Anxiety    Arthritis    Bilateral cataracts    Hypertension    Takotsubo cardiomyopathy 2007   Normary coronaries 2007, initial EF 18% improved to 60%    Type 2 diabetes mellitus (HCC)    Uterine cancer [facility])     Past Surgical History:  Procedure Laterality Date   ABDOMINAL HYSTERECTOMY     1966   APPENDECTOMY     CATARACT EXTRACTION W/PHACO  10/12/2011   Procedure: CATARACT EXTRACTION PHACO AND INTRAOCULAR LENS PLACEMENT (IOC);  Surgeon: Cherene Mania, MD;  Location: [facility];  Service: Ophthalmology;  Laterality: Left;  CDE:18.57   CATARACT EXTRACTION W/PHACO  10/23/2011   Procedure: CATARACT EXTRACTION PHACO AND INTRAOCULAR LENS PLACEMENT (IOC);  Surgeon: Cherene Mania, MD;  Location: [facility];  Service: Ophthalmology;  Laterality: Right;  CDE 18.09   CHOLECYSTECTOMY N/A 11/19/2020   Procedure: LAPAROSCOPIC CHOLECYSTECTOMY;  Surgeon: Mavis Anes, MD;  Location: [facility];  Service:  General;  Laterality: N/A;   COLONOSCOPY  01/31/2012   Procedure: COLONOSCOPY;  Surgeon: Lamar CHRISTELLA Hollingshead, MD;  Location: [facility] SUITE;  Service: Endoscopy;  Laterality: N/A;  1:45   LEFT HEART CATHETERIZATION WITH CORONARY ANGIOGRAM N/A 02/09/2012   Procedure: LEFT HEART CATHETERIZATION WITH CORONARY ANGIOGRAM;  Surgeon: Aleene JINNY Passe, MD;  Location: [facility] CATH LAB;  Service: Cardiovascular;  Laterality: N/A;    Social History   Socioeconomic History   Marital status: Widowed    Spouse name: Not on file   Number of children: Not on file   Years of education: Not on file   Highest education level: Not on file  Occupational History   Not on file  Tobacco Use   Smoking status: Never   Smokeless tobacco: Never  Vaping Use   Vaping status: Never Used  Substance and Sexual Activity   Alcohol use: No   Drug use: No   Sexual activity: Not on file  Other Topics Concern   Not on file  Social History Narrative   Not on file   Social Drivers of Health   Financial Resource Strain: Low Risk  (05/08/2022)   Overall Financial Resource Strain (CARDIA)    Difficulty of Paying Living Expenses: Not hard at all  Food Insecurity: Unknown (02/25/2024)   Hunger Vital Sign    Worried About Running Out of Food in the Last Year: Never true    Ran Out of Food in the Last Year: Not on file  Transportation Needs: No Transportation Needs (02/25/2024)   PRAPARE - Administrator, Civil Service (Medical): No    Lack of Transportation (Non-Medical): No  Physical Activity: Insufficiently Active (05/08/2022)   Exercise Vital Sign    Days of Exercise per Week: 3 days    Minutes of Exercise per Session: 30 min  Stress: No Stress Concern Present (05/08/2022)   [employer] of Occupational Health - Occupational Stress Questionnaire    Feeling of Stress : Not at all  Social Connections: Moderately Isolated (05/08/2022)   Social Connection and Isolation Panel    Frequency of Communication with  Friends and Family: More than three times a week    Frequency of Social Gatherings with Friends and Family: More than three times a week    Attends Religious Services: 1 to 4 times per year    Active Member of [employer] or Organizations: No    Attends Banker Meetings: Never    Marital Status: Widowed  Intimate Partner Violence: Unknown (02/25/2024)   Humiliation, Afraid, Rape, and Kick questionnaire    Fear of Current or Ex-Partner: Not on file    Emotionally Abused: No    Physically Abused: No    Sexually Abused: No    Outpatient Encounter Medications as of 02/25/2024  Medication Sig   benazepril  (LOTENSIN ) 5 MG tablet TAKE 1 TABLET BY MOUTH DAILY   busPIRone  (BUSPAR ) 5 MG tablet Take 1 tablet (5 mg total) by mouth 2 (two)  times daily.   Calcium  Citrate-Vitamin D  (CALCIUM  + D PO) Take 1 tablet by mouth 2 (two) times daily.   metFORMIN (GLUCOPHAGE) 500 MG tablet Take 500 mg by mouth daily.   Omega-3 Fatty Acids (FISH OIL) 300 MG CAPS Take by mouth.   sertraline (ZOLOFT) 25 MG tablet Take 25 mg by mouth daily.   simvastatin  (ZOCOR ) 5 MG tablet Take 1 tablet (5 mg total) by mouth daily. Take 5 mg by mouth daily.   acetaminophen  (TYLENOL ) 325 MG tablet Take 325 mg by mouth every 6 (six) hours as needed for mild pain or moderate pain.  (Patient not taking: Reported on 02/25/2024)   alendronate  (FOSAMAX ) 70 MG tablet TAKE 1 TABLET BY MOUTH ONCE A WEEK FOR  BONE  HEALTH (Patient not taking: Reported on 02/25/2024)   aspirin  EC 81 MG tablet Take 81 mg by mouth every 6 (six) hours as needed for moderate pain. (Patient not taking: Reported on 02/25/2024)   Flaxseed, Linseed, 1000 MG CAPS Take by mouth. (Patient not taking: Reported on 02/25/2024)   glucose blood test strip Use as instructed (Patient not taking: Reported on 02/25/2024)   Lancets (ONETOUCH ULTRASOFT) lancets Use as instructed (Patient not taking: Reported on 02/25/2024)   [DISCONTINUED] amoxicillin -clavulanate (AUGMENTIN ) 875-125  MG tablet Take 1 tablet by mouth 2 (two) times daily. (Patient not taking: Reported on 02/25/2024)   [DISCONTINUED] cephALEXin  (KEFLEX ) 500 MG capsule Take 1 capsule (500 mg total) by mouth 2 (two) times daily. (Patient not taking: Reported on 02/25/2024)   [DISCONTINUED] meloxicam  (MOBIC ) 15 MG tablet Take 15 mg by mouth daily. (Patient not taking: Reported on 02/25/2024)   No facility-administered encounter medications on file as of 02/25/2024.    No Known Allergies  Pertinent ROS per HPI, otherwise unremarkable      Objective:  BP 111/73   Pulse 76   Temp (!) 97.5 F (36.4 C) (Temporal)   Ht 5' 4 (1.626 m)   Wt 118 lb (53.5 kg)   SpO2 97%   BMI 20.25 kg/m    Wt Readings from Last 3 Encounters:  02/25/24 118 lb (53.5 kg)  09/11/22 120 lb (54.4 kg)  06/21/22 125 lb (56.7 kg)    Physical Exam Vitals and nursing note reviewed.  Constitutional:      Appearance: She is not ill-appearing.  HENT:     Head: Normocephalic and atraumatic.     Right Ear: Tympanic membrane, ear canal and external ear normal. There is no impacted cerumen.     Left Ear: Tympanic membrane, ear canal and external ear normal.     Ears:     Comments: Hearing loss noted bilaterally     Nose: Nose normal.     Mouth/Throat:     Lips: Pink.     Mouth: Mucous membranes are moist.     Comments: No teeth Eyes:     General: No scleral icterus.    Extraocular Movements: Extraocular movements intact.     Conjunctiva/sclera: Conjunctivae normal.     Pupils: Pupils are equal, round, and reactive to light.  Pulmonary:     Effort: Pulmonary effort is normal.     Breath sounds: Normal breath sounds.  Abdominal:     General: Bowel sounds are normal.     Palpations: Abdomen is soft.     Tenderness: There is no right CVA tenderness or left CVA tenderness.  Musculoskeletal:        General: Normal range of motion.     Right  lower leg: No edema.     Left lower leg: No edema.  Skin:    General: Skin is warm and  dry.     Findings: No lesion or rash.  Neurological:     Mental Status: She is alert and oriented to person, place, and time.  Psychiatric:        Attention and Perception: Attention and perception normal.        Mood and Affect: Mood normal.        Speech: Speech normal.        Behavior: Behavior normal. Behavior is cooperative.        Thought Content: Thought content normal. Thought content does not include suicidal ideation. Thought content does not include suicidal plan.        Cognition and Memory: Cognition normal. Memory is impaired.        Judgment: Judgment normal.    Physical Exam    EKG 02/25/2024:NSR  Rate: 72 BPM PR: 180 msec QT: 362 msec QTcH 383 msec msec QRSD: 81 msec P-QRS-T: 33/12/36 degree    Results for orders placed or performed during the hospital encounter of 12/25/22  CBC with Differential   Collection Time: 12/25/22 10:35 AM  Result Value Ref Range   WBC 6.1 4.0 - 10.5 K/uL   RBC 3.81 (L) 3.87 - 5.11 MIL/uL   Hemoglobin 11.2 (L) 12.0 - 15.0 g/dL   HCT 64.8 (L) 63.9 - 53.9 %   MCV 92.1 80.0 - 100.0 fL   MCH 29.4 26.0 - 34.0 pg   MCHC 31.9 30.0 - 36.0 g/dL   RDW 86.9 88.4 - 84.4 %   Platelets 236 150 - 400 K/uL   nRBC 0.0 0.0 - 0.2 %   Neutrophils Relative % 65 %   Neutro Abs 4.0 1.7 - 7.7 K/uL   Lymphocytes Relative 23 %   Lymphs Abs 1.4 0.7 - 4.0 K/uL   Monocytes Relative 10 %   Monocytes Absolute 0.6 0.1 - 1.0 K/uL   Eosinophils Relative 1 %   Eosinophils Absolute 0.1 0.0 - 0.5 K/uL   Basophils Relative 0 %   Basophils Absolute 0.0 0.0 - 0.1 K/uL   Immature Granulocytes 1 %   Abs Immature Granulocytes 0.03 0.00 - 0.07 K/uL  Basic metabolic panel   Collection Time: 12/25/22 10:35 AM  Result Value Ref Range   Sodium 138 135 - 145 mmol/L   Potassium 5.2 (H) 3.5 - 5.1 mmol/L   Chloride 105 98 - 111 mmol/L   CO2 25 22 - 32 mmol/L   Glucose, Bld 112 (H) 70 - 99 mg/dL   BUN 32 (H) 8 - 23 mg/dL   Creatinine, Ser 8.74 (H) 0.44 - 1.00  mg/dL   Calcium  9.4 8.9 - 10.3 mg/dL   GFR, Estimated 41 (L) >60 mL/min   Anion gap 8 5 - 15       Pertinent labs & imaging results that were available during my care of the patient were reviewed by me and considered in my medical decision making.  Assessment & Plan:  Debra Villarreal was seen today for establish care.  Diagnoses and all orders for this visit:  Encounter for general adult medical examination with abnormal findings -     Comprehensive metabolic panel with GFR -     Lipid panel -     Thyroid  Panel With TSH -     Bayer DCA Hb A1c Waived -     Anemia Profile B -  Ambulatory referral to Cardiology -     VITAMIN D  25 Hydroxy (Vit-D Deficiency, Fractures)  Syncope and collapse -     EKG 12-Lead  Other hypertrophic cardiomyopathy (HCC) -     Ambulatory referral to Cardiology -     EKG 12-Lead  Hypertriglyceridemia -     Lipid panel  Moderate late onset Alzheimer's dementia, unspecified whether behavioral, psychotic, or mood disturbance or anxiety (HCC) -     Vitamin B12 -     For home use only DME Other see comment  Anemia, unspecified type -     Anemia Profile B  Anxiety -     Thyroid  Panel With TSH  Hx of diabetes insipidus -     Bayer DCA Hb A1c Waived  Diabetic lipidosis (HCC) -     Comprehensive metabolic panel with GFR  Vitamin D  deficiency -     VITAMIN D  25 Hydroxy (Vit-D Deficiency, Fractures)  Encounter for immunization -     Flu vaccine HIGH DOSE PF(Fluzone Trivalent)    Debra Villarreal is a 89 year old Caucasian female seen today to establish care, no acute distress Assessment and Plan Assessment & Plan Adult Wellness Visit Routine adult wellness visit to establish care. - Administer flu shot. - Schedule appointment for pneumonia vaccination. - Order DEXA scan for osteoporosis.  Hypertrophic cardiomyopathy Experiences syncope when upset. No current cardiologist. - Refer to cardiologist for evaluation. - EKG  Type 2 diabetes  mellitus Currently on metformin 500 mg daily. Will assess need for continuation based on A1c results. - Order A1c test. - Evaluate A1c results to determine need for metformin.  Osteoporosis Previously treated with Fosamax , which she has discontinued. - Order DEXA scan.  Vitamin D  deficiency Will check current vitamin D  levels. - Order vitamin D  level test.  Anemia Current status to be evaluated with lab work. - Order CBC and full anemia panel.  MDD/anxiety restart: Restart Zoloft 25 Labs: CBC, CMP, lipid, TSH, vitamin D  result pending Alzheimer's dementia: Continue benazepril  5 mg daily, no refill needed  Continue all other maintenance medications.  Follow up plan: Return in about 4 months (around 06/26/2024) for chronic Diseases Management.   Continue healthy lifestyle choices, including diet (rich in fruits, vegetables, and lean proteins, and low in salt and simple carbohydrates) and exercise (at least 30 minutes of moderate physical activity daily).  Educational handout given for  Anemia  Anemia is a condition in which there are not enough red blood cells or hemoglobin in the blood. Hemoglobin is a substance in red blood cells that carries oxygen. When you do not have enough red blood cells or hemoglobin (are anemic), your body cannot get enough oxygen, and your organs may not work properly. As a result, you may feel very tired or have other problems. What are the causes? Common causes of anemia include: Excessive bleeding. Anemia can be caused by excessive bleeding inside or outside the body, including bleeding from the intestines or from heavy menstrual periods in females. Poor nutrition. Long-lasting (chronic) kidney, thyroid , and liver disease. Bone marrow disorders, spleen problems, and blood disorders. Cancer and treatments for cancer. Human immunodeficiency virus (HIV) and acquired immunodeficiency syndrome (AIDS). Infections, medicines, and autoimmune disorders  that destroy red blood cells. What are the signs or symptoms? Symptoms of this condition include: Minor weakness. Dizziness. Headache, or difficulties concentrating and sleeping. Heartbeats that feel irregular or faster than normal (palpitations). Shortness of breath, especially with exercise. Pale skin, lips, and nails, or cold hands and feet.  Upset stomach (indigestion) and nausea. Symptoms may occur suddenly or develop slowly. If your anemia is mild, you may not have symptoms. How is this diagnosed? This condition is diagnosed based on blood tests, your medical history, and a physical exam. In some cases, a test may be needed in which cells are removed from the soft tissue inside of a bone and looked at under a microscope (bone marrow biopsy). Your health care provider may also check your stool (feces) for blood and may do more testing to look for the cause of your bleeding. Other tests may include: Imaging tests, such as a CT scan or MRI. A procedure to see inside your esophagus and stomach (endoscopy). The esophagus is the part of the body that moves food from your mouth to your stomach. A procedure to see inside your colon and rectum (colonoscopy). How is this treated? Treatment for this condition depends on the cause. If you continue to lose a lot of blood, you may need to be treated at a hospital. Treatment may include: Taking supplements of iron, vitamin B12, or folic acid. Taking a hormone medicine (erythropoietin) that can help to stimulate red blood cell growth. Receiving donated blood through an IV (blood transfusion). This may be needed if you lose a lot of blood. Making changes to your diet. Having surgery to remove your spleen. Follow these instructions at home: Take over-the-counter and prescription medicines only as told by your health care provider. Take supplements only as told by your health care provider. Follow any diet instructions that you were given by your health  care provider. Keep all follow-up visits. Your health care provider will want to recheck your blood tests. Contact a health care provider if: You develop new bleeding anywhere in the body. You are very weak. Get help right away if: You are short of breath. You have pain in your abdomen or chest. You are dizzy or feel faint. You have trouble concentrating. You have bloody stools, black stools, or tarry stools. You vomit repeatedly or you vomit up blood. These symptoms may be an emergency. Get help right away. Call 911. Do not wait to see if the symptoms will go away. Do not drive yourself to the hospital. Summary Anemia is a condition in which you do not have enough red blood cells or enough of a substance in your red blood cells that carries oxygen. Symptoms may occur suddenly or develop slowly. If your anemia is mild, you may not have symptoms. This condition is diagnosed with blood tests, a medical history, and a physical exam. Other tests may be needed. Treatment for this condition depends on the cause of the anemia. This information is not intended to replace advice given to you by your health care provider. Make sure you discuss any questions you have with your health care provider. Document Revised: 08/22/2021 Document Reviewed: 08/22/2021 Elsevier Patient Education  2024 [employer]. Managing Anxiety, Adult After being diagnosed with anxiety, you may be relieved to know why you have felt or behaved a certain way. You may also feel overwhelmed about the treatment ahead and what it will mean for your life. With care and support, you can manage your anxiety. How to manage lifestyle changes Understanding the difference between stress and anxiety Although stress can play a role in anxiety, it is not the same as anxiety. Stress is your body's reaction to life changes and events, both good and bad. Stress is often caused by something external, such as a deadline,  test, or competition.  It normally goes away after the event has ended and will last just a few hours. But, stress can be ongoing and can lead to more than just stress. Anxiety is caused by something internal, such as imagining a terrible outcome or worrying that something will go wrong that will greatly upset you. Anxiety often does not go away even after the event is over, and it can become a long-term (chronic) worry. Lowering stress and anxiety Talk with your health care provider or a counselor to learn more about lowering anxiety and stress. They may suggest tension-reduction techniques, such as: Music. Spend time creating or listening to music that you enjoy and that inspires you. Mindfulness-based meditation. Practice being aware of your normal breaths while not trying to control your breathing. It can be done while sitting or walking. Centering prayer. Focus on a word, phrase, or sacred image that means something to you and brings you peace. Deep breathing. Expand your stomach and inhale slowly through your nose. Hold your breath for 3-5 seconds. Then breathe out slowly, letting your stomach muscles relax. Self-talk. Learn to notice and spot thought patterns that lead to anxiety reactions. Change those patterns to thoughts that feel peaceful. Muscle relaxation. Take time to tense muscles and then relax them. Choose a tension-reduction technique that fits your lifestyle and personality. These techniques take time and practice. Set aside 5-15 minutes a day to do them. Specialized therapists can offer counseling and training in these techniques. The training to help with anxiety may be covered by some insurance plans. Other things you can do to manage stress and anxiety include: Keeping a stress diary. This can help you learn what triggers your reaction and then learn ways to manage your response. Thinking about how you react to certain situations. You may not be able to control everything, but you can control your  response. Making time for activities that help you relax and not feeling guilty about spending your time in this way. Doing visual imagery. This involves imagining or creating mental pictures to help you relax. Practicing yoga. Through yoga poses, you can lower tension and relax.   Medicines Medicines for anxiety include: Antidepressant medicines. These are usually prescribed for long-term daily control. Anti-anxiety medicines. These may be added in severe cases, especially when panic attacks occur. When used together, medicines, psychotherapy, and tension-reduction techniques may be the most effective treatment. Relationships Relationships can play a big part in helping you recover. Spend more time connecting with trusted friends and family members. Think about going to couples counseling if you have a partner, taking family education classes, or going to family therapy. Therapy can help you and others better understand your anxiety. How to recognize changes in your anxiety Everyone responds differently to treatment for anxiety. Recovery from anxiety happens when symptoms lessen and stop interfering with your daily life at home or work. This may mean that you will start to: Have better concentration and focus. Worry will interfere less in your daily thinking. Sleep better. Be less irritable. Have more energy. Have improved memory. Try to recognize when your condition is getting worse. Contact your provider if your symptoms interfere with home or work and you feel like your condition is not improving. Follow these instructions at home: Activity Exercise. Adults should: Exercise for at least 150 minutes each week. The exercise should increase your heart rate and make you sweat (moderate-intensity exercise). Do strengthening exercises at least twice a week. Get the right amount and  quality of sleep. Most adults need 7-9 hours of sleep each night. Lifestyle  Eat a healthy diet that includes  plenty of vegetables, fruits, whole grains, low-fat dairy products, and lean protein. Do not eat a lot of foods that are high in fats, added sugars, or salt (sodium). Make choices that simplify your life. Do not use any products that contain nicotine or tobacco. These products include cigarettes, chewing tobacco, and vaping devices, such as e-cigarettes. If you need help quitting, ask your provider. Avoid caffeine, alcohol, and certain over-the-counter cold medicines. These may make you feel worse. Ask your pharmacist which medicines to avoid. General instructions Take over-the-counter and prescription medicines only as told by your provider. Keep all follow-up visits. This is to make sure you are managing your anxiety well or if you need more support. Where to find support You can get help and support from: Self-help groups. Online and [employer]. A trusted spiritual leader. Couples counseling. Family education classes. Family therapy. Where to find more information You may find that joining a support group helps you deal with your anxiety. The following sources can help you find counselors or support groups near you: Mental Health [place]: mentalhealthamerica.net Anxiety and [employer] of [place] (ADAA): adaa.org [employer] Mental Illness (NAMI): nami.org Contact a health care provider if: You have a hard time staying focused or finishing tasks. You spend many hours a day feeling worried about everyday life. You are very tired because you cannot stop worrying. You start to have headaches or often feel tense. You have chronic nausea or diarrhea. Get help right away if: Your heart feels like it is racing. You have shortness of breath. You have thoughts of hurting yourself or others. Get help right away if you feel like you may hurt yourself or others, or have thoughts about taking your own life. Go to your nearest emergency room or: Call 911. Call  the National Suicide Prevention Lifeline at 458-346-8270 or 988. This is open 24 hours a day. Text the Crisis Text Line at 4327724164. This information is not intended to replace advice given to you by your health care provider. Make sure you discuss any questions you have with your health care provider. Document Revised: 03/07/2022 Document Reviewed: 09/19/2020 Elsevier Patient Education  2024 Else[employer] Inc. Muscle Weakness (Myopathy): What to Know Myopathy means that your muscles are weak and don't work well. There are many types of myopathy. Your type may be: Inherited. This means it was passed down in your family. Symptoms may start at birth or when you're young. Acquired. This means it was caused by something else. Symptoms may start all of a sudden at any age. Most myopathies don't have a cure. What are the causes? Common causes include: Certain medicines, such as those that lower cholesterol. Problems with your metabolism. Irritation and swelling of your muscles. This may be caused by a problem with your body's defense system (immune system). An infection in your muscles. This may be caused by a germ called a virus. Problems with your thyroid . In some cases, the cause isn't known. What increases the risk? You're more likely to get myopathy if: Other family members have it. You take medicines called statins to lower your cholesterol. What are the signs or symptoms? Symptoms can be mild or very bad. They may include: Weak muscles. Cramps. Sudden muscle tightening (spasms). Falls or having trouble walking. Dropping things. Stiffness. Symptoms often happen in muscles close to your body's center. One muscle group may  be more affected than others. Other symptoms include: Muscle pain or tenderness. Muscle weakness. This may get worse over time. Heart problems. Trouble: Breathing. Swallowing. Seeing. You may see double. Feeling very tired. How is this diagnosed? You may be  diagnosed based on your medical history and some tests. The tests may include: Blood tests. Biopsy. This is when a small piece of muscle tissue is removed for testing. Electromyogram (EMG). MRI. Electrocardiogram (ECG). Tests of your genes. How is this treated? Treatment depends on what type of myopathy you have. It may include: Medicines to: Lessen pain. Help with diseases. Help your immune system. Physical therapy. A brace to support your muscles. A cane, walker, or wheelchair. Follow these instructions at home: If you have a brace: Wear the brace as told. Take it off only if your health care provider says you can. Check the skin around it every day. Tell your provider if you see problems. Loosen the brace if any part of your body tingles, is numb, or turns cold and blue. Keep the brace clean. If the brace isn't waterproof: Do not let it get wet. Cover it when you take a bath or shower. Use a cover that doesn't let any water  in. Ask when it's safe to drive if you're wearing a brace. General instructions Take your medicines only as told. Stay at a healthy weight. Eat, drink, and exercise as told. Keep all follow-up visits. Your provider will check how you're doing and change your treatments if needed. Contact a health care provider if: You have trouble dealing with your symptoms at home. You have a fever. Get help right away if: You have trouble breathing. You have chest pain. These symptoms may be an emergency. Call 911 right away. Do not wait to see if the symptoms will go away. Do not drive yourself to the hospital. This information is not intended to replace advice given to you by your health care provider. Make sure you discuss any questions you have with your health care provider. Document Revised: 07/03/2023 Document Reviewed: 07/03/2023 Elsevier Patient Education  2025 [employer]. Dyslipidemia Dyslipidemia is an imbalance of waxy, fat-like substances (lipids)  in the blood. The body needs lipids in small amounts. Dyslipidemia often involves a high level of cholesterol or triglycerides, which are types of lipids. Common forms of dyslipidemia include: High levels of LDL cholesterol. LDL is the type of cholesterol that causes fatty deposits (plaques) to build up in the blood vessels that carry blood away from the heart (arteries). Low levels of HDL cholesterol. HDL cholesterol is the type of cholesterol that protects against heart disease. High levels of HDL remove the LDL buildup from arteries. High levels of triglycerides. Triglycerides are a fatty substance in the blood that is linked to a buildup of plaques in the arteries. What are the causes? There are two main types of dyslipidemia: primary and secondary. Primary dyslipidemia is caused by changes (mutations) in genes that are passed down through families (inherited). These mutations cause several types of dyslipidemia. Secondary dyslipidemia may be caused by various risk factors that can lead to the disease, such as lifestyle choices and certain medical conditions. What increases the risk? You are more likely to develop this condition if you are an older man or if you are a woman who has gone through menopause. Other risk factors include: Having a family history of dyslipidemia. Taking certain medicines, including birth control pills, steroids, some diuretics, and beta-blockers. Eating a diet high in saturated fat. Smoking  cigarettes or excessive alcohol intake. Having certain medical conditions such as diabetes, polycystic ovary syndrome (PCOS), kidney disease, liver disease, or hypothyroidism. Not exercising regularly. Being overweight or obese with too much belly fat. What are the signs or symptoms? In most cases, dyslipidemia does not usually cause any symptoms. In severe cases, very high lipid levels can cause: Fatty bumps under the skin (xanthomas). A white or gray ring around the black  center (pupil) of the eye. Very high triglyceride levels can cause inflammation of the pancreas (pancreatitis). How is this diagnosed? Your health care provider may diagnose dyslipidemia based on a routine blood test (fasting blood test). Because most people do not have symptoms of the condition, this blood testing (lipid profile) is done on adults age 7 and older and is repeated every 4-6 years. This test checks: Total cholesterol. This measures the total amount of cholesterol in your blood, including LDL cholesterol, HDL cholesterol, and triglycerides. A healthy number is below 200 mg/dL (4.82 mmol/L). LDL cholesterol. The target number for LDL cholesterol is different for each person, depending on individual risk factors. A healthy number is usually below 100 mg/dL (7.40 mmol/L). Ask your health care provider what your LDL cholesterol should be. HDL cholesterol. An HDL level of 60 mg/dL (8.44 mmol/L) or higher is best because it helps to protect against heart disease. A number below 40 mg/dL (8.96 mmol/L) for men or below 50 mg/dL (8.70 mmol/L) for women increases the risk for heart disease. Triglycerides. A healthy triglyceride number is below 150 mg/dL (8.30 mmol/L). If your lipid profile is abnormal, your health care provider may do other blood tests. How is this treated? Treatment depends on the type of dyslipidemia that you have and your other risk factors for heart disease and stroke. Your health care provider will have a target range for your lipid levels based on this information. Treatment for dyslipidemia starts with lifestyle changes, such as diet and exercise. Your health care provider may recommend that you: Get regular exercise. Make changes to your diet. Quit smoking if you smoke. Limit your alcohol intake. If diet changes and exercise do not help you reach your goals, your health care provider may also prescribe medicine to lower lipids. The most commonly prescribed type of  medicine lowers your LDL cholesterol (statin drug). If you have a high triglyceride level, your provider may prescribe another type of drug (fibrate) or an omega-3 fish oil supplement, or both. Follow these instructions at home: Eating and drinking  Follow instructions from your health care provider or dietitian about eating or drinking restrictions. Eat a healthy diet as told by your health care provider. This can help you reach and maintain a healthy weight, lower your LDL cholesterol, and raise your HDL cholesterol. This may include: Limiting your calories, if you are overweight. Eating more fruits, vegetables, whole grains, fish, and lean meats. Limiting saturated fat, trans fat, and cholesterol. Do not drink alcohol if: Your health care provider tells you not to drink. You are pregnant, may be pregnant, or are planning to become pregnant. If you drink alcohol: Limit how much you have to: 0-1 drink a day for women. 0-2 drinks a day for men. Know how much alcohol is in your drink. In the [place] one drink equals one 12 oz bottle of beer (355 mL), one 5 oz glass of wine (148 mL), or one 1 oz glass of hard liquor (44 mL). Activity Get regular exercise. Start an exercise and strength training program as told  by your health care provider. Ask your health care provider what activities are safe for you. Your health care provider may recommend: 30 minutes of aerobic activity 4-6 days a week. Brisk walking is an example of aerobic activity. Strength training 2 days a week. General instructions Do not use any products that contain nicotine or tobacco. These products include cigarettes, chewing tobacco, and vaping devices, such as e-cigarettes. If you need help quitting, ask your health care provider. Take over-the-counter and prescription medicines only as told by your health care provider. This includes supplements. Keep all follow-up visits. This is important. Contact a health care provider  if: You are having trouble sticking to your exercise or diet plan. You are struggling to quit smoking or to control your use of alcohol. Summary Dyslipidemia often involves a high level of cholesterol or triglycerides, which are types of lipids. Treatment depends on the type of dyslipidemia that you have and your other risk factors for heart disease and stroke. Treatment for dyslipidemia starts with lifestyle changes, such as diet and exercise. Your health care provider may prescribe medicine to lower lipids. This information is not intended to replace advice given to you by your health care provider. Make sure you discuss any questions you have with your health care provider. Document Revised: 12/30/2021 Document Reviewed: 08/02/2020 Elsevier Patient Education  2025 [employer]. Alzheimer's Disease Alzheimer's disease is a disease that affects the way your brain works. It affects memory and causes changes in how you think, talk, and act. The disease gets worse over time. Alzheimer's disease is a form of dementia. What are the causes? Alzheimer's disease happens when a protein called beta-amyloid forms deposits in the brain. It's not known what causes these to form. The disease may also be caused by a harmful change in a gene that's passed down, or inherited, from one or both parents. Not everyone who gets the changed gene from their parents will get the disease. What increases the risk? Being older than 88 years of age. Being female. Having any of these conditions: High blood pressure. Diabetes. Heart or blood vessel disease. Smoking. Being very overweight. Having a brain injury or a stroke in the past. Having a family history of dementia. What are the signs or symptoms?  Symptoms may happen in three stages, which often overlap. Early stage In this stage, you can still do things on your own. You may still be able to drive, work, and be social. Symptoms in this stage  include: Forgetting small things, like a name, words, or what you did recently. Having a hard time with: Paying attention or learning new things. Talking with people. Doing your usual tasks. Solving problems or doing math. Following instructions. Feeling worried or nervous. Not wanting to be around people. Losing interest in doing things. Moderate stage In this stage, you'll start to need care. Symptoms include: Trouble saying what you're thinking. Memory loss that affects daily life. This can include forgetting: Things that happened recently. If you've taken medicines or eaten. Places you know. You may get lost while walking or driving. To pay bills. To bathe or use the bathroom. Being confused about where you are or what time it is. Trouble judging distance. Changes in how you feel or act. You may be moody, angry, upset, scared, worried, or suspicious. Not thinking clearly or making good choices. You may have delusions or false beliefs. Hallucinations. This means you see, hear, taste, smell, or feel things that aren't real. Severe stage In the  severe stage, you'll need help with your personal care and daily activities. Symptoms include: Memory loss getting worse. Personality changes. Not knowing where you are. Physical problems, like trouble walking, sitting, or swallowing. More trouble talking with others. Not being able to control when you pee (urinate) or poop. More changes in how you act. How is this diagnosed? You may need to see a nervous system specialist, called a neurologist, or a health care provider who focuses on the care of older adults. Alzheimer's disease may be diagnosed based on: Your symptoms and medical history. Your provider will talk with you and your family, friends, and caregivers about your history and symptoms. A physical exam. Tests. These may include: Lab tests, such as tests on your blood or pee. Imaging tests. You may have a CT scan, a PET scan,  or an MRI. A lumbar puncture. For this test, a sample of the fluid around the brain and spinal cord is taken and tested. Tests to check your thinking and memory. Genetic testing. This may be done if you get the disease before age 40 or if other family members have had the disease. How is this treated? At this time, there's no cure for Alzheimer's disease. The goals of treatment are to: Manage symptoms that affect behavior. Make sure you're safe at home. Help manage daily life for you and your caregivers. Treatment may include: Medicines. You may get medicines that can: Slow down how fast the disease gets worse. Help with memory and behavior. Cognitive therapy. This gives you support, training to help with thinking skills, and memory aids. Counseling or spiritual guidance. These can help you deal with the many feelings you may have, such as fear, anger, or feeling alone. Caregivers. These are people who help you with your daily tasks. Family support groups. These allow your family members to learn about the disease, get emotional support, and find out about resources to help take care of you. Follow these instructions at home:  Medicines Take over-the-counter and prescription medicines only as told by your provider. Use a pill organizer or pill reminder to help you keep track of your medicines. Avoid taking medicines that can affect thinking, such as medicines for pain or sleep. Lifestyle Make healthy choices: Be active as told by your provider. Regular exercise may help with symptoms. Do not smoke, vape, or use products with nicotine or tobacco in them. Do not drink alcohol. Eat a healthy diet. When you feel a lot of stress, do something that helps you relax. Try things like yoga or deep breathing. Spend time with people. Drink enough fluid to keep your pee pale yellow. Make sure you sleep well. These tips can help: Try not to take long naps during the day. Take short naps of 30  minutes or less if needed. Keep your bedroom dark and cool. Do not exercise during the few hours before you go to bed. Avoid caffeine products in the afternoon and evening. General instructions Work with your provider to decide: What things you need help with. What your safety needs are. If you were given a bracelet that tracks where you are and shows that you're a person with memory loss, make sure you wear it at all times. Talk with your provider about whether it's safe for you to drive. Work with your family to make big legal or health decisions. This may include things like advance directives, medical power of attorney, or a living will. Where to find more information There are two  ways to contact the Alzheimer's Association: Call the 24-hour helpline at 737-831-3546. Visit [employer] Contact a health care provider if: Your medicine causes you to have nausea, vomiting, or trouble with eating. You have mood or behavior changes that are getting worse, such as feeling depressed, worried, or nervous. You have hallucinations. You or your family members are worried about your safety. You're hard to wake up. Your memory suddenly gets worse. Get help right away if: You feel like you may hurt yourself or others. You have thoughts about taking your own life. Take one of these steps: Go to your nearest emergency room. Call 911. Call the National Suicide Prevention Lifeline at 208 871 9712 or 988. Text the Crisis Text Line at 719-477-0675. This information is not intended to replace advice given to you by your health care provider. Make sure you discuss any questions you have with your health care provider. Document Revised: 08/29/2022 Document Reviewed: 08/29/2022 Elsevier Patient Education  2024 Elsevier Inc.  The above assessment and management plan was discussed with the patient. The patient verbalized understanding of and has agreed to the management plan. Patient is aware to call the  clinic if they develop any new symptoms or if symptoms persist or worsen. Patient is aware when to return to the clinic for a follow-up visit. Patient educated on when it is appropriate to go to the emergency department.  Malaiya Paczkowski [place] Thompson, DNP [facility] [address] [place], [place] [address] [phone]

## 2024-02-26 ENCOUNTER — Ambulatory Visit: Payer: Self-pay | Admitting: Nurse Practitioner

## 2024-02-26 LAB — COMPREHENSIVE METABOLIC PANEL WITH GFR
ALT: 7 IU/L (ref 0–32)
AST: 17 IU/L (ref 0–40)
Albumin: 4.1 g/dL (ref 3.7–4.7)
Alkaline Phosphatase: 57 IU/L (ref 48–129)
BUN/Creatinine Ratio: 19 (ref 12–28)
BUN: 20 mg/dL (ref 8–27)
Bilirubin Total: 0.3 mg/dL (ref 0.0–1.2)
CO2: 22 mmol/L (ref 20–29)
Calcium: 9.6 mg/dL (ref 8.7–10.3)
Chloride: 103 mmol/L (ref 96–106)
Creatinine, Ser: 1.05 mg/dL — ABNORMAL HIGH (ref 0.57–1.00)
Globulin, Total: 2.7 g/dL (ref 1.5–4.5)
Glucose: 101 mg/dL — ABNORMAL HIGH (ref 70–99)
Potassium: 5.3 mmol/L — ABNORMAL HIGH (ref 3.5–5.2)
Sodium: 141 mmol/L (ref 134–144)
Total Protein: 6.8 g/dL (ref 6.0–8.5)
eGFR: 51 mL/min/1.73 — ABNORMAL LOW (ref >59)

## 2024-02-26 LAB — ANEMIA PROFILE B
Basophils Absolute: 0 x10E3/uL (ref 0.0–0.2)
Basos: 0 %
EOS (ABSOLUTE): 0.1 x10E3/uL (ref 0.0–0.4)
Eos: 1 %
Ferritin: 60 ng/mL (ref 15–150)
Folate: 6.5 ng/mL (ref >3.0)
Hematocrit: 36 % (ref 34.0–46.6)
Hemoglobin: 11.5 g/dL (ref 11.1–15.9)
Immature Grans (Abs): 0 x10E3/uL (ref 0.0–0.1)
Immature Granulocytes: 0 %
Iron Saturation: 24 % (ref 15–55)
Iron: 64 ug/dL (ref 27–139)
Lymphocytes Absolute: 1.7 x10E3/uL (ref 0.7–3.1)
Lymphs: 23 %
MCH: 29.6 pg (ref 26.6–33.0)
MCHC: 31.9 g/dL (ref 31.5–35.7)
MCV: 93 fL (ref 79–97)
Monocytes Absolute: 0.8 x10E3/uL (ref 0.1–0.9)
Monocytes: 11 %
Neutrophils Absolute: 4.8 x10E3/uL (ref 1.4–7.0)
Neutrophils: 65 %
Platelets: 247 x10E3/uL (ref 150–450)
RBC: 3.88 x10E6/uL (ref 3.77–5.28)
RDW: 12.5 % (ref 11.7–15.4)
Retic Ct Pct: 1.1 % (ref 0.6–2.6)
Total Iron Binding Capacity: 266 ug/dL (ref 250–450)
UIBC: 202 ug/dL (ref 118–369)
Vitamin B-12: 428 pg/mL (ref 232–1245)
WBC: 7.5 x10E3/uL (ref 3.4–10.8)

## 2024-02-26 LAB — LIPID PANEL
Chol/HDL Ratio: 2.6 ratio (ref 0.0–4.4)
Cholesterol, Total: 151 mg/dL (ref 100–199)
HDL: 57 mg/dL (ref >39)
LDL Chol Calc (NIH): 65 mg/dL (ref 0–99)
Triglycerides: 173 mg/dL — ABNORMAL HIGH (ref 0–149)
VLDL Cholesterol Cal: 29 mg/dL (ref 5–40)

## 2024-02-26 LAB — THYROID PANEL WITH TSH
Free Thyroxine Index: 2.7 (ref 1.2–4.9)
T3 Uptake Ratio: 29 % (ref 24–39)
T4, Total: 9.2 ug/dL (ref 4.5–12.0)
TSH: 0.295 u[IU]/mL — ABNORMAL LOW (ref 0.450–4.500)

## 2024-02-26 LAB — VITAMIN D 25 HYDROXY (VIT D DEFICIENCY, FRACTURES): Vit D, 25-Hydroxy: 36.8 ng/mL (ref 30.0–100.0)

## 2024-02-28 NOTE — Telephone Encounter (Signed)
 LMOV[facility] handicap form is ready

## 2024-03-04 ENCOUNTER — Telehealth: Payer: Self-pay

## 2024-03-04 NOTE — Telephone Encounter (Signed)
 Tried to update result encounter and unable to access encounter due to it being closed.

## 2024-03-04 NOTE — Telephone Encounter (Signed)
 Copied from CRM [id number]. Topic: Clinical - Lab/Test Results >> Mar 04, 2024 10:02 AM Corin V wrote: Reason for CRM: Patient called to get lab results. Read provider note verbatim. Patient verbalized understanding and has no additional questions or concerns at this time. She did not want to schedule labs at time of call. She asked that we call [phone] to schedule

## 2024-04-21 ENCOUNTER — Encounter: Payer: Self-pay | Admitting: Cardiology

## 2024-05-05 ENCOUNTER — Encounter: Admitting: Nurse Practitioner

## 2024-06-23 ENCOUNTER — Telehealth: Payer: Self-pay | Admitting: Nurse Practitioner

## 2024-06-23 NOTE — Telephone Encounter (Signed)
 I tried to call pt to r/s appt w/Sandra to another date bc she will not be here that day.

## 2024-06-26 ENCOUNTER — Ambulatory Visit: Payer: Self-pay | Admitting: Nurse Practitioner

## 2024-07-09 ENCOUNTER — Ambulatory Visit: Admitting: Family Medicine

## 2024-07-09 ENCOUNTER — Ambulatory Visit: Payer: Self-pay

## 2024-07-09 ENCOUNTER — Ambulatory Visit: Admitting: Nurse Practitioner

## 2024-07-09 ENCOUNTER — Encounter: Payer: Self-pay | Admitting: Family Medicine

## 2024-07-09 VITALS — BP 129/65 | HR 61 | Temp 97.3°F | Ht 64.0 in | Wt 117.8 lb

## 2024-07-09 DIAGNOSIS — F02B0 Dementia in other diseases classified elsewhere, moderate, without behavioral disturbance, psychotic disturbance, mood disturbance, and anxiety: Secondary | ICD-10-CM | POA: Diagnosis not present

## 2024-07-09 DIAGNOSIS — R410 Disorientation, unspecified: Secondary | ICD-10-CM | POA: Diagnosis not present

## 2024-07-09 DIAGNOSIS — S31000A Unspecified open wound of lower back and pelvis without penetration into retroperitoneum, initial encounter: Secondary | ICD-10-CM

## 2024-07-09 DIAGNOSIS — R7989 Other specified abnormal findings of blood chemistry: Secondary | ICD-10-CM | POA: Diagnosis not present

## 2024-07-09 DIAGNOSIS — R8290 Unspecified abnormal findings in urine: Secondary | ICD-10-CM | POA: Diagnosis not present

## 2024-07-09 DIAGNOSIS — G301 Alzheimer's disease with late onset: Secondary | ICD-10-CM

## 2024-07-09 LAB — BAYER DCA HB A1C WAIVED: HB A1C (BAYER DCA - WAIVED): 5.3 % (ref 4.8–5.6)

## 2024-07-09 MED ORDER — SERTRALINE HCL 25 MG PO TABS: 25.0000 mg | ORAL_TABLET | Freq: Every day | ORAL | 0 refills | Status: DC

## 2024-07-09 NOTE — Telephone Encounter (Signed)
 FYI Only or Action Required?: FYI only for provider: appointment scheduled on reschedule to this afternoon.  Patient was last seen in primary care on 02/25/2024 by Deitra Morton Sebastian Nena, NP.  Called Nurse Triage reporting Urinary Frequency.  Symptoms began 2 days ago.  Interventions attempted: Nothing.  Symptoms are: unchanged.  Triage Disposition: See Physician Within 24 Hours  Patient/caregiver understands and will follow disposition?: Yes  Reason for Disposition  Urinating more frequently than usual (i.e., frequency) OR new-onset of the feeling of an urgent need to urinate (i.e., urgency)  Answer Assessment - Initial Assessment Questions Patient daughter calling to reschedule appointment today to later time due to icy porch and unable to make it in time. Appointment rescheduled to another provider today due to acute symptoms. Patient is in need of TOC appointment with follow up on chronic conditions. Daughter and patient will discuss at upcoming acute visit which provider she would like to establish with and schedule at that time.  Daughter is unsure if patient is experiencing urgency, patient denied pain, afebrile, confused at baseline was worse yesterday and back to baseline today.   1. SYMPTOM: What's the main symptom you're concerned about? (e.g., frequency, incontinence)     odor 2. ONSET: When did the odor  start?     2 days ago 3. PAIN: Is there any pain? If Yes, ask: How bad is it? (Scale: 1-10; mild, moderate, severe)     denies 4. CAUSE: What do you think is causing the symptoms?     uti 5. OTHER SYMPTOMS: Do you have any other symptoms? (e.g., blood in urine, fever, flank pain, pain with urination)     Confused yesterday 6. PREGNANCY: Is there any chance you are pregnant? When was your last menstrual period?  Protocols used: Urinary Symptoms-A-AH  Message from [place] E sent at 07/09/2024  8:06 AM EST  Summary: Possible UTI Strong odor urine  Confused   Reason for Triage: Possible UTI Strong odor urine Confused

## 2024-07-09 NOTE — Progress Notes (Signed)
 "    Subjective:  Patient ID: Debra Villarreal, female    DOB: 12/28/1931, 89 y.o.   MRN: [id number]  Patient Care Team: Deitra Morton Hummer, Nena, NP as PCP - General (Nurse Practitioner) Lavona Agent, MD as PCP - Cardiology (Cardiology) Shaaron Lamar HERO, MD (Gastroenterology)   Chief Complaint:  Altered Mental Status (X 2 days ) and Sore (On bottom )   HPI: Debra Villarreal is a 89 y.o. female presenting on 07/09/2024 for Altered Mental Status (X 2 days ) and Sore (On bottom )    Debra Villarreal is a 89 year old female who presents with urinary symptoms and confusion.  She has been experiencing urinary symptoms for the past couple of weeks, characterized by malodorous urine. No fever or chills are present. She has a history of urinary tract infections, which tend to exacerbate her baseline confusion.  She has a sacral wound that appears red and bead-like. Her daughter has been applying Vaseline or a similar ointment to the area.  She has a history of being on metformin, although her A1c levels have consistently been in the normal range (5.2-5.5). Her daughter mentioned that a previous doctor had diagnosed her with diabetes, but current assessments do not support this diagnosis.  She is currently taking sertraline , but is running low on her supply. Her daughter brought the medication to the visit to discuss refills.          Relevant past medical, surgical, family, and social history reviewed and updated as indicated.  Allergies and medications reviewed and updated. Data reviewed: Chart in Epic.   Past Medical History:  Diagnosis Date   Anxiety    Arthritis    Bilateral cataracts    Hypertension    Takotsubo cardiomyopathy 2007   Normary coronaries 2007, initial EF 18% improved to 60%    Type 2 diabetes mellitus (HCC)    Uterine cancer [facility])     Past Surgical History:  Procedure Laterality Date   ABDOMINAL HYSTERECTOMY     1966   APPENDECTOMY     CATARACT  EXTRACTION W/PHACO  10/12/2011   Procedure: CATARACT EXTRACTION PHACO AND INTRAOCULAR LENS PLACEMENT (IOC);  Surgeon: Cherene Mania, MD;  Location: [facility];  Service: Ophthalmology;  Laterality: Left;  CDE:18.57   CATARACT EXTRACTION W/PHACO  10/23/2011   Procedure: CATARACT EXTRACTION PHACO AND INTRAOCULAR LENS PLACEMENT (IOC);  Surgeon: Cherene Mania, MD;  Location: [facility];  Service: Ophthalmology;  Laterality: Right;  CDE 18.09   CHOLECYSTECTOMY N/A 11/19/2020   Procedure: LAPAROSCOPIC CHOLECYSTECTOMY;  Surgeon: Mavis Anes, MD;  Location: [facility];  Service: General;  Laterality: N/A;   COLONOSCOPY  01/31/2012   Procedure: COLONOSCOPY;  Surgeon: Lamar HERO Shaaron, MD;  Location: [facility];  Service: Endoscopy;  Laterality: N/A;  1:45   LEFT HEART CATHETERIZATION WITH CORONARY ANGIOGRAM N/A 02/09/2012   Procedure: LEFT HEART CATHETERIZATION WITH CORONARY ANGIOGRAM;  Surgeon: Aleene JINNY Passe, MD;  Location: [facility] CATH LAB;  Service: Cardiovascular;  Laterality: N/A;    Social History   Socioeconomic History   Marital status: Widowed    Spouse name: Not on file   Number of children: Not on file   Years of education: Not on file   Highest education level: Not on file  Occupational History   Not on file  Tobacco Use   Smoking status: Never   Smokeless tobacco: Never  Vaping Use   Vaping status: Never Used  Substance and Sexual Activity   Alcohol  use: No   Drug use: No   Sexual activity: Not on file  Other Topics Concern   Not on file  Social History Narrative   Not on file   Social Drivers of Health   Tobacco Use: Low Risk (07/09/2024)   Patient History    Smoking Tobacco Use: Never    Smokeless Tobacco Use: Never    Passive Exposure: Not on file  Financial Resource Strain: Low Risk (05/08/2022)   Overall Financial Resource Strain (CARDIA)    Difficulty of Paying Living Expenses: Not hard at all  Food Insecurity: Unknown (02/25/2024)   Epic    Worried About Programme Researcher, Broadcasting/film/video in the  Last Year: Never true    [employer] of Food in the Last Year: Not on file  Transportation Needs: No Transportation Needs (02/25/2024)   Epic    Lack of Transportation (Medical): No    Lack of Transportation (Non-Medical): No  Physical Activity: Insufficiently Active (05/08/2022)   Exercise Vital Sign    Days of Exercise per Week: 3 days    Minutes of Exercise per Session: 30 min  Stress: No Stress Concern Present (05/08/2022)   [employer] of Occupational Health - Occupational Stress Questionnaire    Feeling of Stress : Not at all  Social Connections: Moderately Isolated (05/08/2022)   Social Connection and Isolation Panel    Frequency of Communication with Friends and Family: More than three times a week    Frequency of Social Gatherings with Friends and Family: More than three times a week    Attends Religious Services: 1 to 4 times per year    Active Member of [employer] or Organizations: No    Attends Banker Meetings: Never    Marital Status: Widowed  Intimate Partner Violence: Unknown (02/25/2024)   Epic    Fear of Current or Ex-Partner: Not on file    Emotionally Abused: No    Physically Abused: No    Sexually Abused: No  Depression (PHQ2-9): High Risk (02/25/2024)   Depression (PHQ2-9)    PHQ-2 Score: 11  Alcohol Screen: Low Risk (05/08/2022)   Alcohol Screen    Last Alcohol Screening Score (AUDIT): 0  Housing: Unknown (02/25/2024)   Epic    Unable to Pay for Housing in the Last Year: No    Number of Times Moved in the Last Year: Not on file    Homeless in the Last Year: No  Utilities: Not At Risk (05/08/2022)   [facility] Utilities    Threatened with loss of utilities: No  Health Literacy: Not on file    Outpatient Encounter Medications as of 07/09/2024  Medication Sig   acetaminophen  (TYLENOL ) 325 MG tablet Take 325 mg by mouth every 6 (six) hours as needed for mild pain or moderate pain.    alendronate  (FOSAMAX ) 70 MG tablet TAKE 1 TABLET BY MOUTH ONCE A WEEK  FOR  BONE  HEALTH   aspirin  EC 81 MG tablet Take 81 mg by mouth every 6 (six) hours as needed for moderate pain.   benazepril  (LOTENSIN ) 5 MG tablet TAKE 1 TABLET BY MOUTH DAILY   busPIRone  (BUSPAR ) 5 MG tablet Take 1 tablet (5 mg total) by mouth 2 (two) times daily.   Calcium  Citrate-Vitamin D  (CALCIUM  + D PO) Take 1 tablet by mouth 2 (two) times daily.   Flaxseed, Linseed, 1000 MG CAPS Take by mouth.   glucose blood test strip Use as instructed   Lancets (ONETOUCH ULTRASOFT) lancets Use as  instructed   metFORMIN (GLUCOPHAGE) 500 MG tablet Take 500 mg by mouth daily.   Omega-3 Fatty Acids (FISH OIL) 300 MG CAPS Take by mouth.   simvastatin  (ZOCOR ) 5 MG tablet Take 1 tablet (5 mg total) by mouth daily. Take 5 mg by mouth daily.   [DISCONTINUED] sertraline  (ZOLOFT ) 25 MG tablet Take 25 mg by mouth daily.   sertraline  (ZOLOFT ) 25 MG tablet Take 1 tablet (25 mg total) by mouth daily.   No facility-administered encounter medications on file as of 07/09/2024.    Allergies[1]  Pertinent ROS per HPI, otherwise unremarkable      Objective:  BP 129/65   Pulse 61   Temp (!) 97.3 F (36.3 C)   Ht 5' 4 (1.626 m)   Wt 117 lb 12.8 oz (53.4 kg)   SpO2 97%   BMI 20.22 kg/m    Wt Readings from Last 3 Encounters:  07/09/24 117 lb 12.8 oz (53.4 kg)  02/25/24 118 lb (53.5 kg)  09/11/22 120 lb (54.4 kg)    Physical Exam Vitals and nursing note reviewed.  Constitutional:      General: She is not in acute distress.    Appearance: She is ill-appearing (chronically ill, frail elderly). She is not toxic-appearing or diaphoretic.  HENT:     Head: Normocephalic and atraumatic.     Right Ear: Decreased hearing noted.     Left Ear: Decreased hearing noted.     Nose: Nose normal.     Mouth/Throat:     Mouth: Mucous membranes are moist.     Comments: Edentulous  Eyes:     Pupils: Pupils are equal, round, and reactive to light.  Cardiovascular:     Rate and Rhythm: Normal rate and regular  rhythm.     Heart sounds: Normal heart sounds.  Pulmonary:     Effort: Pulmonary effort is normal.     Breath sounds: Normal breath sounds.  Abdominal:     General: Bowel sounds are normal.     Palpations: Abdomen is soft.     Tenderness: There is no abdominal tenderness. There is no right CVA tenderness or left CVA tenderness.  Musculoskeletal:     Right lower leg: No edema.     Left lower leg: No edema.  Skin:    General: Skin is warm and dry.     Capillary Refill: Capillary refill takes less than 2 seconds.      Neurological:     General: No focal deficit present.     Mental Status: She is alert. Mental status is at baseline.  Psychiatric:        Attention and Perception: Attention normal.        Mood and Affect: Mood normal.        Speech: Speech normal.        Behavior: Behavior normal.        Cognition and Memory: Cognition is impaired. Memory is impaired.       Results for orders placed or performed in visit on 02/25/24  Bayer DCA Hb A1c Waived   Collection Time: 02/25/24 12:13 PM  Result Value Ref Range   HB A1C (BAYER DCA - WAIVED) 5.2 4.8 - 5.6 %  Comprehensive metabolic panel with GFR   Collection Time: 02/25/24 12:15 PM  Result Value Ref Range   Glucose 101 (H) 70 - 99 mg/dL   BUN 20 8 - 27 mg/dL   Creatinine, Ser 8.94 (H) 0.57 - 1.00 mg/dL   eGFR  51 (L) >59 mL/min/1.73   BUN/Creatinine Ratio 19 12 - 28   Sodium 141 134 - 144 mmol/L   Potassium 5.3 (H) 3.5 - 5.2 mmol/L   Chloride 103 96 - 106 mmol/L   CO2 22 20 - 29 mmol/L   Calcium  9.6 8.7 - 10.3 mg/dL   Total Protein 6.8 6.0 - 8.5 g/dL   Albumin 4.1 3.7 - 4.7 g/dL   Globulin, Total 2.7 1.5 - 4.5 g/dL   Bilirubin Total 0.3 0.0 - 1.2 mg/dL   Alkaline Phosphatase 57 48 - 129 IU/L   AST 17 0 - 40 IU/L   ALT 7 0 - 32 IU/L  Lipid panel   Collection Time: 02/25/24 12:15 PM  Result Value Ref Range   Cholesterol, Total 151 100 - 199 mg/dL   Triglycerides 826 (H) 0 - 149 mg/dL   HDL 57 >60 mg/dL   VLDL  Cholesterol Cal 29 5 - 40 mg/dL   LDL Chol Calc (NIH) 65 0 - 99 mg/dL   Chol/HDL Ratio 2.6 0.0 - 4.4 ratio  Thyroid  Panel With TSH   Collection Time: 02/25/24 12:15 PM  Result Value Ref Range   TSH 0.295 (L) 0.450 - 4.500 uIU/mL   T4, Total 9.2 4.5 - 12.0 ug/dL   T3 Uptake Ratio 29 24 - 39 %   Free Thyroxine Index 2.7 1.2 - 4.9  Anemia Profile B   Collection Time: 02/25/24 12:15 PM  Result Value Ref Range   Total Iron Binding Capacity 266 250 - 450 ug/dL   UIBC 797 881 - 630 ug/dL   Iron 64 27 - 860 ug/dL   Iron Saturation 24 15 - 55 %   Ferritin 60 15 - 150 ng/mL   Vitamin B-12 428 232 - 1,245 pg/mL   Folate 6.5 >3.0 ng/mL   WBC 7.5 3.4 - 10.8 x10E3/uL   RBC 3.88 3.77 - 5.28 x10E6/uL   Hemoglobin 11.5 11.1 - 15.9 g/dL   Hematocrit 63.9 65.9 - 46.6 %   MCV 93 79 - 97 fL   MCH 29.6 26.6 - 33.0 pg   MCHC 31.9 31.5 - 35.7 g/dL   RDW 87.4 88.2 - 84.5 %   Platelets 247 150 - 450 x10E3/uL   Neutrophils 65 Not Estab. %   Lymphs 23 Not Estab. %   Monocytes 11 Not Estab. %   Eos 1 Not Estab. %   Basos 0 Not Estab. %   Neutrophils Absolute 4.8 1.4 - 7.0 x10E3/uL   Lymphocytes Absolute 1.7 0.7 - 3.1 x10E3/uL   Monocytes Absolute 0.8 0.1 - 0.9 x10E3/uL   EOS (ABSOLUTE) 0.1 0.0 - 0.4 x10E3/uL   Basophils Absolute 0.0 0.0 - 0.2 x10E3/uL   Immature Granulocytes 0 Not Estab. %   Immature Grans (Abs) 0.0 0.0 - 0.1 x10E3/uL   Retic Ct Pct 1.1 0.6 - 2.6 %  VITAMIN D  25 Hydroxy (Vit-D Deficiency, Fractures)   Collection Time: 02/25/24 12:15 PM  Result Value Ref Range   Vit D, 25-Hydroxy 36.8 30.0 - 100.0 ng/mL       Pertinent labs & imaging results that were available during my care of the patient were reviewed by me and considered in my medical decision making.  Assessment & Plan:  Zane was seen today for altered mental status and sore.  Diagnoses and all orders for this visit:  Malodorous urine -     CMP14+EGFR -     CBC with Differential/Platelet  Confusion -  Urine  Culture -     Urinalysis, Routine w reflex microscopic -     CMP14+EGFR -     CBC with Differential/Platelet -     Bayer DCA Hb A1c Waived -     TSH -     T4, Free  Moderate late onset Alzheimer's dementia, unspecified whether behavioral, psychotic, or mood disturbance or anxiety (HCC) -     sertraline  (ZOLOFT ) 25 MG tablet; Take 1 tablet (25 mg total) by mouth daily. -     CMP14+EGFR -     CBC with Differential/Platelet -     TSH -     T4, Free  Wound of sacral region, initial encounter -     CMP14+EGFR -     CBC with Differential/Platelet -     Bayer DCA Hb A1c Waived  Low TSH level -     TSH -     T4, Free       Suspected urinary tract infection Malodorous urine for a couple of weeks with worsening confusion, suggestive of a urinary tract infection. No fever or chills reported. - If urinalysis indicates infection, will initiate treatment  Pressure injury of sacral region Pressure injury on the sacral region, improving with current treatment. Described as red like a bead but healing. - Continue application of CeraVe or Vaseline to the sacral pressure injury  Confusion Chronic confusion, exacerbated by suspected urinary tract infection. No fever or chills reported. - Ordered blood work to check calcium , sodium levels, and blood count - Repeated A1c to rule out diabetes as a cause of confusion  General Health Maintenance Discussion about discontinuation of metformin as she is not diabetic. A1c levels consistently below 5.5, indicating no diabetes. - Discontinued metformin - Encouraged follow-up with a new provider for ongoing care          Continue all other maintenance medications.  Follow up plan: Return if symptoms worsen or fail to improve.   Continue healthy lifestyle choices, including diet (rich in fruits, vegetables, and lean proteins, and low in salt and simple carbohydrates) and exercise (at least 30 minutes of moderate physical activity  daily).  Educational handout given for pressure injury   The above assessment and management plan was discussed with the patient. The patient verbalized understanding of and has agreed to the management plan. Patient is aware to call the clinic if they develop any new symptoms or if symptoms persist or worsen. Patient is aware when to return to the clinic for a follow-up visit. Patient educated on when it is appropriate to go to the emergency department.   Rosaline Bruns, FNP-C [facility] [phone]     [1] No Known Allergies  "

## 2024-07-10 ENCOUNTER — Ambulatory Visit: Payer: Self-pay | Admitting: Family Medicine

## 2024-07-10 ENCOUNTER — Telehealth: Payer: Self-pay

## 2024-07-10 ENCOUNTER — Other Ambulatory Visit

## 2024-07-10 DIAGNOSIS — N3090 Cystitis, unspecified without hematuria: Secondary | ICD-10-CM

## 2024-07-10 LAB — CMP14+EGFR
ALT: 19 [IU]/L (ref 0–32)
AST: 28 [IU]/L (ref 0–40)
Albumin: 4.2 g/dL (ref 3.6–4.6)
Alkaline Phosphatase: 65 [IU]/L (ref 48–129)
BUN/Creatinine Ratio: 25 (ref 12–28)
BUN: 25 mg/dL (ref 10–36)
Bilirubin Total: 0.3 mg/dL (ref 0.0–1.2)
CO2: 22 mmol/L (ref 20–29)
Calcium: 9.5 mg/dL (ref 8.7–10.3)
Chloride: 100 mmol/L (ref 96–106)
Creatinine, Ser: 0.99 mg/dL (ref 0.57–1.00)
Globulin, Total: 2.8 g/dL (ref 1.5–4.5)
Glucose: 109 mg/dL — AB (ref 70–99)
Potassium: 4.8 mmol/L (ref 3.5–5.2)
Sodium: 138 mmol/L (ref 134–144)
Total Protein: 7 g/dL (ref 6.0–8.5)
eGFR: 54 mL/min/{1.73_m2} — AB

## 2024-07-10 LAB — CBC WITH DIFFERENTIAL/PLATELET
Basophils Absolute: 0 10*3/uL (ref 0.0–0.2)
Basos: 0 %
EOS (ABSOLUTE): 0.1 10*3/uL (ref 0.0–0.4)
Eos: 1 %
Hematocrit: 37 % (ref 34.0–46.6)
Hemoglobin: 11.8 g/dL (ref 11.1–15.9)
Immature Grans (Abs): 0 10*3/uL (ref 0.0–0.1)
Immature Granulocytes: 0 %
Lymphocytes Absolute: 1.7 10*3/uL (ref 0.7–3.1)
Lymphs: 29 %
MCH: 29.1 pg (ref 26.6–33.0)
MCHC: 31.9 g/dL (ref 31.5–35.7)
MCV: 91 fL (ref 79–97)
Monocytes Absolute: 0.8 10*3/uL (ref 0.1–0.9)
Monocytes: 14 %
Neutrophils Absolute: 3.1 10*3/uL (ref 1.4–7.0)
Neutrophils: 56 %
Platelets: 246 10*3/uL (ref 150–450)
RBC: 4.06 x10E6/uL (ref 3.77–5.28)
RDW: 12.8 % (ref 11.7–15.4)
WBC: 5.6 10*3/uL (ref 3.4–10.8)

## 2024-07-10 LAB — URINALYSIS, ROUTINE W REFLEX MICROSCOPIC
Bilirubin, UA: NEGATIVE
Glucose, UA: NEGATIVE
Ketones, UA: NEGATIVE
Leukocytes,UA: NEGATIVE
Nitrite, UA: NEGATIVE
Protein,UA: NEGATIVE
RBC, UA: NEGATIVE
Urobilinogen, Ur: 0.2 mg/dL (ref 0.2–1.0)
pH, UA: 5.5 (ref 5.0–7.5)

## 2024-07-10 LAB — MICROSCOPIC EXAMINATION
RBC, Urine: NONE SEEN /HPF (ref 0–2)
Renal Epithel, UA: NONE SEEN /HPF
Yeast, UA: NONE SEEN

## 2024-07-10 LAB — TSH: TSH: 0.497 u[IU]/mL (ref 0.450–4.500)

## 2024-07-10 LAB — T4, FREE: Free T4: 1.35 ng/dL (ref 0.82–1.77)

## 2024-07-10 NOTE — Telephone Encounter (Signed)
 Copied from CRM [id number]. Topic: Clinical - Lab/Test Results >> Jul 10, 2024  3:17 PM Selinda RAMAN wrote: Reason for CRM: Alan the daughter of the patient called in due to the fact her sister Albie called in to get her mothers lab results but she is not on the Dpr list. I relayed what the provider stated and she was appreciative. She will have her sister who brings her call to set up a 3 month follow up lab appointment. She would like a nurse to call her to clarify what some of the things the provider stated means like the eGFR? Please assist patient further.

## 2024-07-10 NOTE — Telephone Encounter (Signed)
 Someone already discussed this and did not document. LS

## 2024-07-11 MED ORDER — CEPHALEXIN 500 MG PO CAPS: 500.0000 mg | ORAL_CAPSULE | Freq: Two times a day (BID) | ORAL | 0 refills | Status: DC

## 2024-07-16 LAB — URINE CULTURE

## 2024-07-18 ENCOUNTER — Ambulatory Visit: Admitting: Family Medicine

## 2024-07-18 ENCOUNTER — Encounter: Payer: Self-pay | Admitting: Family Medicine

## 2024-07-18 VITALS — BP 135/78 | HR 68 | Temp 97.1°F | Ht 64.0 in | Wt 117.2 lb

## 2024-07-18 DIAGNOSIS — R202 Paresthesia of skin: Secondary | ICD-10-CM

## 2024-07-18 DIAGNOSIS — I10 Essential (primary) hypertension: Secondary | ICD-10-CM | POA: Insufficient documentation

## 2024-07-18 DIAGNOSIS — G301 Alzheimer's disease with late onset: Secondary | ICD-10-CM

## 2024-07-18 DIAGNOSIS — E559 Vitamin D deficiency, unspecified: Secondary | ICD-10-CM

## 2024-07-18 DIAGNOSIS — E781 Pure hyperglyceridemia: Secondary | ICD-10-CM

## 2024-07-18 DIAGNOSIS — I422 Other hypertrophic cardiomyopathy: Secondary | ICD-10-CM

## 2024-07-18 DIAGNOSIS — F02B0 Dementia in other diseases classified elsewhere, moderate, without behavioral disturbance, psychotic disturbance, mood disturbance, and anxiety: Secondary | ICD-10-CM

## 2024-07-18 DIAGNOSIS — M810 Age-related osteoporosis without current pathological fracture: Secondary | ICD-10-CM | POA: Insufficient documentation

## 2024-07-18 MED ORDER — SERTRALINE HCL 25 MG PO TABS: 25.0000 mg | ORAL_TABLET | Freq: Every day | ORAL | 1 refills | Status: AC

## 2024-07-18 MED ORDER — ALENDRONATE SODIUM 70 MG PO TABS: 70.0000 mg | ORAL_TABLET | ORAL | 3 refills | Status: AC

## 2024-07-18 MED ORDER — DONEPEZIL HCL 5 MG PO TABS: 5.0000 mg | ORAL_TABLET | Freq: Every day | ORAL | 0 refills | Status: AC

## 2024-07-18 NOTE — Progress Notes (Signed)
 "    Subjective:  Patient ID: Debra Villarreal, female    DOB: 06/04/1932, 89 y.o.   MRN: [id number]  Patient Care Team: Severa Rock HERO, FNP as PCP - General (Family Medicine) Lavona Agent, MD as PCP - Cardiology (Cardiology) Shaaron Lamar HERO, MD (Gastroenterology)   Chief Complaint:  Establish Care (Previous [facility] patient ) and Medical Management of Chronic Issues   HPI: Debra Villarreal is a 89 y.o. female presenting on 07/18/2024 for Establish Care (Previous [facility] patient ) and Medical Management of Chronic Issues   Debra Villarreal is a 89 year old female who presents for medication management and follow-up on Alzheimer's disease and anxiety.  She experiences mood disturbances and occasional agitation. She is currently taking sertraline  25 mg daily for her mood and anxiety. There is no formal diagnosis of Alzheimer's, but she has memory issues and anxiety.  She has a history of cardiomyopathy and heart disease but reports no recent issues such as increased shortness of breath. Her last lab work in January showed normal potassium, white blood cell count, and thyroid  function.  She experiences leg cramps, previously managed with potassium supplements, but her potassium levels are currently normal. She also reports numbness and tingling in her hands, ongoing for an unspecified duration. This symptom is not associated with cold temperatures.  Her current medications include sertraline  25 mg daily, simvastatin  5 mg for cholesterol, and Fosamax  70 mg weekly. She is not taking Buspar , which was previously prescribed in 2017 for anxiety and depression.  Her diet is described as good, and she enjoys eating croissants with eggs.          Relevant past medical, surgical, family, and social history reviewed and updated as indicated.  Allergies and medications reviewed and updated. Data reviewed: Chart in Epic.   Past Medical History:  Diagnosis Date   Anxiety    Arthritis     Bilateral cataracts    Hypertension    Takotsubo cardiomyopathy 2007   Normary coronaries 2007, initial EF 18% improved to 60%    Type 2 diabetes mellitus (HCC)    Uterine cancer [facility])     Past Surgical History:  Procedure Laterality Date   ABDOMINAL HYSTERECTOMY     1966   APPENDECTOMY     CATARACT EXTRACTION W/PHACO  10/12/2011   Procedure: CATARACT EXTRACTION PHACO AND INTRAOCULAR LENS PLACEMENT (IOC);  Surgeon: Cherene Mania, MD;  Location: [facility];  Service: Ophthalmology;  Laterality: Left;  CDE:18.57   CATARACT EXTRACTION W/PHACO  10/23/2011   Procedure: CATARACT EXTRACTION PHACO AND INTRAOCULAR LENS PLACEMENT (IOC);  Surgeon: Cherene Mania, MD;  Location: [facility];  Service: Ophthalmology;  Laterality: Right;  CDE 18.09   CHOLECYSTECTOMY N/A 11/19/2020   Procedure: LAPAROSCOPIC CHOLECYSTECTOMY;  Surgeon: Mavis Anes, MD;  Location: [facility];  Service: General;  Laterality: N/A;   COLONOSCOPY  01/31/2012   Procedure: COLONOSCOPY;  Surgeon: Lamar HERO Shaaron, MD;  Location: [facility] SUITE;  Service: Endoscopy;  Laterality: N/A;  1:45   LEFT HEART CATHETERIZATION WITH CORONARY ANGIOGRAM N/A 02/09/2012   Procedure: LEFT HEART CATHETERIZATION WITH CORONARY ANGIOGRAM;  Surgeon: Aleene JINNY Passe, MD;  Location: [facility] CATH LAB;  Service: Cardiovascular;  Laterality: N/A;    Social History   Socioeconomic History   Marital status: Widowed    Spouse name: Not on file   Number of children: Not on file   Years of education: Not on file   Highest education level: Not on file  Occupational History  Not on file  Tobacco Use   Smoking status: Never   Smokeless tobacco: Never  Vaping Use   Vaping status: Never Used  Substance and Sexual Activity   Alcohol use: No   Drug use: No   Sexual activity: Not Currently  Other Topics Concern   Not on file  Social History Narrative   Not on file   Social Drivers of Health   Tobacco Use: Low Risk (07/18/2024)   Patient History    Smoking Tobacco Use: Never     Smokeless Tobacco Use: Never    Passive Exposure: Not on file  Financial Resource Strain: Low Risk (05/08/2022)   Overall Financial Resource Strain (CARDIA)    Difficulty of Paying Living Expenses: Not hard at all  Food Insecurity: Unknown (02/25/2024)   Epic    Worried About Programme Researcher, Broadcasting/film/video in the Last Year: Never true    [employer] of Food in the Last Year: Not on file  Transportation Needs: No Transportation Needs (02/25/2024)   Epic    Lack of Transportation (Medical): No    Lack of Transportation (Non-Medical): No  Physical Activity: Insufficiently Active (05/08/2022)   Exercise Vital Sign    Days of Exercise per Week: 3 days    Minutes of Exercise per Session: 30 min  Stress: No Stress Concern Present (05/08/2022)   [employer] of Occupational Health - Occupational Stress Questionnaire    Feeling of Stress : Not at all  Social Connections: Moderately Isolated (05/08/2022)   Social Connection and Isolation Panel    Frequency of Communication with Friends and Family: More than three times a week    Frequency of Social Gatherings with Friends and Family: More than three times a week    Attends Religious Services: 1 to 4 times per year    Active Member of [employer] or Organizations: No    Attends Banker Meetings: Never    Marital Status: Widowed  Intimate Partner Violence: Unknown (02/25/2024)   Epic    Fear of Current or Ex-Partner: Not on file    Emotionally Abused: No    Physically Abused: No    Sexually Abused: No  Depression (PHQ2-9): Medium Risk (07/18/2024)   Depression (PHQ2-9)    PHQ-2 Score: 9  Alcohol Screen: Low Risk (05/08/2022)   Alcohol Screen    Last Alcohol Screening Score (AUDIT): 0  Housing: Unknown (02/25/2024)   Epic    Unable to Pay for Housing in the Last Year: No    Number of Times Moved in the Last Year: Not on file    Homeless in the Last Year: No  Utilities: Not At Risk (05/08/2022)   [facility] Utilities    Threatened with loss of  utilities: No  Health Literacy: Not on file    Outpatient Encounter Medications as of 07/18/2024  Medication Sig   acetaminophen  (TYLENOL ) 325 MG tablet Take 325 mg by mouth every 6 (six) hours as needed for mild pain or moderate pain.    aspirin  EC 81 MG tablet Take 81 mg by mouth every 6 (six) hours as needed for moderate pain.   benazepril  (LOTENSIN ) 5 MG tablet TAKE 1 TABLET BY MOUTH DAILY   Calcium  Citrate-Vitamin D  (CALCIUM  + D PO) Take 1 tablet by mouth 2 (two) times daily.   donepezil  (ARICEPT ) 5 MG tablet Take 1 tablet (5 mg total) by mouth at bedtime.   Flaxseed, Linseed, 1000 MG CAPS Take by mouth.   glucose blood test strip Use  as instructed   Lancets (ONETOUCH ULTRASOFT) lancets Use as instructed   Omega-3 Fatty Acids (FISH OIL) 300 MG CAPS Take by mouth.   simvastatin  (ZOCOR ) 5 MG tablet Take 1 tablet (5 mg total) by mouth daily. Take 5 mg by mouth daily.   [DISCONTINUED] alendronate  (FOSAMAX ) 70 MG tablet TAKE 1 TABLET BY MOUTH ONCE A WEEK FOR  BONE  HEALTH   [DISCONTINUED] busPIRone  (BUSPAR ) 5 MG tablet Take 1 tablet (5 mg total) by mouth 2 (two) times daily.   [DISCONTINUED] cephALEXin  (KEFLEX ) 500 MG capsule Take 1 capsule (500 mg total) by mouth 2 (two) times daily for 7 days.   [DISCONTINUED] metFORMIN (GLUCOPHAGE) 500 MG tablet Take 500 mg by mouth daily.   alendronate  (FOSAMAX ) 70 MG tablet Take 1 tablet (70 mg total) by mouth once a week. Take with a full glass of water  on an empty stomach.   sertraline  (ZOLOFT ) 25 MG tablet Take 1 tablet (25 mg total) by mouth daily.   [DISCONTINUED] sertraline  (ZOLOFT ) 25 MG tablet Take 1 tablet (25 mg total) by mouth daily.   No facility-administered encounter medications on file as of 07/18/2024.    Allergies[1]  Pertinent ROS per HPI, otherwise unremarkable      Objective:  BP 135/78   Pulse 68   Temp (!) 97.1 F (36.2 C)   Ht 5' 4 (1.626 m)   Wt 117 lb 3.2 oz (53.2 kg)   SpO2 97%   BMI 20.12 kg/m    Wt Readings  from Last 3 Encounters:  07/18/24 117 lb 3.2 oz (53.2 kg)  07/09/24 117 lb 12.8 oz (53.4 kg)  02/25/24 118 lb (53.5 kg)    Physical Exam Vitals and nursing note reviewed.  Constitutional:      General: She is not in acute distress.    Appearance: She is ill-appearing (chronically ill, frail elderly). She is not toxic-appearing or diaphoretic.  HENT:     Head: Normocephalic and atraumatic.     Nose: Nose normal.     Mouth/Throat:     Mouth: Mucous membranes are moist.     Pharynx: Oropharynx is clear.  Eyes:     Conjunctiva/sclera: Conjunctivae normal.     Pupils: Pupils are equal, round, and reactive to light.  Cardiovascular:     Rate and Rhythm: Normal rate and regular rhythm.     Heart sounds: Murmur heard.     Systolic murmur is present.  Pulmonary:     Effort: Pulmonary effort is normal.     Breath sounds: Normal breath sounds.  Musculoskeletal:     Cervical back: Neck supple.     Right lower leg: No edema.     Left lower leg: No edema.  Skin:    General: Skin is warm and dry.     Capillary Refill: Capillary refill takes less than 2 seconds.  Neurological:     General: No focal deficit present.     Mental Status: She is alert. Mental status is at baseline.     Cranial Nerves: Cranial nerves 2-12 are intact. No cranial nerve deficit.     Sensory: Sensation is intact. No sensory deficit.     Motor: Motor function is intact. No weakness.     Coordination: Coordination normal.     Gait: Gait normal.     Deep Tendon Reflexes: Reflexes normal.  Psychiatric:        Mood and Affect: Mood normal.        Speech: Speech normal.  Behavior: Behavior is cooperative.        Cognition and Memory: Cognition is impaired. Memory is impaired.       Results for orders placed or performed in visit on 07/09/24  Bayer DCA Hb A1c Waived   Collection Time: 07/09/24 12:31 PM  Result Value Ref Range   HB A1C (BAYER DCA - WAIVED) 5.3 4.8 - 5.6 %  CMP14+EGFR   Collection Time:  07/09/24 12:33 PM  Result Value Ref Range   Glucose 109 (H) 70 - 99 mg/dL   BUN 25 10 - 36 mg/dL   Creatinine, Ser 9.00 0.57 - 1.00 mg/dL   eGFR 54 (L) >40 fO/fpw/8.26   BUN/Creatinine Ratio 25 12 - 28   Sodium 138 134 - 144 mmol/L   Potassium 4.8 3.5 - 5.2 mmol/L   Chloride 100 96 - 106 mmol/L   CO2 22 20 - 29 mmol/L   Calcium  9.5 8.7 - 10.3 mg/dL   Total Protein 7.0 6.0 - 8.5 g/dL   Albumin 4.2 3.6 - 4.6 g/dL   Globulin, Total 2.8 1.5 - 4.5 g/dL   Bilirubin Total 0.3 0.0 - 1.2 mg/dL   Alkaline Phosphatase 65 48 - 129 IU/L   AST 28 0 - 40 IU/L   ALT 19 0 - 32 IU/L  CBC with Differential/Platelet   Collection Time: 07/09/24 12:33 PM  Result Value Ref Range   WBC 5.6 3.4 - 10.8 x10E3/uL   RBC 4.06 3.77 - 5.28 x10E6/uL   Hemoglobin 11.8 11.1 - 15.9 g/dL   Hematocrit 62.9 65.9 - 46.6 %   MCV 91 79 - 97 fL   MCH 29.1 26.6 - 33.0 pg   MCHC 31.9 31.5 - 35.7 g/dL   RDW 87.1 88.2 - 84.5 %   Platelets 246 150 - 450 x10E3/uL   Neutrophils 56 Not Estab. %   Lymphs 29 Not Estab. %   Monocytes 14 Not Estab. %   Eos 1 Not Estab. %   Basos 0 Not Estab. %   Neutrophils Absolute 3.1 1.4 - 7.0 x10E3/uL   Lymphocytes Absolute 1.7 0.7 - 3.1 x10E3/uL   Monocytes Absolute 0.8 0.1 - 0.9 x10E3/uL   EOS (ABSOLUTE) 0.1 0.0 - 0.4 x10E3/uL   Basophils Absolute 0.0 0.0 - 0.2 x10E3/uL   Immature Granulocytes 0 Not Estab. %   Immature Grans (Abs) 0.0 0.0 - 0.1 x10E3/uL  TSH   Collection Time: 07/09/24 12:33 PM  Result Value Ref Range   TSH 0.497 0.450 - 4.500 uIU/mL  T4, Free   Collection Time: 07/09/24 12:33 PM  Result Value Ref Range   Free T4 1.35 0.82 - 1.77 ng/dL  Urine Culture   Collection Time: 07/10/24  2:07 PM   Specimen: Urine   UR  Result Value Ref Range   Urine Culture, Routine Final report (A)    Organism ID, Bacteria Lactobacillus species    ORGANISM ID, BACTERIA Escherichia coli (A)    Antimicrobial Susceptibility Comment   Microscopic Examination   Collection Time:  07/10/24  2:07 PM   Urine  Result Value Ref Range   WBC, UA 0-5 0 - 5 /hpf   RBC, Urine None seen 0 - 2 /hpf   Epithelial Cells (non renal) 0-10 0 - 10 /hpf   Renal Epithel, UA None seen None seen /hpf   Bacteria, UA Many (A) None seen/Few   Yeast, UA None seen None seen  Urinalysis, Routine w reflex microscopic   Collection Time: 07/10/24  2:07 PM  Result Value Ref Range   Specific Gravity, UA      >=1.030 (A) 1.005 - 1.030   pH, UA 5.5 5.0 - 7.5   Color, UA Yellow Yellow   Appearance Ur Cloudy (A) Clear   Leukocytes,UA Negative Negative   Protein,UA Negative Negative/Trace   Glucose, UA Negative Negative   Ketones, UA Negative Negative   RBC, UA Negative Negative   Bilirubin, UA Negative Negative   Urobilinogen, Ur 0.2 0.2 - 1.0 mg/dL   Nitrite, UA Negative Negative   Microscopic Examination See below:        Pertinent labs & imaging results that were available during my care of the patient were reviewed by me and considered in my medical decision making.  Assessment & Plan:  Enisa was seen today for establish care and medical management of chronic issues.  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 -     donepezil  (ARICEPT ) 5 MG tablet; Take 1 tablet (5 mg total) by mouth at bedtime. -     Anemia Profile B -     VITAMIN D  25 Hydroxy (Vit-D Deficiency, Fractures) -     CMP14+EGFR -     TSH -     T4, Free  Other hypertrophic cardiomyopathy (HCC) -     Anemia Profile B -     Lipid panel -     CMP14+EGFR  Age-related osteoporosis without current pathological fracture -     alendronate  (FOSAMAX ) 70 MG tablet; Take 1 tablet (70 mg total) by mouth once a week. Take with a full glass of water  on an empty stomach. -     VITAMIN D  25 Hydroxy (Vit-D Deficiency, Fractures) -     CMP14+EGFR  Paresthesia of  hand, bilateral -     Anemia Profile B -     VITAMIN D  25 Hydroxy (Vit-D Deficiency, Fractures) -     CMP14+EGFR  Vitamin D  deficiency -     VITAMIN D  25 Hydroxy (Vit-D Deficiency, Fractures) -     CMP14+EGFR  Hypertriglyceridemia -     Lipid panel -     CMP14+EGFR  Primary hypertension -     Anemia Profile B -     Lipid panel -     CMP14+EGFR -     TSH -     T4, Free        Moderate late onset Alzheimer's dementia with behavioral disturbance Moderate late onset Alzheimer's dementia with behavioral disturbances, including agitation and mood disturbances. Currently on sertraline  for mood and anxiety, but not taking buspirone  as previously prescribed.  - Started donepezil  (Aricept ) once daily at bedtime. - Refilled sertraline  prescription. - Discontinued buspirone  from medication list.  Other hypertrophic cardiomyopathy No current issues with shortness of breath or other symptoms related to cardiomyopathy. Blood pressure medications are well-managed. - Continue current blood pressure medications.  Age-related osteoporosis Managed with weekly Fosamax . - Continue Fosamax  70 mg weekly.  Paresthesia of hands Paresthesia in hands, possibly related to vitamin B12 deficiency. Symptoms include numbness and tingling, not associated with cold exposure. - Ordered blood work to check vitamin B12 levels.        I spent 40 minutes dedicated to the care of this patient on the date of this encounter to include pre-visit review of records including diagnostic studies and labs,  face-to-face time with the patient discussing above conditions, post visit ordering of testing, clinical documentation in the electronic health record, making appropriate referrals as documented, and communicating necessary information to the patient's healthcare team.    Continue all other maintenance medications.  Follow up plan: Return in about 8 weeks (around 09/12/2024), or if symptoms worsen or fail to  improve, for dementia.   Continue healthy lifestyle choices, including diet (rich in fruits, vegetables, and lean proteins, and low in salt and simple carbohydrates) and exercise (at least 30 minutes of moderate physical activity daily).   The above assessment and management plan was discussed with the patient. The patient verbalized understanding of and has agreed to the management plan. Patient is aware to call the clinic if they develop any new symptoms or if symptoms persist or worsen. Patient is aware when to return to the clinic for a follow-up visit. Patient educated on when it is appropriate to go to the emergency department.   Rosaline Bruns, FNP-C [facility] [phone]     [1] No Known Allergies  "

## 2024-09-11 ENCOUNTER — Ambulatory Visit: Admitting: Family Medicine
